# Patient Record
Sex: Male | Born: 1970 | Race: White | Hispanic: No | Marital: Single | Smoking: Former smoker
Health system: Southern US, Community
[De-identification: ages and names within clinical notes are randomized; demographics above are authoritative.]

## PROBLEM LIST (undated history)

## (undated) DIAGNOSIS — I10 Essential (primary) hypertension: Secondary | ICD-10-CM

## (undated) DIAGNOSIS — J449 Chronic obstructive pulmonary disease, unspecified: Secondary | ICD-10-CM

## (undated) DIAGNOSIS — I029 Rheumatic chorea without heart involvement: Secondary | ICD-10-CM

## (undated) DIAGNOSIS — I739 Peripheral vascular disease, unspecified: Secondary | ICD-10-CM

## (undated) DIAGNOSIS — J45909 Unspecified asthma, uncomplicated: Secondary | ICD-10-CM

## (undated) HISTORY — DX: Rheumatic chorea without heart involvement: I02.9

## (undated) HISTORY — DX: Essential (primary) hypertension: I10

## (undated) HISTORY — DX: Unspecified asthma, uncomplicated: J45.909

## (undated) HISTORY — DX: Peripheral vascular disease, unspecified: I73.9

## (undated) HISTORY — DX: Chronic obstructive pulmonary disease, unspecified: J44.9

## (undated) HISTORY — PX: FRACTURE SURGERY: SHX138

## (undated) HISTORY — PX: APPENDECTOMY: SHX54

---

## 2009-03-24 ENCOUNTER — Emergency Department (HOSPITAL_COMMUNITY): Admission: EM | Admit: 2009-03-24 | Discharge: 2009-03-24 | Payer: Self-pay | Admitting: Emergency Medicine

## 2012-11-27 ENCOUNTER — Ambulatory Visit: Payer: Self-pay | Admitting: Family Medicine

## 2013-08-19 ENCOUNTER — Ambulatory Visit: Payer: Self-pay | Admitting: Family Medicine

## 2013-08-20 ENCOUNTER — Ambulatory Visit (INDEPENDENT_AMBULATORY_CARE_PROVIDER_SITE_OTHER): Payer: 59 | Admitting: Family Medicine

## 2013-08-20 ENCOUNTER — Encounter: Payer: Self-pay | Admitting: Family Medicine

## 2013-08-20 VITALS — BP 115/62 | HR 96 | Temp 98.2°F | Ht 77.0 in | Wt 206.2 lb

## 2013-08-20 DIAGNOSIS — I10 Essential (primary) hypertension: Secondary | ICD-10-CM

## 2013-08-20 DIAGNOSIS — J45909 Unspecified asthma, uncomplicated: Secondary | ICD-10-CM

## 2013-08-20 DIAGNOSIS — J069 Acute upper respiratory infection, unspecified: Secondary | ICD-10-CM

## 2013-08-20 LAB — POCT URINALYSIS DIPSTICK
Bilirubin, UA: NEGATIVE
Blood, UA: NEGATIVE
Glucose, UA: NEGATIVE
Ketones, UA: NEGATIVE
Leukocytes, UA: NEGATIVE
Nitrite, UA: NEGATIVE
Protein, UA: NEGATIVE
Spec Grav, UA: 1.01
Urobilinogen, UA: NEGATIVE
pH, UA: 5

## 2013-08-20 LAB — POCT UA - MICROSCOPIC ONLY
Bacteria, U Microscopic: NEGATIVE
Casts, Ur, LPF, POC: NEGATIVE
Crystals, Ur, HPF, POC: NEGATIVE
Mucus, UA: NEGATIVE
RBC, urine, microscopic: NEGATIVE
WBC, Ur, HPF, POC: NEGATIVE
Yeast, UA: NEGATIVE

## 2013-08-20 MED ORDER — LISINOPRIL-HYDROCHLOROTHIAZIDE 20-12.5 MG PO TABS: 1.0000 | ORAL_TABLET | Freq: Every day | ORAL | Status: DC

## 2013-08-20 MED ORDER — AZITHROMYCIN 250 MG PO TABS: ORAL_TABLET | ORAL | Status: DC

## 2013-08-20 MED ORDER — FLUTICASONE-SALMETEROL 250-50 MCG/DOSE IN AEPB: 1.0000 | INHALATION_SPRAY | Freq: Two times a day (BID) | RESPIRATORY_TRACT | Status: DC

## 2013-08-20 NOTE — Progress Notes (Signed)
   Subjective:    Patient ID: Timothy Nielsen, male    DOB: September 19, 1970, 43 y.o.   MRN: [id number]  HPI This 43 y.o. male presents for evaluation of establishment visit.  He has hx of hypertension. He has hx of asthma.  He has been having cough and uri sx's.  He is a smoker. He states He was told by his prior PCP that he had some glucose in his urine but normal serum glucose Levels.   Review of Systems No chest pain, SOB, HA, dizziness, vision change, N/V, diarrhea, constipation, dysuria, urinary urgency or frequency, myalgias, arthralgias or rash.     Objective:   Physical Exam  Vital signs noted  Well developed well nourished male.  HEENT - Head atraumatic Normocephalic                Eyes - PERRLA, Conjuctiva - clear Sclera- Clear EOMI                Ears - EAC's Wnl TM's Wnl Gross Hearing WNL                Nose - Nares patent                 Throat - oropharanx wnl Respiratory - Lungs CTA bilateral Cardiac - RRR S1 and S2 without murmur GI - Abdomen soft Nontender and bowel sounds active x 4 Extremities - No edema. Neuro - Grossly intact.      Assessment & Plan:  Essential hypertension, benign - Plan: lisinopril-hydrochlorothiazide (PRINZIDE,ZESTORETIC) 20-12.5 MG per tablet, POCT UA - Microscopic Only, POCT urinalysis dipstick, BMP8+EGFR  Extrinsic asthma, unspecified - Plan: Fluticasone-Salmeterol (ADVAIR) 250-50 MCG/DOSE AEPB  URI (upper respiratory infection) - Plan: azithromycin (ZITHROMAX) 250 MG tablet  Tobacco abuse - Advised patient to stop smoking.  Hx of Glucosuria - Check UA  Lysbeth Penner FNP

## 2013-08-21 LAB — BMP8+EGFR
BUN/Creatinine Ratio: 14 (ref 9–20)
BUN: 12 mg/dL (ref 6–24)
CO2: 25 mmol/L (ref 18–29)
Calcium: 9.8 mg/dL (ref 8.7–10.2)
Chloride: 100 mmol/L (ref 97–108)
Creatinine, Ser: 0.88 mg/dL (ref 0.76–1.27)
GFR calc Af Amer: 121 mL/min/{1.73_m2} (ref >59)
GFR calc non Af Amer: 104 mL/min/{1.73_m2} (ref >59)
Glucose: 83 mg/dL (ref 65–99)
Potassium: 4.5 mmol/L (ref 3.5–5.2)
Sodium: 139 mmol/L (ref 134–144)

## 2013-08-23 ENCOUNTER — Other Ambulatory Visit: Payer: Self-pay | Admitting: Family Medicine

## 2013-08-23 ENCOUNTER — Telehealth: Payer: Self-pay | Admitting: Family Medicine

## 2013-08-23 MED ORDER — BECLOMETHASONE DIPROPIONATE 80 MCG/ACT IN AERS: 1.0000 | INHALATION_SPRAY | Freq: Once | RESPIRATORY_TRACT | Status: DC

## 2013-08-23 NOTE — Telephone Encounter (Signed)
Qvar sent to pharmacy and try this instead of the advair

## 2014-01-31 ENCOUNTER — Ambulatory Visit (INDEPENDENT_AMBULATORY_CARE_PROVIDER_SITE_OTHER): Payer: 59 | Admitting: Family Medicine

## 2014-01-31 ENCOUNTER — Encounter (INDEPENDENT_AMBULATORY_CARE_PROVIDER_SITE_OTHER): Payer: Self-pay

## 2014-01-31 ENCOUNTER — Ambulatory Visit (INDEPENDENT_AMBULATORY_CARE_PROVIDER_SITE_OTHER): Payer: 59

## 2014-01-31 ENCOUNTER — Encounter: Payer: Self-pay | Admitting: Family Medicine

## 2014-01-31 VITALS — BP 121/77 | HR 59 | Temp 96.0°F | Ht 77.0 in | Wt 205.0 lb

## 2014-01-31 DIAGNOSIS — J069 Acute upper respiratory infection, unspecified: Secondary | ICD-10-CM

## 2014-01-31 DIAGNOSIS — R05 Cough: Secondary | ICD-10-CM

## 2014-01-31 DIAGNOSIS — J45909 Unspecified asthma, uncomplicated: Secondary | ICD-10-CM

## 2014-01-31 DIAGNOSIS — J4530 Mild persistent asthma, uncomplicated: Secondary | ICD-10-CM

## 2014-01-31 DIAGNOSIS — R059 Cough, unspecified: Secondary | ICD-10-CM

## 2014-01-31 IMAGING — CR DG CHEST 2V
3 series · 3 of 3 positions shown · non-contrast
Comparison: None.

CLINICAL DATA: Cough, shortness of breath, history of asthma

EXAM:
CHEST  2 VIEW

[view not recorded (1 of 3)]
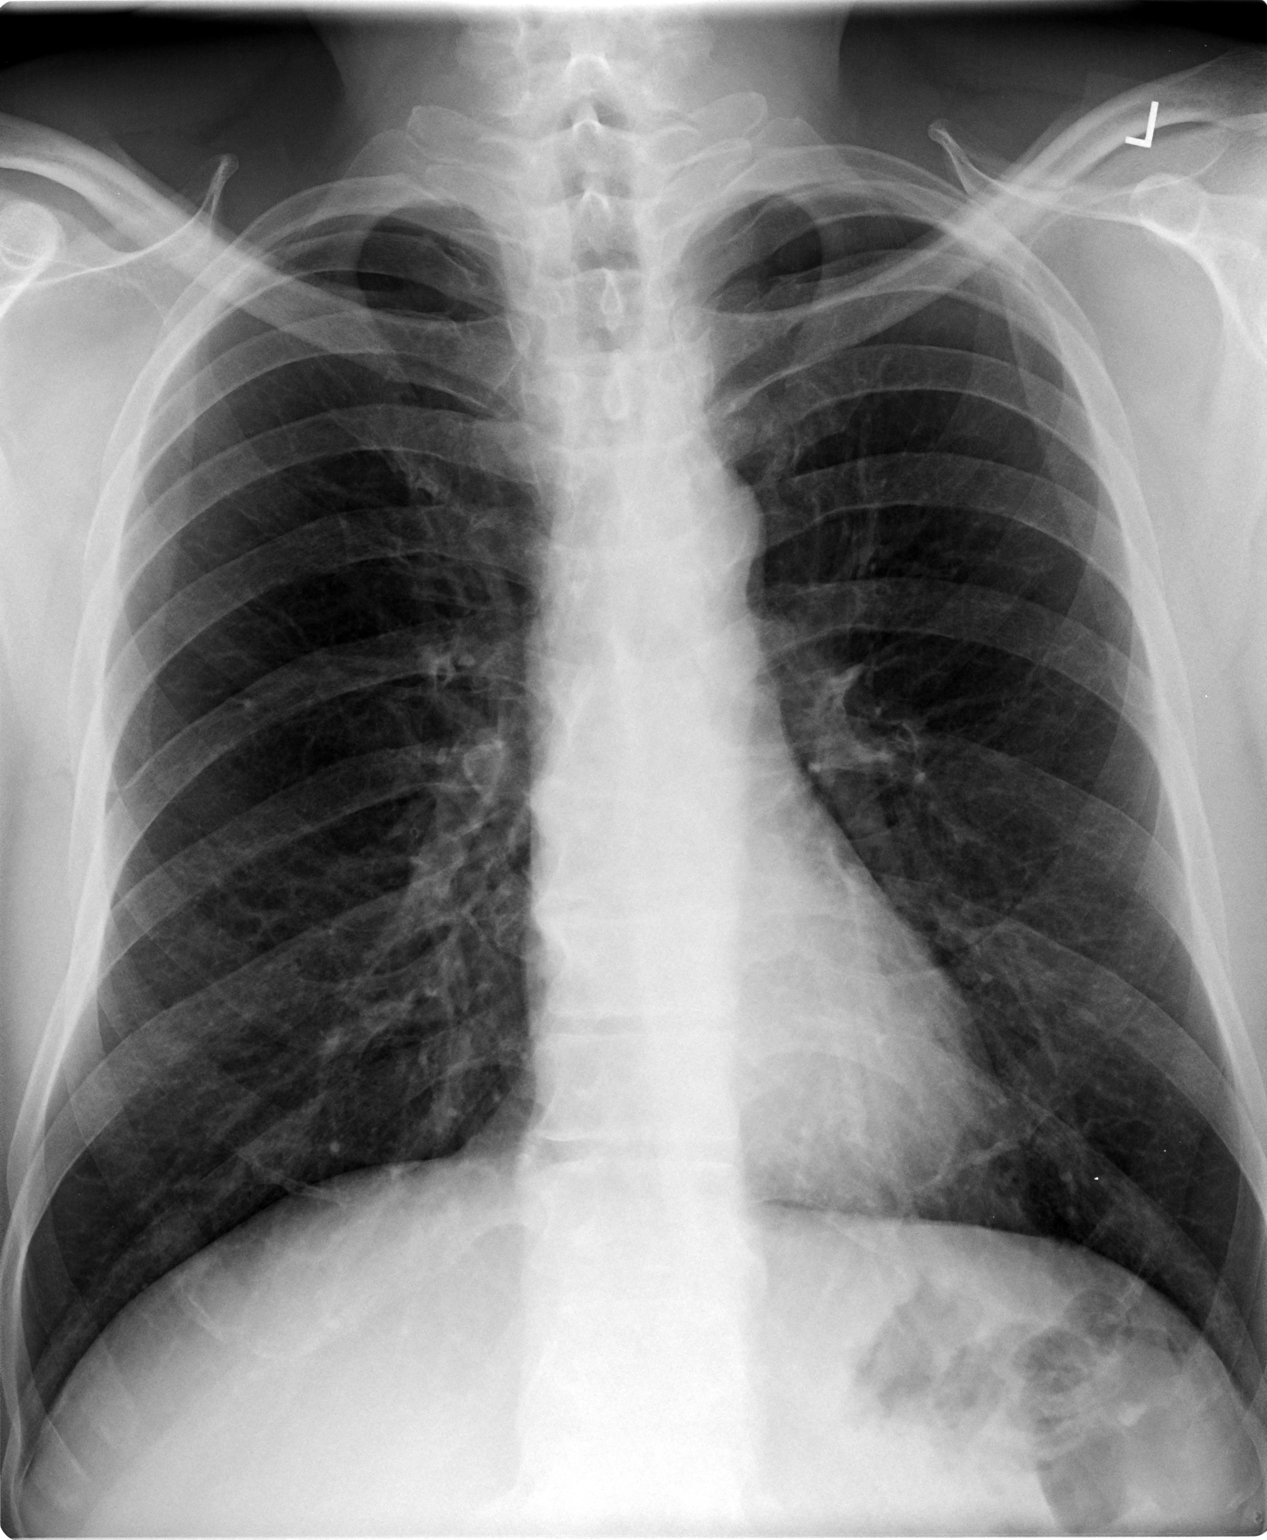

[view not recorded (2 of 3)]
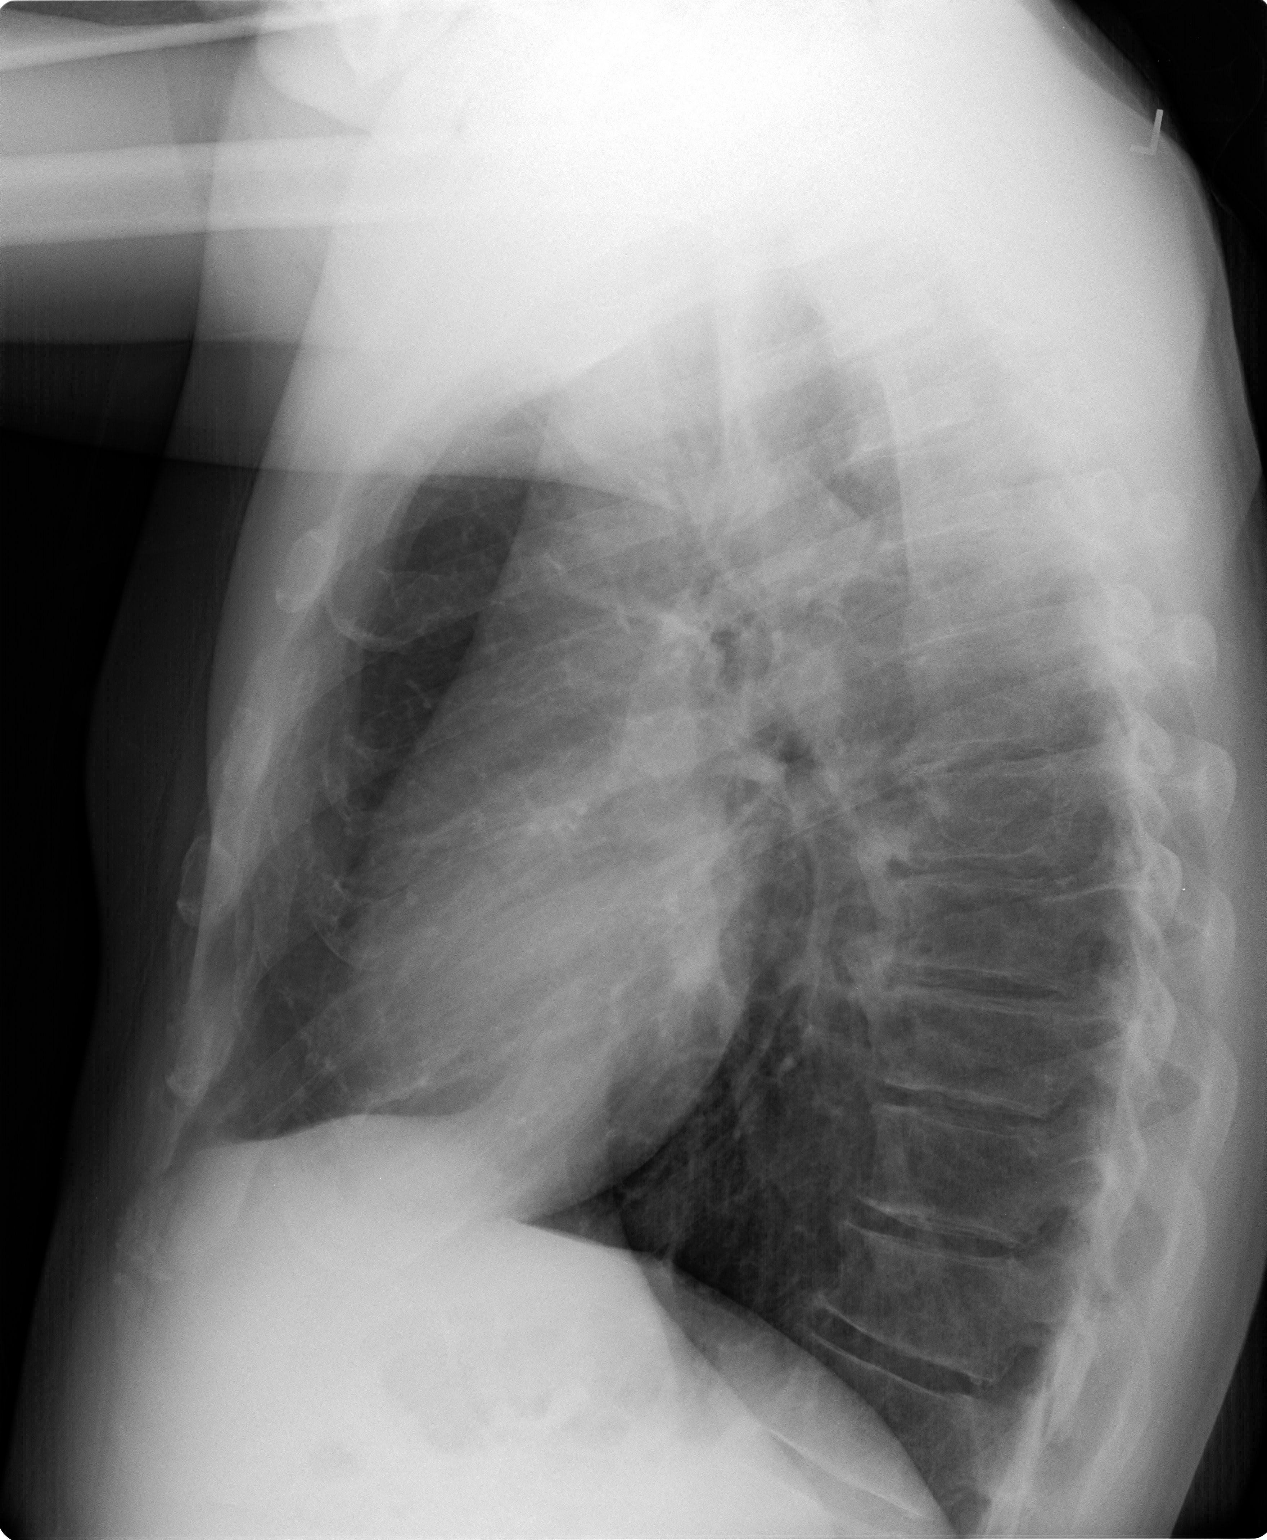

[view not recorded (3 of 3)]
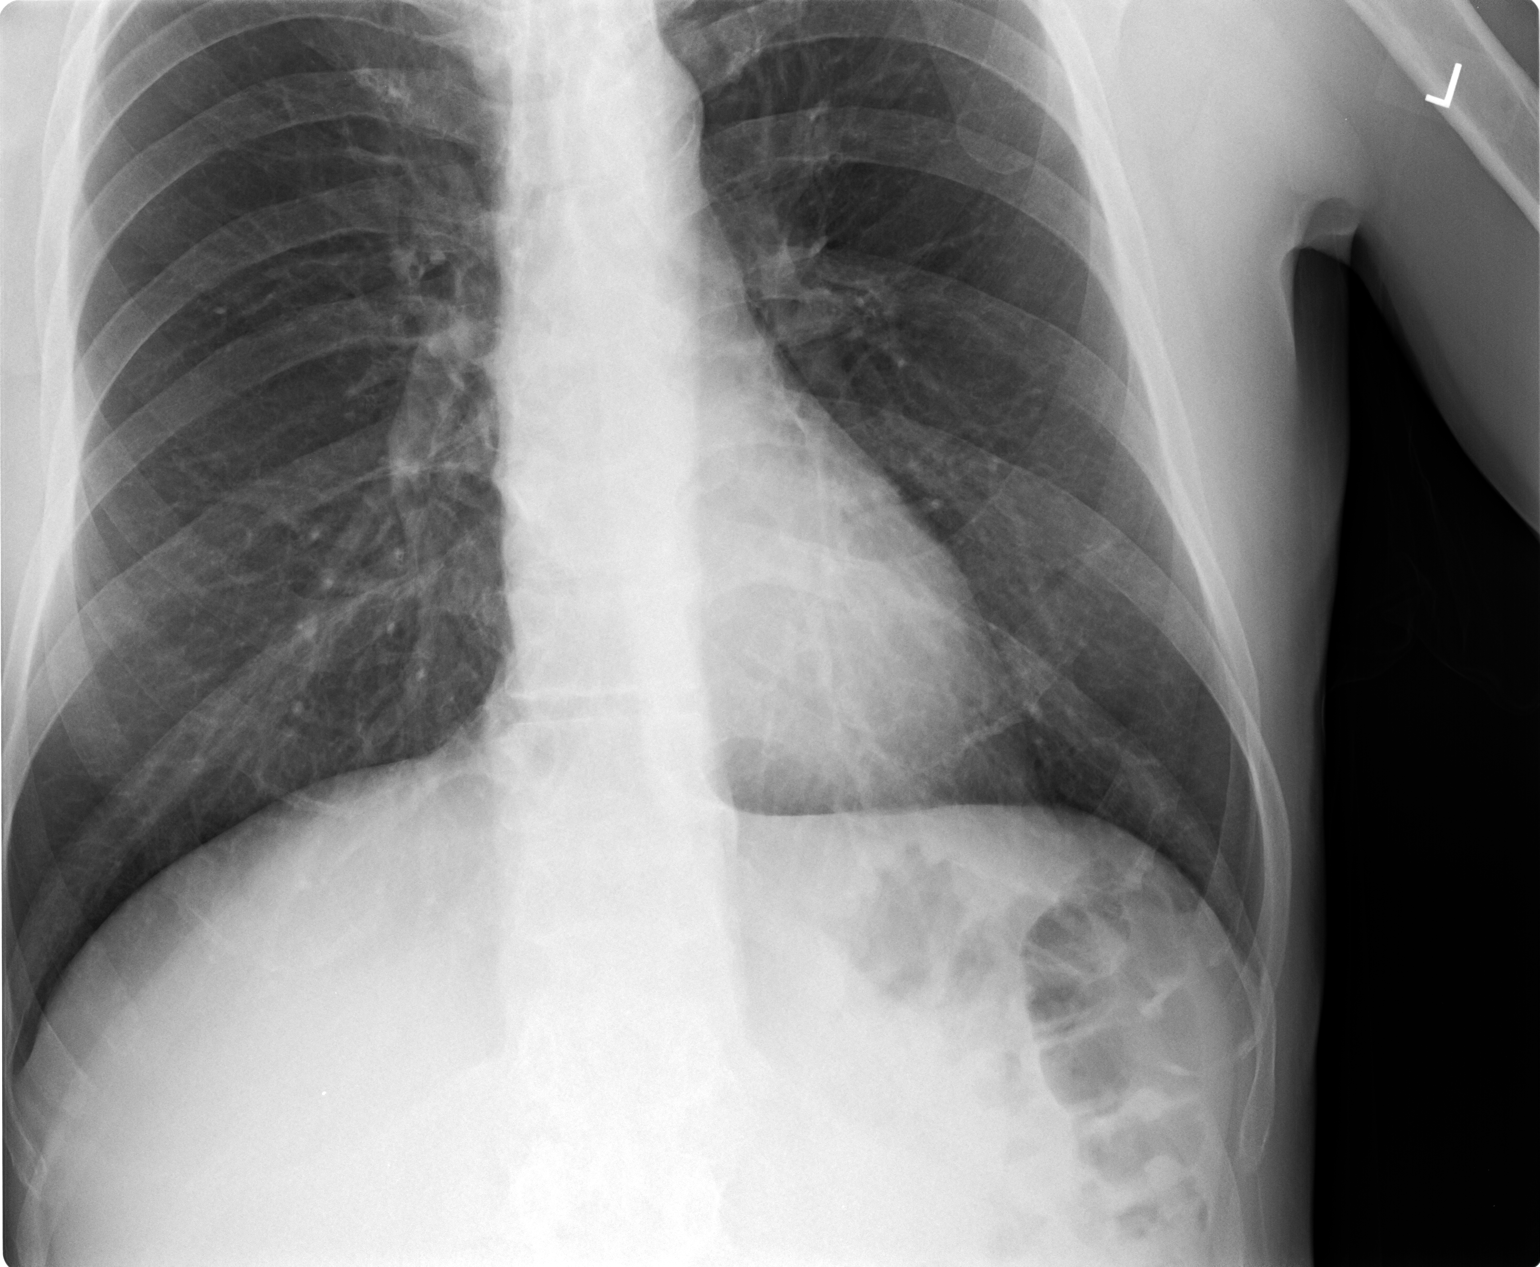

[3 of 3 positions shown; findings below may reference images not displayed]

FINDINGS: No active infiltrate or effusion is seen. Minimal peribronchial
thickening is noted. Mediastinal and hilar contours are
unremarkable. The heart is within normal limits in size. No bony
abnormality is seen.
IMPRESSION: No active infiltrate or effusion.  Mild peribronchial thickening.

## 2014-01-31 MED ORDER — MONTELUKAST SODIUM 10 MG PO TABS: 10.0000 mg | ORAL_TABLET | Freq: Every day | ORAL | Status: DC

## 2014-01-31 MED ORDER — AZITHROMYCIN 250 MG PO TABS: ORAL_TABLET | ORAL | Status: DC

## 2014-01-31 MED ORDER — METHYLPREDNISOLONE (PAK) 4 MG PO TABS: ORAL_TABLET | ORAL | Status: DC

## 2014-01-31 MED ORDER — BENZONATATE 100 MG PO CAPS: 100.0000 mg | ORAL_CAPSULE | Freq: Three times a day (TID) | ORAL | Status: DC | PRN

## 2014-01-31 MED ORDER — ALBUTEROL SULFATE HFA 108 (90 BASE) MCG/ACT IN AERS: 2.0000 | INHALATION_SPRAY | RESPIRATORY_TRACT | Status: DC | PRN

## 2014-01-31 NOTE — Progress Notes (Signed)
   Subjective:    Patient ID: Timothy Nielsen, male    DOB: 1971/06/13, 43 y.o.   MRN: [id number]  HPI  This 43 y.o. male presents for evaluation of shortness of breath.  He has been coughing and he has been wheezing and having a lot of night time coughing and wheezing.  Review of Systems C/o sob and cough   No chest pain, HA, dizziness, vision change, N/V, diarrhea, constipation, dysuria, urinary urgency or frequency, myalgias, arthralgias or rash.  Objective:   Physical Exam  Vital signs noted  Well developed well nourished male.  HEENT - Head atraumatic Normocephalic                Eyes - PERRLA, Conjuctiva - clear Sclera- Clear EOMI                Ears - EAC's Wnl TM's Wnl Gross Hearing WNL                Nose - Nares patent                 Throat - oropharanx wnl Respiratory - Lungs CTA bilateral Cardiac - RRR S1 and S2 without murmur GI - Abdomen soft Nontender and bowel sounds active x 4 Extremities - No edema. Neuro - Grossly intact.      Assessment & Plan:  Cough - Plan: DG Chest 2 View, montelukast (SINGULAIR) 10 MG tablet, azithromycin (ZITHROMAX) 250 MG tablet, methylPREDNIsolone (MEDROL DOSPACK) 4 MG tablet, albuterol (PROVENTIL HFA;VENTOLIN HFA) 108 (90 BASE) MCG/ACT inhaler  URI (upper respiratory infection) - Plan: montelukast (SINGULAIR) 10 MG tablet, azithromycin (ZITHROMAX) 250 MG tablet, methylPREDNIsolone (MEDROL DOSPACK) 4 MG tablet, albuterol (PROVENTIL HFA;VENTOLIN HFA) 108 (90 BASE) MCG/ACT inhaler, benzonatate (TESSALON PERLES) 100 MG capsule  Asthma, mild persistent, uncomplicated - Plan: montelukast (SINGULAIR) 10 MG tablet, azithromycin (ZITHROMAX) 250 MG tablet, methylPREDNIsolone (MEDROL DOSPACK) 4 MG tablet, albuterol (PROVENTIL HFA;VENTOLIN HFA) 108 (90 BASE) MCG/ACT inhaler  Push po fluids, rest, tylenol and motrin otc prn as directed for fever, arthralgias, and myalgias.  Follow up prn if sx's continue or persist.  Deatra CanterWilliam J Oxford FNP

## 2014-09-09 ENCOUNTER — Telehealth: Payer: Self-pay | Admitting: Family

## 2014-09-09 DIAGNOSIS — I10 Essential (primary) hypertension: Secondary | ICD-10-CM

## 2014-09-09 MED ORDER — LISINOPRIL-HYDROCHLOROTHIAZIDE 20-12.5 MG PO TABS: 1.0000 | ORAL_TABLET | Freq: Every day | ORAL | Status: DC

## 2014-09-09 NOTE — Telephone Encounter (Signed)
done

## 2014-09-29 ENCOUNTER — Ambulatory Visit (INDEPENDENT_AMBULATORY_CARE_PROVIDER_SITE_OTHER): Payer: Self-pay | Admitting: Family

## 2014-09-29 ENCOUNTER — Encounter: Payer: Self-pay | Admitting: Family

## 2014-09-29 VITALS — BP 127/84 | HR 87 | Temp 97.0°F | Ht 77.0 in | Wt 216.4 lb

## 2014-09-29 DIAGNOSIS — I10 Essential (primary) hypertension: Secondary | ICD-10-CM

## 2014-09-29 DIAGNOSIS — M5412 Radiculopathy, cervical region: Secondary | ICD-10-CM

## 2014-09-29 MED ORDER — KETOROLAC TROMETHAMINE 60 MG/2ML IM SOLN
60.0000 mg | Freq: Once | INTRAMUSCULAR | Status: AC
  Administered 2014-09-29: 60 mg via INTRAMUSCULAR

## 2014-09-29 MED ORDER — LISINOPRIL-HYDROCHLOROTHIAZIDE 20-12.5 MG PO TABS: 1.0000 | ORAL_TABLET | Freq: Every day | ORAL | Status: DC

## 2014-09-29 MED ORDER — METHYLPREDNISOLONE ACETATE 80 MG/ML IJ SUSP
80.0000 mg | Freq: Once | INTRAMUSCULAR | Status: AC
  Administered 2014-09-29: 80 mg via INTRAMUSCULAR

## 2014-09-29 NOTE — Progress Notes (Signed)
Subjective:    Patient ID: Timothy Nielsen, male    DOB: Apr 27, 1971, 44 y.o.   MRN: [id number]  Hypertension This is a chronic problem. The current episode started more than 1 year ago. The problem has been resolved since onset. The problem is controlled. Associated symptoms include neck pain. Pertinent negatives include no anxiety, headaches, palpitations, peripheral edema, PND or shortness of breath. Risk factors for coronary artery disease include male gender, obesity and smoking/tobacco exposure. Past treatments include ACE inhibitors and diuretics. The current treatment provides moderate improvement. There is no history of kidney disease, CAD/MI, CVA, heart failure or a thyroid problem. There is no history of sleep apnea.  Asthma There is no cough, hemoptysis, shortness of breath or wheezing. This is a chronic problem. The current episode started more than 1 year ago. The problem occurs rarely. The problem has been resolved. Pertinent negatives include no dyspnea on exertion, ear pain, headaches, myalgias, PND, sore throat or trouble swallowing. His symptoms are alleviated by rest. He reports significant improvement on treatment. His symptoms are not alleviated by rest. His past medical history is significant for asthma.  Neck Pain  This is a new problem. The current episode started 1 to 4 weeks ago. The problem occurs constantly. The problem has been unchanged. The pain is present in the left side. The quality of the pain is described as aching. The pain is at a severity of 5/10. The pain is moderate. The symptoms are aggravated by twisting. Pertinent negatives include no headaches or trouble swallowing. He has tried chiropractic manipulation for the symptoms. The treatment provided moderate relief.      Review of Systems  Constitutional: Negative.   HENT: Negative for ear pain, sore throat and trouble swallowing.   Respiratory: Negative.  Negative for cough, hemoptysis, shortness of breath and  wheezing.   Cardiovascular: Negative.  Negative for dyspnea on exertion, palpitations and PND.  Gastrointestinal: Negative.   Endocrine: Negative.   Genitourinary: Negative.   Musculoskeletal: Positive for neck pain. Negative for myalgias.  Neurological: Negative.  Negative for headaches.  Hematological: Negative.   Psychiatric/Behavioral: Negative.   All other systems reviewed and are negative.      Objective:   Physical Exam  Constitutional: He is oriented to person, place, and time. He appears well-developed and well-nourished. No distress.  HENT:  Head: Normocephalic.  Right Ear: External ear normal.  Left Ear: External ear normal.  Nose: Nose normal.  Mouth/Throat: Oropharynx is clear and moist.  Eyes: Pupils are equal, round, and reactive to light. Right eye exhibits no discharge. Left eye exhibits no discharge.  Neck: Normal range of motion. Neck supple. No thyromegaly present.  Cardiovascular: Normal rate, regular rhythm, normal heart sounds and intact distal pulses.   No murmur heard. Pulmonary/Chest: Effort normal and breath sounds normal. No respiratory distress. He has no wheezes.  Abdominal: Soft. Bowel sounds are normal. He exhibits no distension. There is no tenderness.  Musculoskeletal: Normal range of motion. He exhibits no edema or tenderness.  Neurological: He is alert and oriented to person, place, and time. He has normal reflexes. No cranial nerve deficit.  Skin: Skin is warm and dry. No rash noted. No erythema.  Psychiatric: He has a normal mood and affect. His behavior is normal. Judgment and thought content normal.  Vitals reviewed.     BP 127/84 mmHg  Pulse 87  Temp(Src) 97 F (36.1 C) (Oral)  Ht '6\' 5"'  (1.956 m)  Wt 216 lb 6.4 oz (98.158  kg)  BMI 25.66 kg/m2     Assessment & Plan:  1. Essential hypertension, benign -Dash diet information given -Exercise encouraged - Stress Management  -Continue current meds -RTO in 1 year -  lisinopril-hydrochlorothiazide (PRINZIDE,ZESTORETIC) 20-12.5 MG per tablet; Take 1 tablet by mouth daily.  Dispense: 90 tablet; Refill: 3 - BMP8+EGFR   3. Cervical radiculopathy -Rest -Continue with chiropractor  - BMP8+EGFR - ketorolac (TORADOL) injection 60 mg; Inject 2 mLs (60 mg total) into the muscle once. - methylPREDNISolone acetate (DEPO-MEDROL) injection 80 mg; Inject 1 mL (80 mg total) into the muscle once.   Continue all meds Labs pending Health Maintenance reviewed Diet and exercise encouraged RTO 1 year  Evelina Dun, FNP

## 2014-09-29 NOTE — Patient Instructions (Signed)

## 2015-11-14 ENCOUNTER — Telehealth: Payer: Self-pay | Admitting: Family

## 2015-11-14 DIAGNOSIS — I10 Essential (primary) hypertension: Secondary | ICD-10-CM

## 2015-11-14 MED ORDER — LISINOPRIL-HYDROCHLOROTHIAZIDE 20-12.5 MG PO TABS: 1.0000 | ORAL_TABLET | Freq: Every day | ORAL | Status: DC

## 2015-11-14 NOTE — Telephone Encounter (Signed)
Detailed message left for patient that rx as been filled and he needs to be seen.

## 2015-11-14 NOTE — Telephone Encounter (Signed)
Will refill for one month. PT needs appt!

## 2015-11-14 NOTE — Telephone Encounter (Signed)
Last seen 10/12/14

## 2016-06-16 ENCOUNTER — Encounter (HOSPITAL_COMMUNITY): Payer: Self-pay | Admitting: Emergency Medicine

## 2016-06-16 ENCOUNTER — Emergency Department (HOSPITAL_COMMUNITY)
Admission: EM | Admit: 2016-06-16 | Discharge: 2016-06-16 | Disposition: A | Discharge status: Home / Self Care | Payer: Self-pay | Attending: Emergency Medicine | Admitting: Emergency Medicine

## 2016-06-16 DIAGNOSIS — Z79899 Other long term (current) drug therapy: Secondary | ICD-10-CM | POA: Insufficient documentation

## 2016-06-16 DIAGNOSIS — H6500 Acute serous otitis media, unspecified ear: Secondary | ICD-10-CM

## 2016-06-16 DIAGNOSIS — H6592 Unspecified nonsuppurative otitis media, left ear: Secondary | ICD-10-CM | POA: Insufficient documentation

## 2016-06-16 DIAGNOSIS — I10 Essential (primary) hypertension: Secondary | ICD-10-CM | POA: Insufficient documentation

## 2016-06-16 DIAGNOSIS — F17210 Nicotine dependence, cigarettes, uncomplicated: Secondary | ICD-10-CM | POA: Insufficient documentation

## 2016-06-16 DIAGNOSIS — J45909 Unspecified asthma, uncomplicated: Secondary | ICD-10-CM | POA: Insufficient documentation

## 2016-06-16 MED ORDER — AMOXICILLIN 500 MG PO CAPS: 500.0000 mg | ORAL_CAPSULE | Freq: Three times a day (TID) | ORAL | 0 refills | Status: DC

## 2016-06-16 MED ORDER — HYDROCODONE-ACETAMINOPHEN 5-325 MG PO TABS: 1.0000 | ORAL_TABLET | Freq: Four times a day (QID) | ORAL | 0 refills | Status: DC | PRN

## 2016-06-16 MED ORDER — HYDROCODONE-ACETAMINOPHEN 5-325 MG PO TABS
1.0000 | ORAL_TABLET | Freq: Once | ORAL | Status: AC
  Administered 2016-06-16: 1 via ORAL
  Filled 2016-06-16: qty 1

## 2016-06-16 NOTE — ED Triage Notes (Signed)
Started with cold symptoms this pass Wed.  Tried OTC meds with not relief.  Here today c/o left ear pain, rating pain 10/10.  Denies any fever or chills.

## 2016-06-16 NOTE — Discharge Instructions (Signed)
Follow up with your md next week. °

## 2016-06-16 NOTE — ED Provider Notes (Signed)
[facility]Y [facility]PT Provider Note   CSN: [id number] Arrival date & time: 06/16/16  1444     History   Chief Complaint Chief Complaint  Patient presents with  . Otalgia    HPI Timothy Nielsen is a 45 y.o. male.   Pt complains of an earache in left ear for 2 days.  Also cold symptoms   The history is provided by the patient. No language interpreter was used.  Otalgia  This is a new problem. The current episode started yesterday. There is pain in the left ear. The problem occurs constantly. The problem has not changed since onset.There has been no fever. The pain is at a severity of 7/10. The pain is moderate. Pertinent negatives include no ear discharge, no headaches, no abdominal pain, no diarrhea, no cough and no rash.    Past Medical History:  Diagnosis Date  . Asthma   . Hypertension     Patient Active Problem List   Diagnosis Date Noted  . Essential hypertension 09/29/2014    Past Surgical History:  Procedure Laterality Date  . APPENDECTOMY    . FRACTURE SURGERY         Home Medications    Prior to Admission medications   Medication Sig Start Date End Date Taking? Authorizing Provider  albuterol (PROVENTIL HFA;VENTOLIN HFA) 108 (90 BASE) MCG/ACT inhaler Inhale 2 puffs into the lungs every 4 (four) hours as needed for wheezing or shortness of breath. Patient not taking: Reported on 09/29/2014 01/31/14   Deatra CanterWilliam J Oxford, FNP  amoxicillin (AMOXIL) 500 MG capsule Take 1 capsule (500 mg total) by mouth 3 (three) times daily. 06/16/16   Bethann BerkshireJoseph Annaleah Arata, MD  beclomethasone (QVAR) 80 MCG/ACT inhaler Inhale 1 puff into the lungs once. Patient not taking: Reported on 09/29/2014 08/23/13   Deatra CanterWilliam J Oxford, FNP  HYDROcodone-acetaminophen (NORCO/VICODIN) 5-325 MG tablet Take 1 tablet by mouth every 6 (six) hours as needed for moderate pain. 06/16/16   Bethann BerkshireJoseph Chevis Weisensel, MD  lisinopril-hydrochlorothiazide (PRINZIDE,ZESTORETIC) 20-12.5 MG tablet Take 1 tablet by mouth daily. 11/14/15    Junie Spencerhristy A Hawks, FNP  montelukast (SINGULAIR) 10 MG tablet Take 1 tablet (10 mg total) by mouth at bedtime. Patient not taking: Reported on 09/29/2014 01/31/14   Deatra CanterWilliam J Oxford, FNP    Family History Family History  Problem Relation Age of Onset  . Diabetes Mother   . Hypertension Mother   . Diabetes Father     Social History Social History  Substance Use Topics  . Smoking status: Current Every Day Smoker    Packs/day: 1.00    Types: Cigarettes    Start date: 11/14/1986  . Smokeless tobacco: Not on file  . Alcohol use 9.0 oz/week    18 Standard drinks or equivalent per week     Allergies   Patient has no known allergies.   Review of Systems Review of Systems  Constitutional: Negative for appetite change and fatigue.  HENT: Positive for ear pain. Negative for congestion, ear discharge and sinus pressure.        Runny nose  Eyes: Negative for discharge.  Respiratory: Negative for cough.   Cardiovascular: Negative for chest pain.  Gastrointestinal: Negative for abdominal pain and diarrhea.  Genitourinary: Negative for frequency and hematuria.  Musculoskeletal: Negative for back pain.  Skin: Negative for rash.  Neurological: Negative for seizures and headaches.  Psychiatric/Behavioral: Negative for hallucinations.     Physical Exam Updated Vital Signs BP 153/77 (BP Location: Left Arm)   Pulse 86  Temp 98 F (36.7 C) (Oral)   Resp 19   Ht 6\' 6"  (1.981 m)   Wt 250 lb (113.4 kg)   SpO2 99%   BMI 28.89 kg/m   Physical Exam  Constitutional: He is oriented to person, place, and time. He appears well-developed.  HENT:  Head: Normocephalic.  Left TM inflamed  Eyes: Conjunctivae are normal.  Neck: No tracheal deviation present.  Cardiovascular:  No murmur heard. Musculoskeletal: Normal range of motion.  Neurological: He is oriented to person, place, and time.  Skin: Skin is warm.  Psychiatric: He has a normal mood and affect.     ED Treatments / Results    Labs (all labs ordered are listed, but only abnormal results are displayed) Labs Reviewed - No data to display  EKG  EKG Interpretation None       Radiology No results found.  Procedures Procedures (including critical care time)  Medications Ordered in ED Medications  HYDROcodone-acetaminophen (NORCO/VICODIN) 5-325 MG per tablet 1 tablet (not administered)     Initial Impression / Assessment and Plan / ED Course  I have reviewed the triage vital signs and the nursing notes.  Pertinent labs & imaging results that were available during my care of the patient were reviewed by me and considered in my medical decision making (see chart for details).  Clinical Course     Left otitis media. Patient will be treated with amoxicillin and Vicodin  Final Clinical Impressions(s) / ED Diagnoses   Final diagnoses:  Acute serous otitis media, recurrence not specified, unspecified laterality    New Prescriptions New Prescriptions   AMOXICILLIN (AMOXIL) 500 MG CAPSULE    Take 1 capsule (500 mg total) by mouth 3 (three) times daily.   HYDROCODONE-ACETAMINOPHEN (NORCO/VICODIN) 5-325 MG TABLET    Take 1 tablet by mouth every 6 (six) hours as needed for moderate pain.     Bethann BerkshireJoseph Christien Berthelot, MD 06/16/16 [phone]

## 2016-11-21 ENCOUNTER — Ambulatory Visit (INDEPENDENT_AMBULATORY_CARE_PROVIDER_SITE_OTHER): Payer: Self-pay | Admitting: Family

## 2016-11-21 ENCOUNTER — Other Ambulatory Visit: Payer: Self-pay | Admitting: Family

## 2016-11-21 ENCOUNTER — Ambulatory Visit (INDEPENDENT_AMBULATORY_CARE_PROVIDER_SITE_OTHER): Payer: Self-pay

## 2016-11-21 ENCOUNTER — Encounter: Payer: Self-pay | Admitting: Family

## 2016-11-21 VITALS — BP 109/65 | HR 77 | Temp 97.7°F | Ht 78.0 in | Wt 214.8 lb

## 2016-11-21 DIAGNOSIS — J45909 Unspecified asthma, uncomplicated: Secondary | ICD-10-CM | POA: Insufficient documentation

## 2016-11-21 DIAGNOSIS — F17200 Nicotine dependence, unspecified, uncomplicated: Secondary | ICD-10-CM | POA: Insufficient documentation

## 2016-11-21 DIAGNOSIS — K219 Gastro-esophageal reflux disease without esophagitis: Secondary | ICD-10-CM

## 2016-11-21 DIAGNOSIS — I10 Essential (primary) hypertension: Secondary | ICD-10-CM

## 2016-11-21 DIAGNOSIS — R062 Wheezing: Secondary | ICD-10-CM

## 2016-11-21 DIAGNOSIS — J449 Chronic obstructive pulmonary disease, unspecified: Secondary | ICD-10-CM

## 2016-11-21 DIAGNOSIS — I700 Atherosclerosis of aorta: Secondary | ICD-10-CM

## 2016-11-21 DIAGNOSIS — R079 Chest pain, unspecified: Secondary | ICD-10-CM

## 2016-11-21 IMAGING — DX DG CHEST 2V
3 series · 3 of 3 positions shown · non-contrast
Comparison: [DATE]

CLINICAL DATA: Chest pain

EXAM:
CHEST  2 VIEW

[chest pa (1 of 2)]
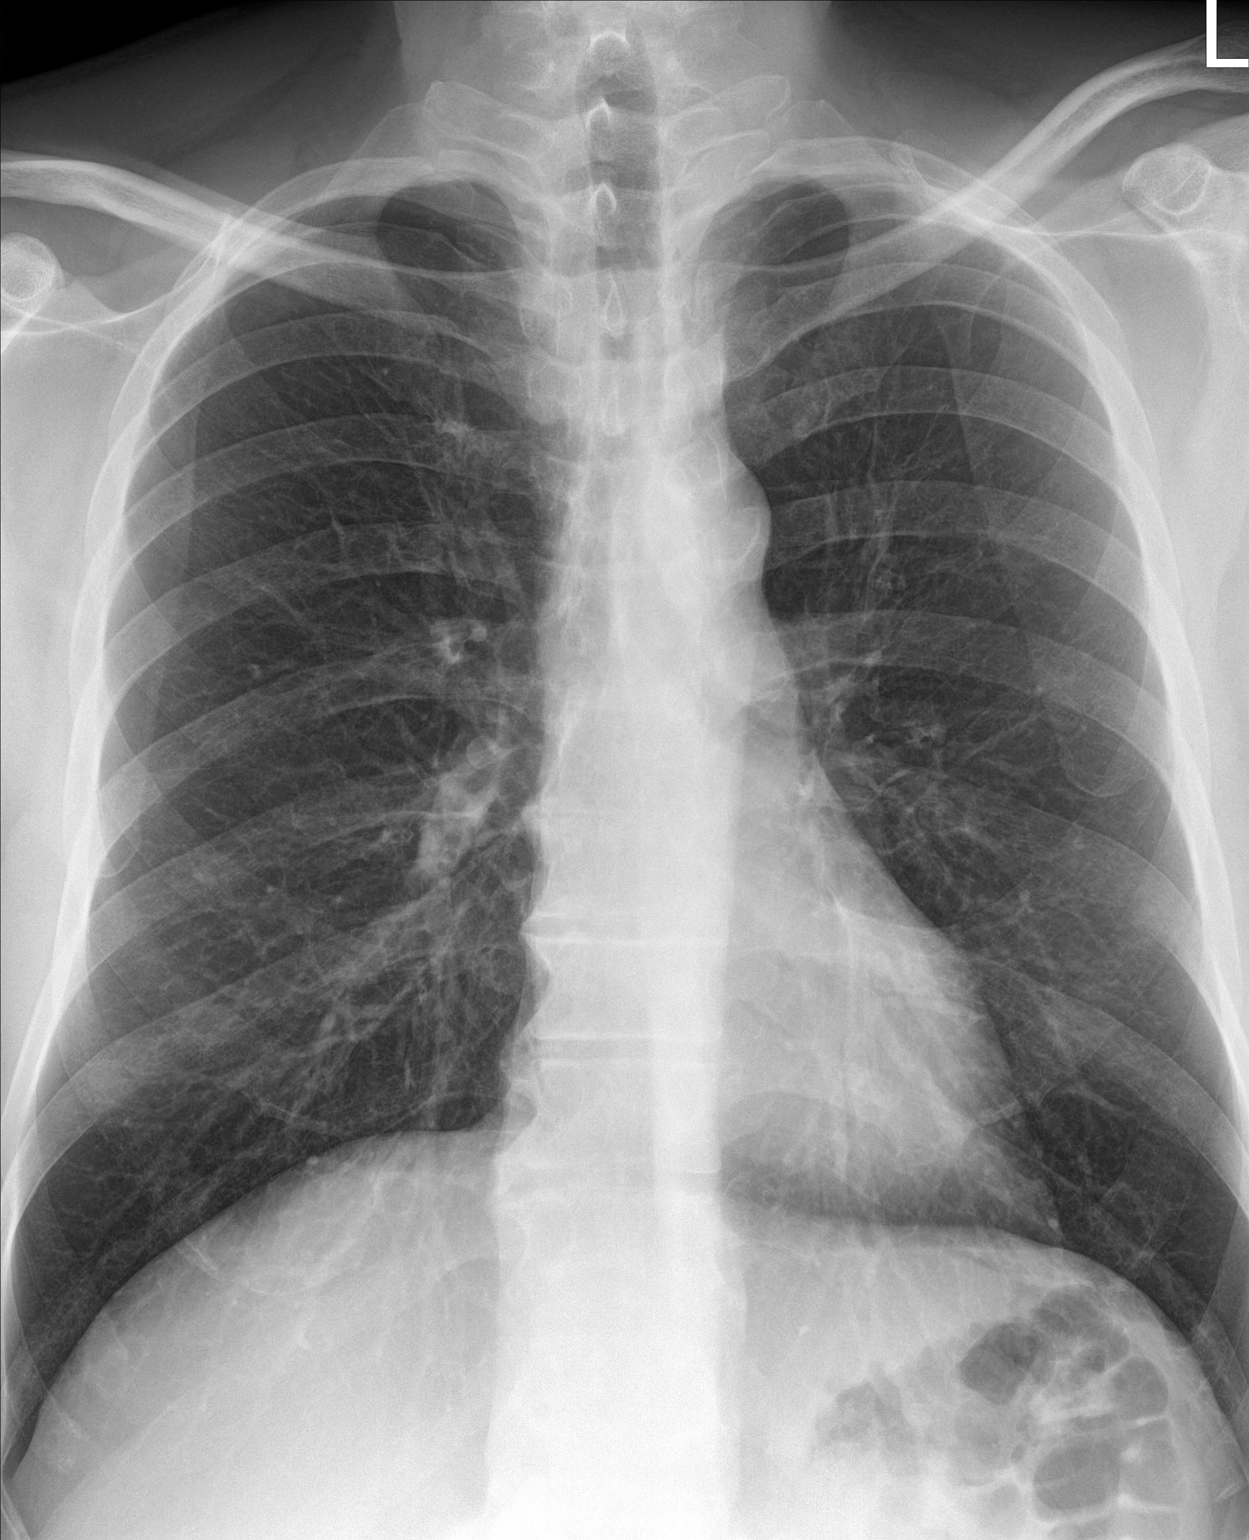

[chest lat]
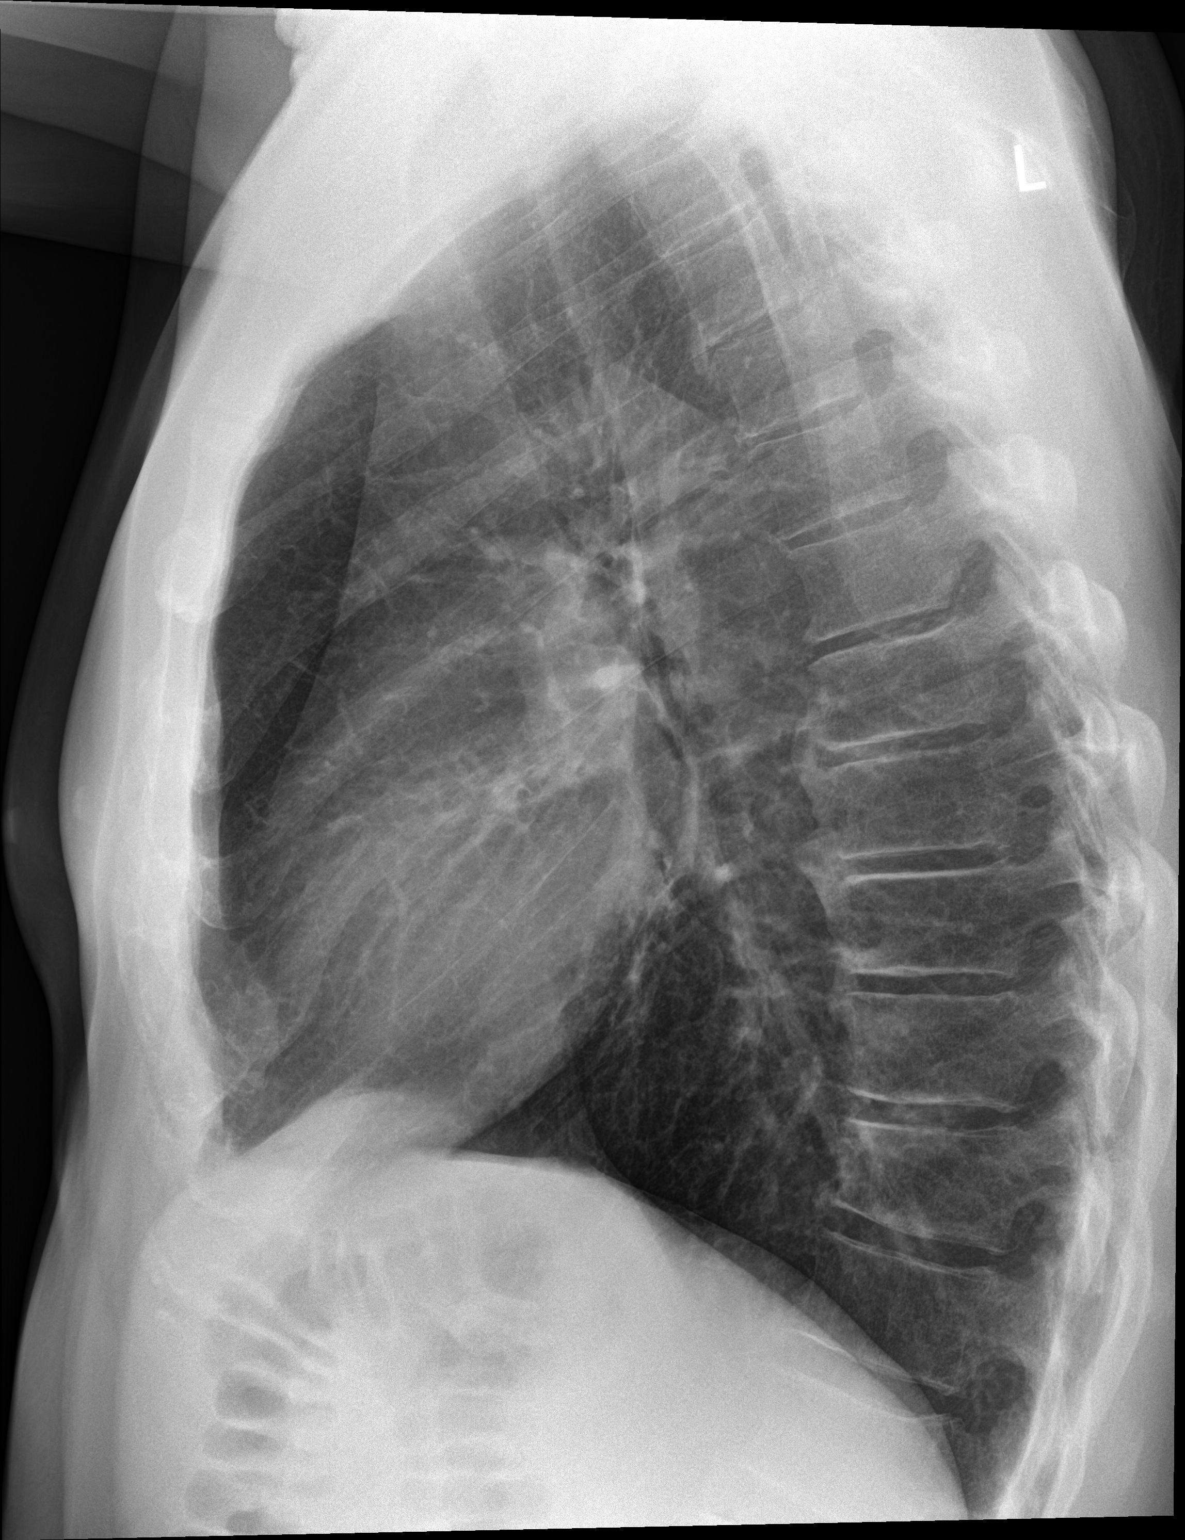

[chest pa (2 of 2)]
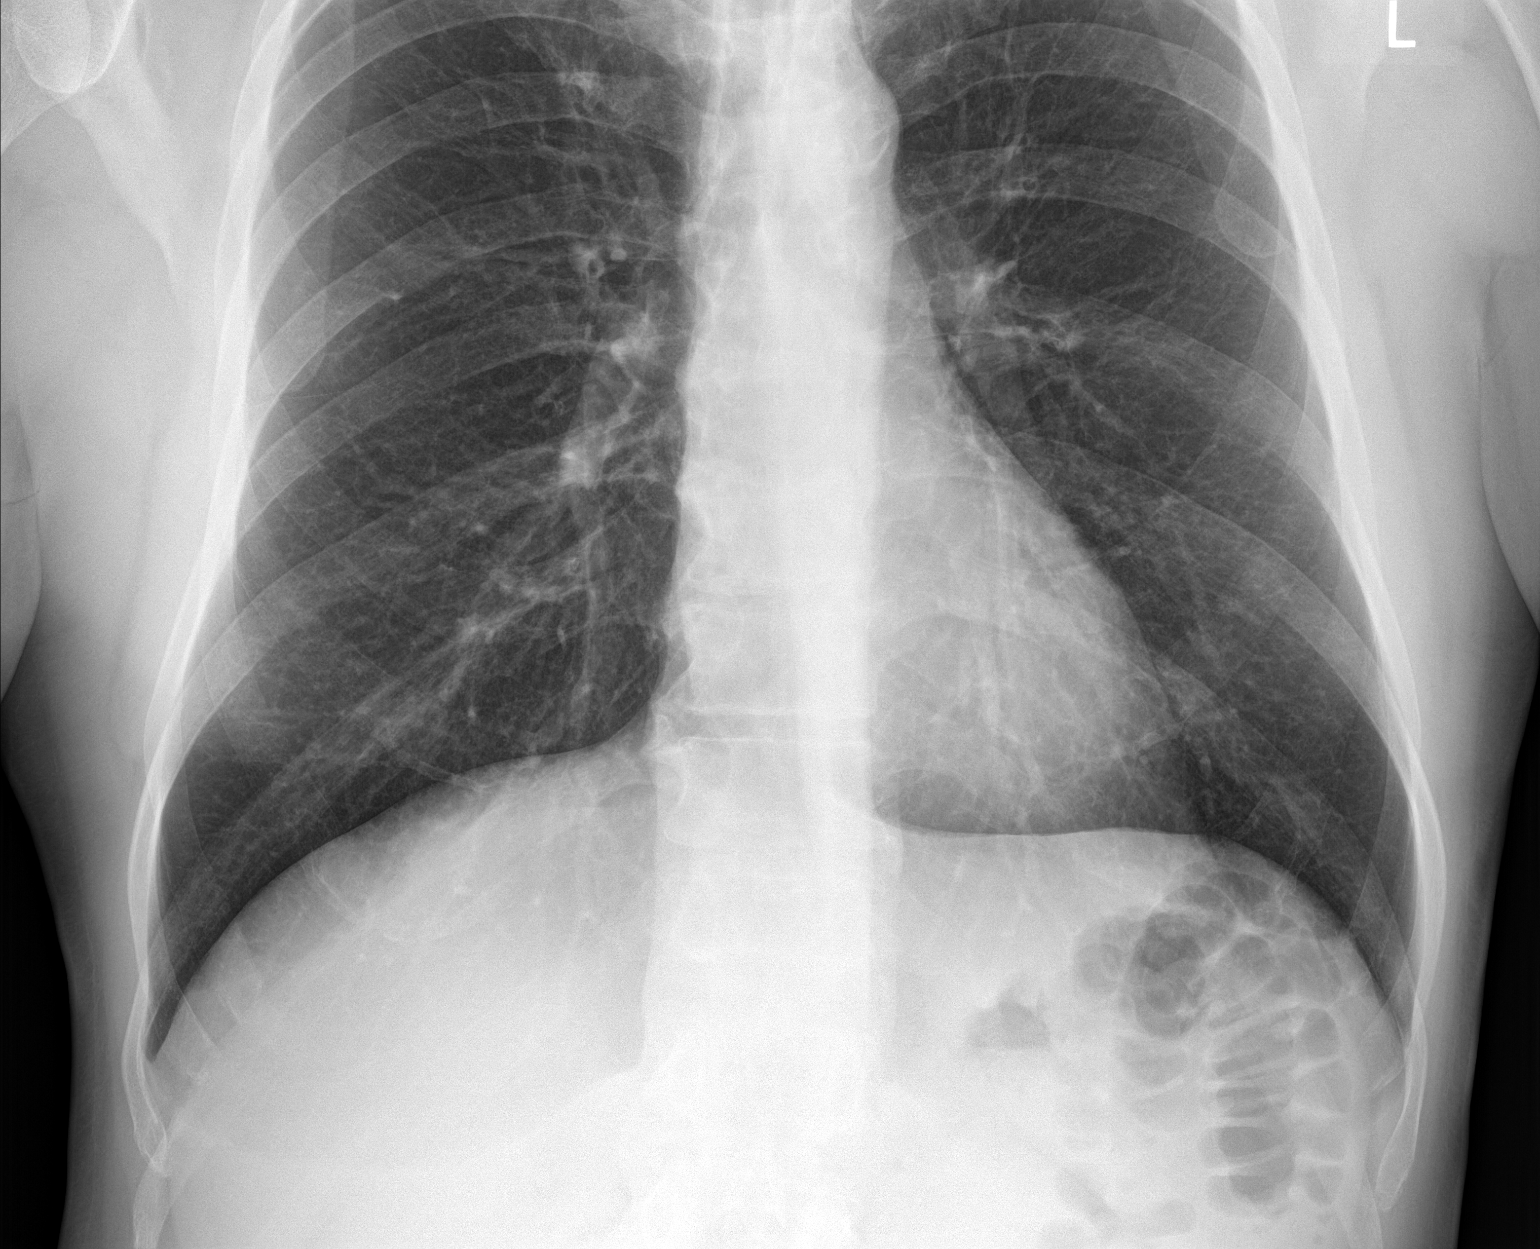

[3 of 3 positions shown; findings below may reference images not displayed]

FINDINGS: There is no edema or consolidation. The heart size and pulmonary
vascularity are normal. No adenopathy. There is aortic
atherosclerosis. There is degenerative change in the thoracic
region. No pneumothorax.
IMPRESSION: No edema or consolidation.  Aortic atherosclerosis.

## 2016-11-21 MED ORDER — LISINOPRIL-HYDROCHLOROTHIAZIDE 20-12.5 MG PO TABS: 1.0000 | ORAL_TABLET | Freq: Every day | ORAL | 3 refills | Status: DC

## 2016-11-21 MED ORDER — OMEPRAZOLE 20 MG PO CPDR: 20.0000 mg | DELAYED_RELEASE_CAPSULE | Freq: Every day | ORAL | 3 refills | Status: DC

## 2016-11-21 MED ORDER — BUDESONIDE-FORMOTEROL FUMARATE 80-4.5 MCG/ACT IN AERO: 2.0000 | INHALATION_SPRAY | Freq: Two times a day (BID) | RESPIRATORY_TRACT | 3 refills | Status: DC

## 2016-11-21 MED ORDER — ALBUTEROL SULFATE HFA 108 (90 BASE) MCG/ACT IN AERS: 2.0000 | INHALATION_SPRAY | RESPIRATORY_TRACT | 11 refills | Status: DC | PRN

## 2016-11-21 MED ORDER — FLUTICASONE FUROATE-VILANTEROL 100-25 MCG/INH IN AEPB: 1.0000 | INHALATION_SPRAY | Freq: Every day | RESPIRATORY_TRACT | 11 refills | Status: DC

## 2016-11-21 NOTE — Progress Notes (Signed)
Subjective:    Patient ID: Timothy Nielsen, male    DOB: 07-Apr-1971, 46 y.o.   MRN: [id number]  Pt presents to the office today for chronic follow up.  Shortness of Breath  This is a new problem. The current episode started 1 to 4 weeks ago. The problem occurs intermittently. Associated symptoms include chest pain. There is no history of a heart failure.  Gastroesophageal Reflux  He complains of chest pain, heartburn and a hoarse voice. This is a new problem. The current episode started more than 1 month ago. The problem occurs constantly. The heartburn is of severe intensity. The heartburn wakes him from sleep. The heartburn limits his activity. The symptoms are aggravated by certain foods. Risk factors include smoking/tobacco exposure. He has tried an antacid for the symptoms. The treatment provided mild relief.  Hypertension  This is a chronic problem. The current episode started more than 1 year ago. The problem has been resolved since onset. The problem is controlled. Associated symptoms include chest pain, malaise/fatigue and shortness of breath. Pertinent negatives include no peripheral edema. Past treatments include ACE inhibitors and diuretics. The current treatment provides moderate improvement. There is no history of kidney disease, CAD/MI, CVA or heart failure.      Review of Systems  Constitutional: Positive for malaise/fatigue.  HENT: Positive for hoarse voice.   Respiratory: Positive for shortness of breath.   Cardiovascular: Positive for chest pain.  Gastrointestinal: Positive for heartburn.  All other systems reviewed and are negative.      Objective:   Physical Exam  Constitutional: He is oriented to person, place, and time. He appears well-developed and well-nourished. No distress.  HENT:  Head: Normocephalic.  Right Ear: External ear normal.  Left Ear: External ear normal.  Mouth/Throat: Oropharynx is clear and moist.  Eyes: Pupils are equal, round, and reactive to  light. Right eye exhibits no discharge. Left eye exhibits no discharge.  Neck: Normal range of motion. Neck supple. No thyromegaly present.  Cardiovascular: Normal rate, regular rhythm, normal heart sounds and intact distal pulses.   No murmur heard. Pulmonary/Chest: Effort normal. No respiratory distress. He has wheezes.  Abdominal: Soft. Bowel sounds are normal. He exhibits no distension. There is no tenderness.  Musculoskeletal: Normal range of motion. He exhibits no edema or tenderness.  Neurological: He is alert and oriented to person, place, and time.  Skin: Skin is warm and dry. No rash noted. No erythema.  Psychiatric: He has a normal mood and affect. His behavior is normal. Judgment and thought content normal.  Vitals reviewed.    EKG- Sinus  Rhythm   BP 109/65   Pulse 77   Temp 97.7 F (36.5 C) (Oral)   Ht '6\' 6"'  (1.981 m)   Wt 214 lb 12.8 oz (97.4 kg)   BMI 24.82 kg/m      Assessment & Plan:  1. Current smoker - CMP14+EGFR - Lipid panel  2. Gastroesophageal reflux disease, esophagitis presence not specified --Diet discussed- Avoid fried, spicy, citrus foods, caffeine and alcohol -Do not eat 2-3 hours before bedtime -Encouraged small frequent meals -Avoid NSAID's - CMP14+EGFR - omeprazole (PRILOSEC) 20 MG capsule; Take 1 capsule (20 mg total) by mouth daily.  Dispense: 30 capsule; Refill: 3  3. Chronic obstructive pulmonary disease, unspecified COPD type (HCC) - CMP14+EGFR - albuterol (PROVENTIL HFA;VENTOLIN HFA) 108 (90 Base) MCG/ACT inhaler; Inhale 2 puffs into the lungs every 4 (four) hours as needed for wheezing or shortness of breath.  Dispense: 1 Inhaler;  Refill: 11 - budesonide-formoterol (SYMBICORT) 80-4.5 MCG/ACT inhaler; Inhale 2 puffs into the lungs 2 (two) times daily.  Dispense: 1 Inhaler; Refill: 3  4. Wheezing - CMP14+EGFR - albuterol (PROVENTIL HFA;VENTOLIN HFA) 108 (90 Base) MCG/ACT inhaler; Inhale 2 puffs into the lungs every 4 (four) hours as  needed for wheezing or shortness of breath.  Dispense: 1 Inhaler; Refill: 11 - budesonide-formoterol (SYMBICORT) 80-4.5 MCG/ACT inhaler; Inhale 2 puffs into the lungs 2 (two) times daily.  Dispense: 1 Inhaler; Refill: 3  5. Chest pain, unspecified type - CMP14+EGFR - Lipid panel - DG Chest 2 View; Future - EKG 12-Lead  6. Essential hypertension, benign - lisinopril-hydrochlorothiazide (PRINZIDE,ZESTORETIC) 20-12.5 MG tablet; Take 1 tablet by mouth daily.  Dispense: 90 tablet; Refill: 3   Continue all meds Labs pending Health Maintenance reviewed Diet and exercise encouraged RTO 6 months   Evelina Dun, FNP

## 2016-11-21 NOTE — Patient Instructions (Signed)
Heartburn Heartburn is a type of pain or discomfort that can happen in the throat or chest. It is often described as a burning pain. It may also cause a bad taste in the mouth. Heartburn may feel worse when you lie down or bend over, and it is often worse at night. Heartburn may be caused by stomach contents that move back up into the esophagus (reflux). Follow these instructions at home: Take these actions to decrease your discomfort and to help avoid complications. Diet  Follow a diet as recommended by your health care provider. This may involve avoiding foods and drinks such as: ? Coffee and tea (with or without caffeine). ? Drinks that contain alcohol. ? Energy drinks and sports drinks. ? Carbonated drinks or sodas. ? Chocolate and cocoa. ? Peppermint and mint flavorings. ? Garlic and onions. ? Horseradish. ? Spicy and acidic foods, including peppers, chili powder, curry powder, vinegar, hot sauces, and barbecue sauce. ? Citrus fruit juices and citrus fruits, such as oranges, lemons, and limes. ? Tomato-based foods, such as red sauce, chili, salsa, and pizza with red sauce. ? Fried and fatty foods, such as donuts, french fries, potato chips, and high-fat dressings. ? High-fat meats, such as hot dogs and fatty cuts of red and white meats, such as rib eye steak, sausage, ham, and bacon. ? High-fat dairy items, such as whole milk, butter, and cream cheese.  Eat small, frequent meals instead of large meals.  Avoid drinking large amounts of liquid with your meals.  Avoid eating meals during the 2-3 hours before bedtime.  Avoid lying down right after you eat.  Do not exercise right after you eat. General instructions  Pay attention to any changes in your symptoms.  Take over-the-counter and prescription medicines only as told by your health care provider. Do not take aspirin, ibuprofen, or other NSAIDs unless your health care provider told you to do so.  Do not use any tobacco  products, including cigarettes, chewing tobacco, and e-cigarettes. If you need help quitting, ask your health care provider.  Wear loose-fitting clothing. Do not wear anything tight around your waist that causes pressure on your abdomen.  Raise (elevate) the head of your bed about 6 inches (15 cm).  Try to reduce your stress, such as with yoga or meditation. If you need help reducing stress, ask your health care provider.  If you are overweight, reduce your weight to an amount that is healthy for you. Ask your health care provider for guidance about a safe weight loss goal.  Keep all follow-up visits as told by your health care provider. This is important. Contact a health care provider if:  You have new symptoms.  You have unexplained weight loss.  You have difficulty swallowing, or it hurts to swallow.  You have wheezing or a persistent cough.  Your symptoms do not improve with treatment.  You have frequent heartburn for more than two weeks. Get help right away if:  You have pain in your arms, neck, jaw, teeth, or back.  You feel sweaty, dizzy, or light-headed.  You have chest pain or shortness of breath.  You vomit and your vomit looks like blood or coffee grounds.  Your stool is bloody or black. This information is not intended to replace advice given to you by your health care provider. Make sure you discuss any questions you have with your health care provider. Document Released: 10/27/2008 Document Revised: 11/16/2015 Document Reviewed: 10/05/2014 [employer] Interactive Patient Education  2017 [employer]  

## 2016-11-22 LAB — CMP14+EGFR
ALK PHOS: 85 IU/L (ref 39–117)
ALT: 31 IU/L (ref 0–44)
AST: 28 IU/L (ref 0–40)
Albumin/Globulin Ratio: 2.1 (ref 1.2–2.2)
Albumin: 4.6 g/dL (ref 3.5–5.5)
BUN/Creatinine Ratio: 11 (ref 9–20)
BUN: 11 mg/dL (ref 6–24)
Bilirubin Total: 0.5 mg/dL (ref 0.0–1.2)
CALCIUM: 9.6 mg/dL (ref 8.7–10.2)
CO2: 24 mmol/L (ref 18–29)
CREATININE: 0.96 mg/dL (ref 0.76–1.27)
Chloride: 98 mmol/L (ref 96–106)
GFR calc Af Amer: 108 mL/min/{1.73_m2} (ref >59)
GFR, EST NON AFRICAN AMERICAN: 93 mL/min/{1.73_m2} (ref >59)
GLUCOSE: 82 mg/dL (ref 65–99)
Globulin, Total: 2.2 g/dL (ref 1.5–4.5)
POTASSIUM: 4.6 mmol/L (ref 3.5–5.2)
Sodium: 136 mmol/L (ref 134–144)
Total Protein: 6.8 g/dL (ref 6.0–8.5)

## 2016-11-22 LAB — LIPID PANEL
CHOL/HDL RATIO: 3.2 ratio (ref 0.0–5.0)
CHOLESTEROL TOTAL: 159 mg/dL (ref 100–199)
HDL: 49 mg/dL (ref >39)
LDL CALC: 96 mg/dL (ref 0–99)
TRIGLYCERIDES: 70 mg/dL (ref 0–149)
VLDL Cholesterol Cal: 14 mg/dL (ref 5–40)

## 2017-02-17 ENCOUNTER — Ambulatory Visit: Payer: Self-pay | Admitting: Pediatrics

## 2017-05-30 ENCOUNTER — Ambulatory Visit (INDEPENDENT_AMBULATORY_CARE_PROVIDER_SITE_OTHER): Payer: Self-pay | Admitting: Family

## 2017-05-30 ENCOUNTER — Encounter: Payer: Self-pay | Admitting: Family

## 2017-05-30 VITALS — BP 127/75 | HR 73 | Temp 96.9°F | Ht 78.0 in | Wt 219.6 lb

## 2017-05-30 DIAGNOSIS — J069 Acute upper respiratory infection, unspecified: Secondary | ICD-10-CM

## 2017-05-30 DIAGNOSIS — F17200 Nicotine dependence, unspecified, uncomplicated: Secondary | ICD-10-CM

## 2017-05-30 DIAGNOSIS — J449 Chronic obstructive pulmonary disease, unspecified: Secondary | ICD-10-CM

## 2017-05-30 MED ORDER — AZITHROMYCIN 250 MG PO TABS: ORAL_TABLET | ORAL | 0 refills | Status: DC

## 2017-05-30 MED ORDER — FLUTICASONE PROPIONATE 50 MCG/ACT NA SUSP: 2.0000 | Freq: Every day | NASAL | 6 refills | Status: DC

## 2017-05-30 NOTE — Progress Notes (Signed)
Subjective:    Patient ID: Timothy LauberJames Nielsen, male    DOB: 06-05-1971, [age] y.o.   MRN: [id number]  Sore Throat   This is a new problem. The current episode started in the past 7 days. The problem has been rapidly worsening. There has been no fever. The pain is at a severity of 8/10. The pain is moderate. Associated symptoms include congestion, coughing, a hoarse voice, shortness of breath (when i start coughing), swollen glands and trouble swallowing. Pertinent negatives include no ear pain or headaches. He has tried nothing for the symptoms. The treatment provided no relief.      Review of Systems  HENT: Positive for congestion, hoarse voice and trouble swallowing. Negative for ear pain.   Respiratory: Positive for cough and shortness of breath (when i start coughing).   Neurological: Negative for headaches.  All other systems reviewed and are negative.      Objective:   Physical Exam  Constitutional: He is oriented to person, place, and time. He appears well-developed and well-nourished. No distress.  HENT:  Head: Normocephalic.  Right Ear: External ear normal. A middle ear effusion is present.  Left Ear: External ear normal. A middle ear effusion is present.  Nose: Mucosal edema and rhinorrhea present.  Mouth/Throat: Posterior oropharyngeal erythema present.  Eyes: Pupils are equal, round, and reactive to light. Right eye exhibits no discharge. Left eye exhibits no discharge.  Neck: Normal range of motion. Neck supple. No thyromegaly present.  Cardiovascular: Normal rate, regular rhythm, normal heart sounds and intact distal pulses.  No murmur heard. Pulmonary/Chest: Effort normal and breath sounds normal. No respiratory distress. He has no wheezes.  Abdominal: Soft. Bowel sounds are normal. He exhibits no distension. There is no tenderness.  Musculoskeletal: Normal range of motion. He exhibits no edema or tenderness.  Neurological: He is alert and oriented to person, place, and time.   Skin: Skin is warm and dry. No rash noted. No erythema.  Psychiatric: He has a normal mood and affect. His behavior is normal. Judgment and thought content normal.  Vitals reviewed.     BP 127/75   Pulse 73   Temp (!) 96.9 F (36.1 C) (Oral)   Ht 6\' 6"  (1.981 m)   Wt 219 lb 9.6 oz (99.6 kg)   SpO2 97%   BMI 25.38 kg/m      Assessment & Plan:  1. Upper respiratory tract infection, unspecified type - Take meds as prescribed - Use a cool mist humidifier  -Use saline nose sprays frequently -Saline irrigations of the nose can be very helpful if done frequently. -Force fluids -For any cough or congestion  Use plain Mucinex- regular strength or max strength is fine -For fever or aces or pains- take tylenol or ibuprofen appropriate for age and weight. -Throat lozenges if help -New toothbrush in 3 days - fluticasone (FLONASE) 50 MCG/ACT nasal spray; Place 2 sprays into both nostrils daily.  Dispense: 16 g; Refill: 6  2. Current smoker Smoking cessation discussed - fluticasone (FLONASE) 50 MCG/ACT nasal spray; Place 2 sprays into both nostrils daily.  Dispense: 16 g; Refill: 6  3. Chronic obstructive pulmonary disease, unspecified COPD type (HCC) - fluticasone (FLONASE) 50 MCG/ACT nasal spray; Place 2 sprays into both nostrils daily.  Dispense: 16 g; Refill: 6  Pt told to call if symptoms worsen, pt is worried about the weather and states if it snows he will not be able to get out for several days. I told him I would  send in rx of a Zpak, but not to take unless symptoms worsen or do not improve  Jannifer Rodneyhristy Jaymian Bogart, FNP

## 2017-05-30 NOTE — Patient Instructions (Signed)
Upper Respiratory Infection, Adult Most upper respiratory infections (URIs) are caused by a virus. A URI affects the nose, throat, and upper air passages. The most common type of URI is often called "the common cold." Follow these instructions at home:  Take medicines only as told by your doctor.  Gargle warm saltwater or take cough drops to comfort your throat as told by your doctor.  Use a warm mist humidifier or inhale steam from a shower to increase air moisture. This may make it easier to breathe.  Drink enough fluid to keep your pee (urine) clear or pale yellow.  Eat soups and other clear broths.  Have a healthy diet.  Rest as needed.  Go back to work when your fever is gone or your doctor says it is okay. ? You may need to stay home longer to avoid giving your URI to others. ? You can also wear a face mask and wash your hands often to prevent spread of the virus.  Use your inhaler more if you have asthma.  Do not use any tobacco products, including cigarettes, chewing tobacco, or electronic cigarettes. If you need help quitting, ask your doctor. Contact a doctor if:  You are getting worse, not better.  Your symptoms are not helped by medicine.  You have chills.  You are getting more short of breath.  You have brown or red mucus.  You have yellow or brown discharge from your nose.  You have pain in your face, especially when you bend forward.  You have a fever.  You have puffy (swollen) neck glands.  You have pain while swallowing.  You have white areas in the back of your throat. Get help right away if:  You have very bad or constant: ? Headache. ? Ear pain. ? Pain in your forehead, behind your eyes, and over your cheekbones (sinus pain). ? Chest pain.  You have long-lasting (chronic) lung disease and any of the following: ? Wheezing. ? Long-lasting cough. ? Coughing up blood. ? A change in your usual mucus.  You have a stiff neck.  You have  changes in your: ? Vision. ? Hearing. ? Thinking. ? Mood. This information is not intended to replace advice given to you by your health care provider. Make sure you discuss any questions you have with your health care provider. Document Released: 11/27/2007 Document Revised: 02/11/2016 Document Reviewed: 09/15/2013 Elsevier Interactive Patient Education  2018 [employer] Inc[employer]  

## 2017-09-05 ENCOUNTER — Ambulatory Visit (INDEPENDENT_AMBULATORY_CARE_PROVIDER_SITE_OTHER): Payer: Self-pay | Admitting: Family Medicine

## 2017-09-05 ENCOUNTER — Encounter: Payer: Self-pay | Admitting: Family Medicine

## 2017-09-05 VITALS — BP 136/81 | HR 82 | Temp 97.8°F | Ht 78.0 in | Wt 220.0 lb

## 2017-09-05 DIAGNOSIS — J441 Chronic obstructive pulmonary disease with (acute) exacerbation: Secondary | ICD-10-CM

## 2017-09-05 MED ORDER — PREDNISONE 20 MG PO TABS
ORAL_TABLET | ORAL | 0 refills | Status: DC
Start: 2017-09-05 — End: 2018-03-09

## 2017-09-05 MED ORDER — AZITHROMYCIN 250 MG PO TABS: ORAL_TABLET | ORAL | 0 refills | Status: DC

## 2017-09-05 NOTE — Progress Notes (Signed)
BP 136/81   Pulse 82   Temp 97.8 F (36.6 C) (Oral)   Ht 6\' 6"  (1.981 m)   Wt 220 lb (99.8 kg)   BMI 25.42 kg/m    Subjective:    Patient ID: Timothy Nielsen, male    DOB: 1971-06-16, 47 y.o.   MRN: [id number]  HPI: Timothy Nielsen is a 47 y.o. male presenting on 09/05/2017 for Sinusitis (congestion and drainage, symptoms began 2 days ago; has taken Tylenol Cold & Flu and Alka-Seltzer Cold) and Cough   HPI Cough and congestion and sinus pressure Patient comes in complaining of cough and congestion and sinus pressure going on for the past 2-3 days.  He says is been taken Tylenol cold and flu and Alka-Seltzer cold and cough and he feels like they are not really helping.  He feels like is starting to get down in his chest and he has not had any wheezing But he feels like he is heading that direction.  He denies any fevers or chills or shortness of breath or wheezing.  His cough has been nonproductive mostly.  He has a lot of sinus pressure and postnasal drainage associated with this.  Relevant past medical, surgical, family and social history reviewed and updated as indicated. Interim medical history since our last visit reviewed. Allergies and medications reviewed and updated.  Review of Systems  Constitutional: Negative for chills and fever.  HENT: Positive for congestion, postnasal drip, rhinorrhea, sinus pressure, sneezing and sore throat. Negative for ear discharge, ear pain and voice change.   Eyes: Negative for pain, discharge, redness and visual disturbance.  Respiratory: Positive for cough. Negative for shortness of breath and wheezing.   Cardiovascular: Negative for chest pain and leg swelling.  Musculoskeletal: Negative for gait problem.  Skin: Negative for rash.  All other systems reviewed and are negative.   Per HPI unless specifically indicated above   Allergies as of 09/05/2017   No Known Allergies     Medication List        Accurate as of 09/05/17  9:29 AM. Always use  your most recent med list.          azithromycin 250 MG tablet Commonly known as:  ZITHROMAX Take 2 the first day and then one each day after.   fluticasone 50 MCG/ACT nasal spray Commonly known as:  FLONASE Place 2 sprays into both nostrils daily.   lisinopril-hydrochlorothiazide 20-12.5 MG tablet Commonly known as:  PRINZIDE,ZESTORETIC Take 1 tablet by mouth daily.   omeprazole 20 MG capsule Commonly known as:  PRILOSEC Take 1 capsule (20 mg total) by mouth daily.   predniSONE 20 MG tablet Commonly known as:  DELTASONE 2 po at same time daily for 5 days          Objective:    BP 136/81   Pulse 82   Temp 97.8 F (36.6 C) (Oral)   Ht 6\' 6"  (1.981 m)   Wt 220 lb (99.8 kg)   BMI 25.42 kg/m   Wt Readings from Last 3 Encounters:  09/05/17 220 lb (99.8 kg)  05/30/17 219 lb 9.6 oz (99.6 kg)  11/21/16 214 lb 12.8 oz (97.4 kg)    Physical Exam  Constitutional: He is oriented to person, place, and time. He appears well-developed and well-nourished. No distress.  HENT:  Right Ear: Tympanic membrane, external ear and ear canal normal.  Left Ear: Tympanic membrane, external ear and ear canal normal.  Nose: Mucosal edema and rhinorrhea present. No sinus  tenderness. No epistaxis. Right sinus exhibits maxillary sinus tenderness. Right sinus exhibits no frontal sinus tenderness. Left sinus exhibits maxillary sinus tenderness. Left sinus exhibits no frontal sinus tenderness.  Mouth/Throat: Uvula is midline and mucous membranes are normal. Posterior oropharyngeal edema and posterior oropharyngeal erythema present. No oropharyngeal exudate or tonsillar abscesses.  Eyes: Conjunctivae and EOM are normal. Pupils are equal, round, and reactive to light. No scleral icterus.  Neck: Neck supple. No thyromegaly present.  Cardiovascular: Normal rate, regular rhythm, normal heart sounds and intact distal pulses.  No murmur heard. Pulmonary/Chest: Effort normal. No respiratory distress. He  has wheezes (Mild end expiratory). He has no rales.  Musculoskeletal: Normal range of motion. He exhibits no edema.  Lymphadenopathy:    He has no cervical adenopathy.  Neurological: He is alert and oriented to person, place, and time. Coordination normal.  Skin: Skin is warm and dry. No rash noted. He is not diaphoretic.  Psychiatric: He has a normal mood and affect. His behavior is normal.  Nursing note and vitals reviewed.      Assessment & Plan:   Problem List Items Addressed This Visit    None    Visit Diagnoses    COPD exacerbation (HCC)    -  Primary   Relevant Medications   azithromycin (ZITHROMAX) 250 MG tablet   predniSONE (DELTASONE) 20 MG tablet       Follow up plan: Return if symptoms worsen or fail to improve.  Counseling provided for all of the vaccine components No orders of the defined types were placed in this encounter.   Arville CareJoshua Harden Bramer, MD [facility] 09/05/2017, 9:29 AM

## 2017-09-09 ENCOUNTER — Other Ambulatory Visit: Payer: Self-pay | Admitting: Family

## 2017-09-09 NOTE — Telephone Encounter (Signed)
Seen 09-05-17 for COPD. Please advise on refill.

## 2017-09-11 ENCOUNTER — Telehealth: Payer: Self-pay | Admitting: Family

## 2017-09-11 MED ORDER — ALBUTEROL SULFATE HFA 108 (90 BASE) MCG/ACT IN AERS: 2.0000 | INHALATION_SPRAY | Freq: Four times a day (QID) | RESPIRATORY_TRACT | 0 refills | Status: DC | PRN

## 2017-09-11 NOTE — Telephone Encounter (Signed)
I sent albuterol for the patient now.

## 2017-09-11 NOTE — Telephone Encounter (Signed)
I do not see Wellbutrin on med list or Med Hx. Who prescribed this for patient? I don't mind restarting this, but he will need follow up appt for labs in the near future.

## 2017-09-11 NOTE — Telephone Encounter (Signed)
Pt aware.

## 2017-09-17 NOTE — Telephone Encounter (Signed)
Attempts have been made to contact patient and no call back- this encounter will be closed.

## 2017-11-27 ENCOUNTER — Other Ambulatory Visit: Payer: Self-pay | Admitting: Family

## 2017-11-27 DIAGNOSIS — I10 Essential (primary) hypertension: Secondary | ICD-10-CM

## 2018-03-09 ENCOUNTER — Ambulatory Visit (INDEPENDENT_AMBULATORY_CARE_PROVIDER_SITE_OTHER): Payer: Self-pay | Admitting: Family

## 2018-03-09 ENCOUNTER — Encounter: Payer: Self-pay | Admitting: Family

## 2018-03-09 VITALS — BP 160/95 | HR 69 | Temp 97.4°F | Ht 78.0 in | Wt 224.0 lb

## 2018-03-09 DIAGNOSIS — K219 Gastro-esophageal reflux disease without esophagitis: Secondary | ICD-10-CM

## 2018-03-09 DIAGNOSIS — J449 Chronic obstructive pulmonary disease, unspecified: Secondary | ICD-10-CM

## 2018-03-09 DIAGNOSIS — I10 Essential (primary) hypertension: Secondary | ICD-10-CM

## 2018-03-09 DIAGNOSIS — F17200 Nicotine dependence, unspecified, uncomplicated: Secondary | ICD-10-CM

## 2018-03-09 DIAGNOSIS — I700 Atherosclerosis of aorta: Secondary | ICD-10-CM

## 2018-03-09 LAB — CMP14+EGFR
ALBUMIN: 4.6 g/dL (ref 3.5–5.5)
ALK PHOS: 97 IU/L (ref 39–117)
ALT: 27 IU/L (ref 0–44)
AST: 22 IU/L (ref 0–40)
Albumin/Globulin Ratio: 2.4 — ABNORMAL HIGH (ref 1.2–2.2)
BUN / CREAT RATIO: 9 (ref 9–20)
BUN: 9 mg/dL (ref 6–24)
Bilirubin Total: 0.2 mg/dL (ref 0.0–1.2)
CO2: 22 mmol/L (ref 20–29)
Calcium: 9.3 mg/dL (ref 8.7–10.2)
Chloride: 107 mmol/L — ABNORMAL HIGH (ref 96–106)
Creatinine, Ser: 0.99 mg/dL (ref 0.76–1.27)
GFR calc Af Amer: 103 mL/min/{1.73_m2} (ref >59)
GFR, EST NON AFRICAN AMERICAN: 89 mL/min/{1.73_m2} (ref >59)
GLOBULIN, TOTAL: 1.9 g/dL (ref 1.5–4.5)
GLUCOSE: 95 mg/dL (ref 65–99)
Potassium: 4.4 mmol/L (ref 3.5–5.2)
SODIUM: 142 mmol/L (ref 134–144)
Total Protein: 6.5 g/dL (ref 6.0–8.5)

## 2018-03-09 MED ORDER — LISINOPRIL-HYDROCHLOROTHIAZIDE 20-12.5 MG PO TABS: 1.0000 | ORAL_TABLET | Freq: Every day | ORAL | 3 refills | Status: DC

## 2018-03-09 MED ORDER — OMEPRAZOLE 20 MG PO CPDR: 20.0000 mg | DELAYED_RELEASE_CAPSULE | Freq: Every day | ORAL | 3 refills | Status: DC

## 2018-03-09 MED ORDER — SIMVASTATIN 20 MG PO TABS: 20.0000 mg | ORAL_TABLET | Freq: Every day | ORAL | 3 refills | Status: DC

## 2018-03-09 NOTE — Patient Instructions (Signed)

## 2018-03-09 NOTE — Progress Notes (Signed)
Subjective:    Patient ID: Timothy Nielsen, male    DOB: 08/03/1970, 47 y.o.   MRN: [id number]  Chief Complaint  Patient presents with  . Medical Management of Chronic Issues    medication refill   PT presents to the office today for chronic follow up. Pt has not been seen in over a year and is self pay. He states he has ran out of his medications for the last three weeks.  Gastroesophageal Reflux  He complains of heartburn. He reports no belching or no coughing. This is a chronic problem. The current episode started more than 1 year ago. The problem occurs occasionally. The problem has been waxing and waning. The heartburn is of moderate intensity. Risk factors include smoking/tobacco exposure. He has tried a PPI for the symptoms. The treatment provided moderate relief.  Hypertension  This is a chronic problem. The current episode started more than 1 year ago. The problem has been waxing and waning since onset. The problem is uncontrolled. Associated symptoms include shortness of breath. Pertinent negatives include no malaise/fatigue or peripheral edema. Risk factors for coronary artery disease include smoking/tobacco exposure. The current treatment provides moderate improvement. There is no history of CAD/MI, CVA or heart failure.  COPD Pt currently still smoking a pack a day. Not using any inhalers at this time. States his breathing is ok, but worse when it "gets really hot, I sometimes wheezing".    Review of Systems  Constitutional: Negative for malaise/fatigue.  Respiratory: Positive for shortness of breath. Negative for cough.   Gastrointestinal: Positive for heartburn.  All other systems reviewed and are negative.      Objective:   Physical Exam  Constitutional: He is oriented to person, place, and time. He appears well-developed and well-nourished. No distress.  HENT:  Head: Normocephalic.  Right Ear: External ear normal.  Left Ear: External ear normal.  Mouth/Throat:  Oropharynx is clear and moist.  Eyes: Pupils are equal, round, and reactive to light. Right eye exhibits no discharge. Left eye exhibits no discharge.  Neck: Normal range of motion. Neck supple. No thyromegaly present.  Cardiovascular: Normal rate, regular rhythm, normal heart sounds and intact distal pulses.  No murmur heard. Pulmonary/Chest: Effort normal. No respiratory distress. He has decreased breath sounds. He has no wheezes.  Abdominal: Soft. Bowel sounds are normal. He exhibits no distension. There is no tenderness.  Musculoskeletal: Normal range of motion. He exhibits no edema or tenderness.  Neurological: He is alert and oriented to person, place, and time. He has normal reflexes. No cranial nerve deficit.  Skin: Skin is warm and dry. No rash noted. No erythema.  Psychiatric: He has a normal mood and affect. His behavior is normal. Judgment and thought content normal.  Vitals reviewed.     BP (!) 160/95   Pulse 69   Temp (!) 97.4 F (36.3 C) (Oral)   Ht '6\' 6"'  (1.981 m)   Wt 224 lb (101.6 kg)   BMI 25.89 kg/m      Assessment & Plan:  Timothy Nielsen comes in today with chief complaint of Medical Management of Chronic Issues (medication refill)   Diagnosis and orders addressed:  1. Essential hypertension, benign - lisinopril-hydrochlorothiazide (PRINZIDE,ZESTORETIC) 20-12.5 MG tablet; Take 1 tablet by mouth daily.  Dispense: 90 tablet; Refill: 3 - CMP14+EGFR  2. Gastroesophageal reflux disease, esophagitis presence not specified - omeprazole (PRILOSEC) 20 MG capsule; Take 1 capsule (20 mg total) by mouth daily.  Dispense: 90 capsule; Refill: 3 -  CMP14+EGFR  3. Current smoker Smoking cessation  - simvastatin (ZOCOR) 20 MG tablet; Take 1 tablet (20 mg total) by mouth at bedtime.  Dispense: 90 tablet; Refill: 3 - CMP14+EGFR  4. Essential hypertension - CMP14+EGFR  5. Chronic obstructive pulmonary disease, unspecified COPD type ([place]) - CMP14+EGFR  6. Aortic  atherosclerosis (HCC) Zocor started today Low fat diet and exercise - simvastatin (ZOCOR) 20 MG tablet; Take 1 tablet (20 mg total) by mouth at bedtime.  Dispense: 90 tablet; Refill: 3 - CMP14+EGFR   Labs pending Health Maintenance reviewed Diet and exercise encouraged  Follow up plan: 2 weeks with Nurse to recheck B/P, 6 months for follow up  Evelina Dun, FNP

## 2018-11-17 ENCOUNTER — Encounter (INDEPENDENT_AMBULATORY_CARE_PROVIDER_SITE_OTHER): Payer: Self-pay

## 2018-12-22 ENCOUNTER — Other Ambulatory Visit: Payer: Self-pay

## 2018-12-23 ENCOUNTER — Ambulatory Visit (INDEPENDENT_AMBULATORY_CARE_PROVIDER_SITE_OTHER): Payer: Self-pay | Admitting: Family Medicine

## 2018-12-23 ENCOUNTER — Encounter: Payer: Self-pay | Admitting: Family Medicine

## 2018-12-23 VITALS — BP 142/86 | HR 77 | Temp 97.2°F | Ht 78.0 in | Wt 209.0 lb

## 2018-12-23 DIAGNOSIS — M25561 Pain in right knee: Secondary | ICD-10-CM

## 2018-12-23 MED ORDER — PREDNISONE 10 MG PO TABS: ORAL_TABLET | ORAL | 0 refills | Status: DC

## 2018-12-23 NOTE — Progress Notes (Signed)
Chief Complaint  Patient presents with  . Knee Pain    right    HPI  Patient presents today for 2 weeks of increasing pain in the right knee.  This is interfering with his ability to walk and climb.  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  He describes the pain as a sharp sensation with a grinding.  It is improved with resting.  He can rest for an extended period of time and then get up and walk around some before it recurs.  Pain can be easily 10/10 when he walks for an extended period of time.  It is 5/10 after walking just a few moments.  It can drop to 0 out of 10 when resting.  PMH: Smoking status noted ROS: Per HPI  Objective: BP (!) 142/86   Pulse 77   Temp (!) 97.2 F (36.2 C) (Oral)   Ht 6\' 6"  (1.981 m)   Wt 209 lb (94.8 kg)   BMI 24.15 kg/m  Gen: NAD, alert, cooperative with exam HEENT: NCAT, EOMI, PERRL CV: RRR, good S1/S2, no murmur Resp: CTABL, no wheezes, non-labored Ext: Theright knee has full range of motion there is discomfort for movement passively. Gait demonstrates a limp. The joint lines are moderately tender. The patella is palpable and tender.  There is no edema..  Lachman / anterior drawer signs are negative for signs of instability and pain free. McMurray testing reveals no pop or excessive discomfort. Varus and valgus stree maneuvers do not cause ligamentous stretch or instability. Neuro: Alert and oriented, No gross deficits  Assessment and plan:  No diagnosis found.  Meds ordered this encounter  Medications  . predniSONE (DELTASONE) 10 MG tablet    Sig: Take 5 daily for 3 days followed by 4,3,2 and 1 for 3 days each.    Dispense:  45 tablet    Refill:  0      Follow up as needed.  Claretta Fraise, MD

## 2019-03-24 ENCOUNTER — Telehealth: Payer: Self-pay | Admitting: Family

## 2019-03-24 DIAGNOSIS — I10 Essential (primary) hypertension: Secondary | ICD-10-CM

## 2019-03-24 MED ORDER — LISINOPRIL-HYDROCHLOROTHIAZIDE 20-12.5 MG PO TABS: 1.0000 | ORAL_TABLET | Freq: Every day | ORAL | 0 refills | Status: DC

## 2019-03-24 NOTE — Telephone Encounter (Signed)
Refill sent to pharmacy.  Needs to be seen for any further refills.  

## 2019-03-24 NOTE — Telephone Encounter (Signed)
What is the name of the medication? lisinopril-hydrochlorothiazide (PRINZIDE,ZESTORETIC) 20-12.5 MG tablet  Have you contacted your pharmacy to request a refill? no  Which pharmacy would you like this sent to? [facility]. Pt took his last pill today scheduled apt for 03/29/2019   Patient notified that their request is being sent to the clinical staff for review and that they should receive a call once it is complete. If they do not receive a call within 24 hours they can check with their pharmacy or our office.

## 2019-03-29 ENCOUNTER — Encounter: Payer: Self-pay | Admitting: Family

## 2019-03-29 ENCOUNTER — Ambulatory Visit (INDEPENDENT_AMBULATORY_CARE_PROVIDER_SITE_OTHER): Payer: Self-pay | Admitting: Family

## 2019-03-29 DIAGNOSIS — I10 Essential (primary) hypertension: Secondary | ICD-10-CM

## 2019-03-29 DIAGNOSIS — J449 Chronic obstructive pulmonary disease, unspecified: Secondary | ICD-10-CM

## 2019-03-29 DIAGNOSIS — E785 Hyperlipidemia, unspecified: Secondary | ICD-10-CM

## 2019-03-29 DIAGNOSIS — I700 Atherosclerosis of aorta: Secondary | ICD-10-CM

## 2019-03-29 DIAGNOSIS — F17200 Nicotine dependence, unspecified, uncomplicated: Secondary | ICD-10-CM

## 2019-03-29 MED ORDER — SIMVASTATIN 20 MG PO TABS: 20.0000 mg | ORAL_TABLET | Freq: Every day | ORAL | 3 refills | Status: DC

## 2019-03-29 MED ORDER — LISINOPRIL-HYDROCHLOROTHIAZIDE 20-12.5 MG PO TABS: 1.0000 | ORAL_TABLET | Freq: Every day | ORAL | 0 refills | Status: DC

## 2019-03-29 NOTE — Progress Notes (Signed)
Virtual Visit via telephone Note Due to COVID-19 pandemic this visit was conducted virtually. This visit type was conducted due to national recommendations for restrictions regarding the COVID-19 Pandemic (e.g. social distancing, sheltering in place) in an effort to limit this patient's exposure and mitigate transmission in our community. All issues noted in this document were discussed and addressed.  A physical exam was not performed with this format.  I connected with Timothy Nielsen on 03/29/19 at 9:10 AM by telephone and verified that I am speaking with the correct person using two identifiers. Timothy Nielsen is currently located at work and no one is currently with him during visit. The provider, Evelina Dun, FNP is located in their office at time of visit.  I discussed the limitations, risks, security and privacy concerns of performing an evaluation and management service by telephone and the availability of in person appointments. I also discussed with the patient that there may be a patient responsible charge related to this service. The patient expressed understanding and agreed to proceed.   History and Present Illness:  Hypertension This is a chronic problem. The current episode started more than 1 year ago. The problem has been resolved since onset. The problem is controlled. Pertinent negatives include no headaches, malaise/fatigue, peripheral edema or shortness of breath. Risk factors for coronary artery disease include dyslipidemia, male gender and sedentary lifestyle. The current treatment provides moderate improvement. There is no history of kidney disease, CAD/MI or heart failure.  Hyperlipidemia This is a chronic problem. The current episode started more than 1 year ago. The problem is controlled. Recent lipid tests were reviewed and are normal. Pertinent negatives include no shortness of breath. Current antihyperlipidemic treatment includes statins. The current treatment provides  moderate improvement of lipids. Risk factors for coronary artery disease include dyslipidemia, hypertension and a sedentary lifestyle.  Nicotine Dependence Presents for follow-up visit. His urge triggers include company of smokers. The symptoms have been stable. He smokes 1 pack of cigarettes per day.  COPD PT continues to smoke a pack a day. Reports his breathing is stable.     Review of Systems  Constitutional: Negative for malaise/fatigue.  Respiratory: Negative for shortness of breath.   Neurological: Negative for headaches.  All other systems reviewed and are negative.    Observations/Objective: No SOB or distress noted   Assessment and Plan: 1. Current smoker - simvastatin (ZOCOR) 20 MG tablet; Take 1 tablet (20 mg total) by mouth at bedtime.  Dispense: 90 tablet; Refill: 3 - CMP14+EGFR; Future - CBC with Differential/Platelet; Future  2. Aortic atherosclerosis (HCC) - simvastatin (ZOCOR) 20 MG tablet; Take 1 tablet (20 mg total) by mouth at bedtime.  Dispense: 90 tablet; Refill: 3 - CMP14+EGFR; Future - CBC with Differential/Platelet; Future - Lipid panel; Future  3. Essential hypertension, benign - lisinopril-hydrochlorothiazide (ZESTORETIC) 20-12.5 MG tablet; Take 1 tablet by mouth daily. NEEDS TO BE SEEN FOR FURTHER REFILLS  Dispense: 30 tablet; Refill: 0 - CMP14+EGFR; Future - CBC with Differential/Platelet; Future  4. Essential hypertension - CMP14+EGFR; Future - CBC with Differential/Platelet; Future  5. Chronic obstructive pulmonary disease, unspecified COPD type ([place]) - CMP14+EGFR; Future - CBC with Differential/Platelet; Future  6. Hyperlipidemia, unspecified hyperlipidemia type - CMP14+EGFR; Future - CBC with Differential/Platelet; Future - Lipid panel; Future  Pt will come in the next few days and have lab work completed Encouraged healthy diet and exercise    I discussed the assessment and treatment plan with the patient. The patient was provided  an  opportunity to ask questions and all were answered. The patient agreed with the plan and demonstrated an understanding of the instructions.   The patient was advised to call back or seek an in-person evaluation if the symptoms worsen or if the condition fails to improve as anticipated.  The above assessment and management plan was discussed with the patient. The patient verbalized understanding of and has agreed to the management plan. Patient is aware to call the clinic if symptoms persist or worsen. Patient is aware when to return to the clinic for a follow-up visit. Patient educated on when it is appropriate to go to the emergency department.   Time call ended: 9:25 AM   I provided 15 minutes of non-face-to-face time during this encounter.    Evelina Dun, FNP

## 2019-03-30 ENCOUNTER — Other Ambulatory Visit: Payer: Self-pay

## 2019-03-30 DIAGNOSIS — I10 Essential (primary) hypertension: Secondary | ICD-10-CM

## 2019-03-30 DIAGNOSIS — J449 Chronic obstructive pulmonary disease, unspecified: Secondary | ICD-10-CM

## 2019-03-30 DIAGNOSIS — F17200 Nicotine dependence, unspecified, uncomplicated: Secondary | ICD-10-CM

## 2019-03-30 DIAGNOSIS — E785 Hyperlipidemia, unspecified: Secondary | ICD-10-CM

## 2019-03-30 DIAGNOSIS — I700 Atherosclerosis of aorta: Secondary | ICD-10-CM

## 2019-03-31 LAB — CMP14+EGFR
ALT: 28 IU/L (ref 0–44)
AST: 19 IU/L (ref 0–40)
Albumin/Globulin Ratio: 2 (ref 1.2–2.2)
Albumin: 4.6 g/dL (ref 4.0–5.0)
Alkaline Phosphatase: 94 IU/L (ref 39–117)
BUN/Creatinine Ratio: 10 (ref 9–20)
BUN: 10 mg/dL (ref 6–24)
Bilirubin Total: 0.4 mg/dL (ref 0.0–1.2)
CO2: 22 mmol/L (ref 20–29)
Calcium: 10 mg/dL (ref 8.7–10.2)
Chloride: 101 mmol/L (ref 96–106)
Creatinine, Ser: 1.05 mg/dL (ref 0.76–1.27)
GFR calc Af Amer: 95 mL/min/{1.73_m2} (ref >59)
GFR calc non Af Amer: 82 mL/min/{1.73_m2} (ref >59)
Globulin, Total: 2.3 g/dL (ref 1.5–4.5)
Glucose: 199 mg/dL — ABNORMAL HIGH (ref 65–99)
Potassium: 4.2 mmol/L (ref 3.5–5.2)
Sodium: 136 mmol/L (ref 134–144)
Total Protein: 6.9 g/dL (ref 6.0–8.5)

## 2019-03-31 LAB — CBC WITH DIFFERENTIAL/PLATELET
Basophils Absolute: 0.1 10*3/uL (ref 0.0–0.2)
Basos: 1 %
EOS (ABSOLUTE): 0.2 10*3/uL (ref 0.0–0.4)
Eos: 3 %
Hematocrit: 43.3 % (ref 37.5–51.0)
Hemoglobin: 15.3 g/dL (ref 13.0–17.7)
Immature Grans (Abs): 0 10*3/uL (ref 0.0–0.1)
Immature Granulocytes: 0 %
Lymphocytes Absolute: 2.1 10*3/uL (ref 0.7–3.1)
Lymphs: 29 %
MCH: 32.6 pg (ref 26.6–33.0)
MCHC: 35.3 g/dL (ref 31.5–35.7)
MCV: 92 fL (ref 79–97)
Monocytes Absolute: 0.8 10*3/uL (ref 0.1–0.9)
Monocytes: 11 %
Neutrophils Absolute: 4.1 10*3/uL (ref 1.4–7.0)
Neutrophils: 56 %
Platelets: 229 10*3/uL (ref 150–450)
RBC: 4.7 x10E6/uL (ref 4.14–5.80)
RDW: 12.2 % (ref 11.6–15.4)
WBC: 7.2 10*3/uL (ref 3.4–10.8)

## 2019-03-31 LAB — LIPID PANEL
Chol/HDL Ratio: 2.6 ratio (ref 0.0–5.0)
Cholesterol, Total: 142 mg/dL (ref 100–199)
HDL: 55 mg/dL (ref >39)
LDL Chol Calc (NIH): 72 mg/dL (ref 0–99)
Triglycerides: 77 mg/dL (ref 0–149)
VLDL Cholesterol Cal: 15 mg/dL (ref 5–40)

## 2019-04-03 LAB — SPECIMEN STATUS REPORT

## 2019-04-03 LAB — HGB A1C W/O EAG: Hgb A1c MFr Bld: 5.4 % (ref 4.8–5.6)

## 2019-05-24 ENCOUNTER — Telehealth: Payer: Self-pay | Admitting: Family

## 2019-05-24 DIAGNOSIS — I10 Essential (primary) hypertension: Secondary | ICD-10-CM

## 2019-05-24 MED ORDER — LISINOPRIL-HYDROCHLOROTHIAZIDE 20-12.5 MG PO TABS: 1.0000 | ORAL_TABLET | Freq: Every day | ORAL | 0 refills | Status: DC

## 2019-05-24 MED ORDER — LISINOPRIL-HYDROCHLOROTHIAZIDE 20-12.5 MG PO TABS: 1.0000 | ORAL_TABLET | Freq: Every day | ORAL | 2 refills | Status: DC

## 2019-05-24 NOTE — Telephone Encounter (Signed)
Prescription sent to pharmacy.

## 2019-05-24 NOTE — Telephone Encounter (Signed)
What is the name of the medication? lisinopril-hydrochlorothiazide (ZESTORETIC) 20-12.5 MG tablet  Have you contacted your pharmacy to request a refill? no  Which pharmacy would you like this sent to? [facility]. Pt ntbs but states he cannot go without his bp med. Please call back if its ok to be without till apt   Patient notified that their request is being sent to the clinical staff for review and that they should receive a call once it is complete. If they do not receive a call within 24 hours they can check with their pharmacy or our office.

## 2019-05-24 NOTE — Telephone Encounter (Signed)
Aware. 

## 2019-06-02 ENCOUNTER — Encounter: Payer: Self-pay | Admitting: Family Medicine

## 2019-06-02 ENCOUNTER — Other Ambulatory Visit: Payer: Self-pay

## 2019-06-02 ENCOUNTER — Ambulatory Visit (INDEPENDENT_AMBULATORY_CARE_PROVIDER_SITE_OTHER): Payer: Self-pay | Admitting: Family Medicine

## 2019-06-02 VITALS — BP 119/76 | HR 98 | Temp 99.3°F | Ht 78.0 in | Wt 221.0 lb

## 2019-06-02 DIAGNOSIS — M545 Low back pain, unspecified: Secondary | ICD-10-CM

## 2019-06-02 MED ORDER — TIZANIDINE HCL 4 MG PO TABS: 2.0000 mg | ORAL_TABLET | Freq: Three times a day (TID) | ORAL | 0 refills | Status: DC | PRN

## 2019-06-02 MED ORDER — PREDNISONE 10 MG PO TABS: ORAL_TABLET | ORAL | 0 refills | Status: DC

## 2019-06-02 NOTE — Progress Notes (Signed)
Subjective: CC: back pain PCP: Sharion Balloon, FNP HPI: Patient is a 48 y.o. male presenting to clinic today for back pain. Concerns today include:  1. Back Pain Patient reports that pain began 1 week ago.  Reports intermittent h/o back pain.  Pain is a moderate/severe at times.  It does not radiate.  Certain positions and lifting worsens pain.  Biofreeze has slightly improved pain.  Patient saw his chiropractor last Thursday for several sessions but thus far this has not helped.  Patient reports that pain started after he lifted a large tree trunk at work.  He kind has just been dealing with the pain.  Denies dysuria, hematuria, fevers, chills, nausea, vomiting, abdominal pain, renal stones.   Denies saddle anesthesia, urinary retention/incontinence, bowel incontinence, weakness, falls, sensation changes or pain anywhere else.  No h/o back surgeries.   Current Outpatient Medications:  .  lisinopril-hydrochlorothiazide (ZESTORETIC) 20-12.5 MG tablet, Take 1 tablet by mouth daily., Disp: 90 tablet, Rfl: 2 .  simvastatin (ZOCOR) 20 MG tablet, Take 1 tablet (20 mg total) by mouth at bedtime., Disp: 90 tablet, Rfl: 3 No Known Allergies  Past Medical History:  Diagnosis Date  . Asthma   . Hypertension    Social History   Socioeconomic History  . Marital status: Single    Spouse name: Not on file  . Number of children: Not on file  . Years of education: Not on file  . Highest education level: Not on file  Occupational History  . Not on file  Social Needs  . Financial resource strain: Not on file  . Food insecurity    Worry: Not on file    Inability: Not on file  . Transportation needs    Medical: Not on file    Non-medical: Not on file  Tobacco Use  . Smoking status: Current Every Day Smoker    Packs/day: 1.00    Years: 15.00    Pack years: 15.00    Types: Cigarettes    Start date: 11/14/1986  . Smokeless tobacco: Never Used  Substance and Sexual Activity  . Alcohol  use: Yes    Alcohol/week: 18.0 standard drinks    Types: 18 Cans of beer per week  . Drug use: No  . Sexual activity: Not on file  Lifestyle  . Physical activity    Days per week: Not on file    Minutes per session: Not on file  . Stress: Not on file  Relationships  . Social Herbalist on phone: Not on file    Gets together: Not on file    Attends religious service: Not on file    Active member of club or organization: Not on file    Attends meetings of clubs or organizations: Not on file    Relationship status: Not on file  . Intimate partner violence    Fear of current or ex partner: Not on file    Emotionally abused: Not on file    Physically abused: Not on file    Forced sexual activity: Not on file  Other Topics Concern  . Not on file  Social History Narrative  . Not on file   Past Surgical History:  Procedure Laterality Date  . APPENDECTOMY    . FRACTURE SURGERY      ROS: per HPI  Objective: Office vital signs reviewed. BP 119/76   Pulse 98   Temp 99.3 F (37.4 C) (Temporal)   Ht 6\' 6"  (  1.981 m)   Wt 221 lb (100.2 kg)   SpO2 97%   BMI 25.54 kg/m   Physical Examination:  General: Awake, alert, well nourished, NAD Extremities: Warm, well-perfused. No edema, cyanosis or clubbing; +2 pulses bilaterally MSK: antalgic gait and hunched station  lumbar Spine: limited AROM in extension due to pain, nomidline tenderness to palpation, no paraspinal tenderness to palpation.  No palpable bony deformities,  Negative straight leg test Neuro: 5/5 lower extremity strength; lower extremity light touch sensation grossly intact  Assessment/ Plan: Senai Kingsley is a 48 y.o. male here with  1. Acute bilateral low back pain without sciatica Likely deep paraspinal muscle spasm based on today's exam.  I placed him on prednisone taper and Zanaflex up to 3 times daily as needed.  We discussed use of heat and home physical therapy which was provided today.  Will defer  x-rays for now as I do not have a high suspicion for bony abnormality or disc pathology at this point.  Okay to continue Biofreeze.  I have given him a work note excusing him from the remainder of the week.  We discussed red flag signs and symptoms warranting further evaluation.  He voiced good understanding of follow-up as needed - predniSONE (DELTASONE) 10 MG tablet; Take 60mg  by mouth day 1, 50mg  day 2, 40mg  day 3, 30mg  day 4, 20mg  day 5, 10mg  day 6.  Then stop.  Dispense: 21 tablet; Refill: 0 - tiZANidine (ZANAFLEX) 4 MG tablet; Take 0.5-1 tablets (2-4 mg total) by mouth every 8 (eight) hours as needed for muscle spasms.  Dispense: 30 tablet; Refill: 0    Ashly , DO [facility]

## 2019-11-09 ENCOUNTER — Ambulatory Visit (INDEPENDENT_AMBULATORY_CARE_PROVIDER_SITE_OTHER): Payer: Self-pay | Admitting: Nurse Practitioner

## 2019-11-09 ENCOUNTER — Other Ambulatory Visit: Payer: Self-pay

## 2019-11-09 ENCOUNTER — Encounter: Payer: Self-pay | Admitting: Nurse Practitioner

## 2019-11-09 VITALS — BP 122/74 | HR 81 | Temp 97.3°F | Resp 20 | Ht 78.0 in | Wt 234.0 lb

## 2019-11-09 DIAGNOSIS — J449 Chronic obstructive pulmonary disease, unspecified: Secondary | ICD-10-CM

## 2019-11-09 DIAGNOSIS — I10 Essential (primary) hypertension: Secondary | ICD-10-CM

## 2019-11-09 MED ORDER — DULERA 100-5 MCG/ACT IN AERO: 2.0000 | INHALATION_SPRAY | Freq: Two times a day (BID) | RESPIRATORY_TRACT | 0 refills | Status: DC

## 2019-11-09 MED ORDER — ACETAMINOPHEN 500 MG PO TABS: 500.0000 mg | ORAL_TABLET | Freq: Four times a day (QID) | ORAL | 0 refills | Status: DC | PRN

## 2019-11-09 NOTE — Progress Notes (Signed)
Established Patient Office Visit  Subjective:  Patient ID: Timothy Nielsen, male    DOB: [birth year]  Age: 49 y.o. MRN: [id number]  CC:  Chief Complaint  Patient presents with  . Hypertension    HPI Timothy Nielsen presents for follow up of hypertension. Patient was diagnosed in 2020. Currently medication is Zestoretic 20-12.5 mg daily. The patient is tolerating the medication well without side effects. Compliance with treatment has been good; including taking medication as directed , maintains a healthy diet and regular exercise regimen , and following up as directed.  Patient is reporting headaches that started about a week ago, this is new for patient as patient is experiencing increase stress taking care of his mother. Patient reports intermittent tension like headaches that comes and goes but occurs about once in the last two weeks, patient is not having headaches today.  Concerning patients COPD, he reports worsening breathing, SOB and has not been able to use medication as prescribed, he has no insurance and can not afford breathing treatment. In the past samples of breo have been provided and patient reports having therapeutic effects from medication.  Past Medical History:  Diagnosis Date  . Asthma   . Hypertension     Past Surgical History:  Procedure Laterality Date  . APPENDECTOMY    . FRACTURE SURGERY      Family History  Problem Relation Age of Onset  . Diabetes Mother   . Hypertension Mother   . Diabetes Father     Social History   Socioeconomic History  . Marital status: Single    Spouse name: Not on file  . Number of children: Not on file  . Years of education: Not on file  . Highest education level: Not on file  Occupational History  . Not on file  Tobacco Use  . Smoking status: Current Every Day Smoker    Packs/day: 1.00    Years: 15.00    Pack years: 15.00    Types: Cigarettes    Start date: 11/14/1986  . Smokeless tobacco: Never Used  Substance and  Sexual Activity  . Alcohol use: Yes    Alcohol/week: 18.0 standard drinks    Types: 18 Cans of beer per week  . Drug use: No  . Sexual activity: Not on file  Other Topics Concern  . Not on file  Social History Narrative  . Not on file   Social Determinants of Health   Financial Resource Strain:   . Difficulty of Paying Living Expenses:   Food Insecurity:   . Worried About Charity fundraiser in the Last Year:   . Arboriculturist in the Last Year:   Transportation Needs:   . Film/video editor (Medical):   Marland Kitchen Lack of Transportation (Non-Medical):   Physical Activity:   . Days of Exercise per Week:   . Minutes of Exercise per Session:   Stress:   . Feeling of Stress :   Social Connections:   . Frequency of Communication with Friends and Family:   . Frequency of Social Gatherings with Friends and Family:   . Attends Religious Services:   . Active Member of Clubs or Organizations:   . Attends Archivist Meetings:   Marland Kitchen Marital Status:   Intimate Partner Violence:   . Fear of Current or Ex-Partner:   . Emotionally Abused:   Marland Kitchen Physically Abused:   . Sexually Abused:     Outpatient Medications Prior to Visit  Medication Sig  Dispense Refill  . lisinopril-hydrochlorothiazide (ZESTORETIC) 20-12.5 MG tablet Take 1 tablet by mouth daily. 90 tablet 2  . omeprazole (PRILOSEC) 20 MG capsule Take 20 mg by mouth daily.    . simvastatin (ZOCOR) 20 MG tablet Take 1 tablet (20 mg total) by mouth at bedtime. 90 tablet 3  . tiZANidine (ZANAFLEX) 4 MG tablet Take 0.5-1 tablets (2-4 mg total) by mouth every 8 (eight) hours as needed for muscle spasms. (Patient not taking: Reported on 11/09/2019) 30 tablet 0  . predniSONE (DELTASONE) 10 MG tablet Take 60mg  by mouth day 1, 50mg  day 2, 40mg  day 3, 30mg  day 4, 20mg  day 5, 10mg  day 6.  Then stop. 21 tablet 0   No facility-administered medications prior to visit.     ROS Review of Systems  Constitutional: Negative for activity  change, appetite change and fatigue.  Eyes: Positive for redness.  Respiratory: Positive for chest tightness, shortness of breath and wheezing.   Cardiovascular: Negative for chest pain and palpitations.  Gastrointestinal: Negative.   Endocrine: Negative.   Genitourinary: Negative.   Musculoskeletal: Negative.   Skin: Negative for color change and rash.  Neurological: Positive for headaches.  Psychiatric/Behavioral: The patient is not nervous/anxious.       Objective:    Physical Exam  Constitutional: He is oriented to person, place, and time. He appears well-developed and well-nourished.  HENT:  Head: Normocephalic.  Mouth/Throat: Oropharynx is clear and moist.  Eyes: Conjunctivae are normal.  Cardiovascular: Normal rate and regular rhythm.  Pulmonary/Chest: He has wheezes.  Abdominal: Bowel sounds are normal.  Musculoskeletal:        General: No tenderness.     Cervical back: Neck supple.  Neurological: He is alert and oriented to person, place, and time.  Skin: Skin is warm. No rash noted. No erythema.  Psychiatric: He has a normal mood and affect. His behavior is normal.    BP 122/74   Pulse 81   Temp (!) 97.3 F (36.3 C)   Resp 20   Ht 6\' 6"  (1.981 m)   Wt 234 lb (106.1 kg)   SpO2 100%   BMI 27.04 kg/m  Wt Readings from Last 3 Encounters:  11/09/19 234 lb (106.1 kg)  06/02/19 221 lb (100.2 kg)  12/23/18 209 lb (94.8 kg)     Health Maintenance Due  Topic Date Due  . HIV Screening  Never done  . COVID-19 Vaccine (1) Never done  . COLONOSCOPY  Never done    There are no preventive care reminders to display for this patient.  No results found for: TSH Lab Results  Component Value Date   WBC 7.2 03/30/2019   HGB 15.3 03/30/2019   HCT 43.3 03/30/2019   MCV 92 03/30/2019   PLT 229 03/30/2019   Lab Results  Component Value Date   NA 136 03/30/2019   K 4.2 03/30/2019   CO2 22 03/30/2019   GLUCOSE 199 (H) 03/30/2019   BUN 10 03/30/2019    CREATININE 1.05 03/30/2019   BILITOT 0.4 03/30/2019   ALKPHOS 94 03/30/2019   AST 19 03/30/2019   ALT 28 03/30/2019   PROT 6.9 03/30/2019   ALBUMIN 4.6 03/30/2019   CALCIUM 10.0 03/30/2019   Lab Results  Component Value Date   CHOL 142 03/30/2019   Lab Results  Component Value Date   HDL 55 03/30/2019   Lab Results  Component Value Date   LDLCALC 72 03/30/2019   Lab Results  Component Value Date  TRIG 77 03/30/2019   Lab Results  Component Value Date   CHOLHDL 2.6 03/30/2019   Lab Results  Component Value Date   HGBA1C 5.4 03/30/2019      Assessment & Plan:   Problem List Items Addressed This Visit      Cardiovascular and Mediastinum   Essential hypertension - Primary    Blood pressure is well controlled no changes to current medication. Continue eating a healthy low sodium diet and exercise .        Respiratory   COPD (chronic obstructive pulmonary disease) (HCC)    Not well controlled, discussed smoking cessation with patient, provided education. Sample dulera (breathing treatment) provided to patient. Follow up as needed with worsening symptoms        Relevant Medications   mometasone-formoterol (DULERA) 100-5 MCG/ACT AERO      Meds ordered this encounter  Medications  . acetaminophen (TYLENOL) 500 MG tablet    Sig: Take 1 tablet (500 mg total) by mouth every 6 (six) hours as needed.    Dispense:  30 tablet    Refill:  0    Order Specific Question:   Supervising Provider    Answer:   Arville Care A [id number]  . mometasone-formoterol (DULERA) 100-5 MCG/ACT AERO    Sig: Inhale 2 puffs into the lungs 2 (two) times daily at 10 AM and 5 PM.    Dispense:  8.8 g    Refill:  0    Lot# [id number] Exp - 01/21/2020    Follow-up: Return if symptoms worsen or fail to improve.    Daryll Drown, NP

## 2019-11-09 NOTE — Patient Instructions (Addendum)
Not well controlled, discussed smoking cessation with patient, provided education. Sample Dulera (breathing treatment) provided to patient. Follow up as needed with worsening symptoms    Blood pressure is well controlled no changes to current medication. Continue eating a healthy low sodium diet and exercise . Stress, Adult Stress is a normal reaction to life events. Stress is what you feel when life demands more than you are used to, or more than you think you can handle. Some stress can be useful, such as studying for a test or meeting a deadline at work. Stress that occurs too often or for too long can cause problems. It can affect your emotional health and interfere with relationships and normal daily activities. Too much stress can weaken your body's defense system (immune system) and increase your risk for physical illness. If you already have a medical problem, stress can make it worse. What are the causes? All sorts of life events can cause stress. An event that causes stress for one person may not be stressful for another person. Major life events, whether positive or negative, commonly cause stress. Examples include:  Losing a job or starting a new job.  Losing a loved one.  Moving to a new town or home.  Getting married or divorced.  Having a baby.  Getting injured or sick. Less obvious life events can also cause stress, especially if they occur day after day or in combination with each other. Examples include:  Working long hours.  Driving in traffic.  Caring for children.  Being in debt.  Being in a difficult relationship. What are the signs or symptoms? Stress can cause emotional symptoms, including:  Anxiety. This is feeling worried, afraid, on edge, overwhelmed, or out of control.  Anger, including irritation or impatience.  Depression. This is feeling sad, down, helpless, or guilty.  Trouble focusing, remembering, or making decisions. Stress can cause  physical symptoms, including:  Aches and pains. These may affect your head, neck, back, stomach, or other areas of your body.  Tight muscles or a clenched jaw.  Low energy.  Trouble sleeping. Stress can cause unhealthy behaviors, including:  Eating to feel better (overeating) or skipping meals.  Working too much or putting off tasks.  Smoking, drinking alcohol, or using drugs to feel better. How is this diagnosed? Stress is diagnosed through an assessment by your health care provider. He or she may diagnose this condition based on:  Your symptoms and any stressful life events.  Your medical history.  Tests to rule out other causes of your symptoms. Depending on your condition, your health care provider may refer you to a specialist for further evaluation. How is this treated?  Stress management techniques are the recommended treatment for stress. Medicine is not typically recommended for the treatment of stress. Techniques to reduce your reaction to stressful life events include:  Stress identification. Monitor yourself for symptoms of stress and identify what causes stress for you. These skills may help you to avoid or prepare for stressful events.  Time management. Set your priorities, keep a calendar of events, and learn to say no. Taking these actions can help you avoid making too many commitments. Techniques for coping with stress include:  Rethinking the problem. Try to think realistically about stressful events rather than ignoring them or overreacting. Try to find the positives in a stressful situation rather than focusing on the negatives.  Exercise. Physical exercise can release both physical and emotional tension. The key is to find a form  of exercise that you enjoy and do it regularly.  Relaxation techniques. These relax the body and mind. The key is to find one or more that you enjoy and use the techniques regularly. Examples include: ? Meditation, deep breathing,  or progressive relaxation techniques. ? Yoga or tai chi. ? Biofeedback, mindfulness techniques, or journaling. ? Listening to music, being out in nature, or participating in other hobbies.  Practicing a healthy lifestyle. Eat a balanced diet, drink plenty of water, limit or avoid caffeine, and get plenty of sleep.  Having a strong support network. Spend time with family, friends, or other people you enjoy being around. Express your feelings and talk things over with someone you trust. Counseling or talk therapy with a mental health professional may be helpful if you are having trouble managing stress on your own. Follow these instructions at home: Lifestyle   Avoid drugs.  Do not use any products that contain nicotine or tobacco, such as cigarettes, e-cigarettes, and chewing tobacco. If you need help quitting, ask your health care provider.  Limit alcohol intake to no more than 1 drink a day for nonpregnant women and 2 drinks a day for men. One drink equals 12 oz of beer, 5 oz of wine, or 1 oz of hard liquor  Do not use alcohol or drugs to relax.  Eat a balanced diet that includes fresh fruits and vegetables, whole grains, lean meats, fish, eggs, and beans, and low-fat dairy. Avoid processed foods and foods high in added fat, sugar, and salt.  Exercise at least 30 minutes on 5 or more days each week.  Get 7-8 hours of sleep each night. General instructions   Practice stress management techniques as discussed with your health care provider.  Drink enough fluid to keep your urine clear or pale yellow.  Take over-the-counter and prescription medicines only as told by your health care provider.  Keep all follow-up visits as told by your health care provider. This is important. Contact a health care provider if:  Your symptoms get worse.  You have new symptoms.  You feel overwhelmed by your problems and can no longer manage them on your own. Get help right away if:  You have  thoughts of hurting yourself or others. If you ever feel like you may hurt yourself or others, or have thoughts about taking your own life, get help right away. You can go to your nearest emergency department or call:  Your local emergency services (911 in the [place]  A suicide crisis helpline, such as the [facility] at 918-014-0045. This is open 24 hours a day. Summary  Stress is a normal reaction to life events. It can cause problems if it happens too often or for too long.  Practicing stress management techniques is the best way to treat stress.  Counseling or talk therapy with a mental health professional may be helpful if you are having trouble managing stress on your own. This information is not intended to replace advice given to you by your health care provider. Make sure you discuss any questions you have with your health care provider. Document Revised: 01/08/2019 Document Reviewed: 07/31/2016 Elsevier Patient Education  [place] Headache Without Cause A headache is pain or discomfort that is felt around the head or neck area. There are many causes and types of headaches. In some cases, the cause may not be found. Follow these instructions at home: Watch your condition for any changes. Let your doctor  know about them. Take these steps to help with your condition: Managing pain      Take over-the-counter and prescription medicines only as told by your doctor.  Lie down in a dark, quiet room when you have a headache.  If told, put ice on your head and neck area: ? Put ice in a plastic bag. ? Place a towel between your skin and the bag. ? Leave the ice on for 20 minutes, 2-3 times per day.  If told, put heat on the affected area. Use the heat source that your doctor recommends, such as a moist heat pack or a heating pad. ? Place a towel between your skin and the heat source. ? Leave the heat on for 20-30 minutes. ? Remove  the heat if your skin turns bright red. This is very important if you are unable to feel pain, heat, or cold. You may have a greater risk of getting burned.  Keep lights dim if bright lights bother you or make your headaches worse. Eating and drinking  Eat meals on a regular schedule.  If you drink alcohol: ? Limit how much you use to:  0-1 drink a day for women.  0-2 drinks a day for men. ? Be aware of how much alcohol is in your drink. In the [place] one drink equals one 12 oz bottle of beer (355 mL), one 5 oz glass of wine (148 mL), or one 1 oz glass of hard liquor (44 mL).  Stop drinking caffeine, or reduce how much caffeine you drink. General instructions   Keep a journal to find out if certain things bring on headaches. For example, write down: ? What you eat and drink. ? How much sleep you get. ? Any change to your diet or medicines.  Get a massage or try other ways to relax.  Limit stress.  Sit up straight. Do not tighten (tense) your muscles.  Do not use any products that contain nicotine or tobacco. This includes cigarettes, e-cigarettes, and chewing tobacco. If you need help quitting, ask your doctor.  Exercise regularly as told by your doctor.  Get enough sleep. This often means 7-9 hours of sleep each night.  Keep all follow-up visits as told by your doctor. This is important. Contact a doctor if:  Your symptoms are not helped by medicine.  You have a headache that feels different than the other headaches.  You feel sick to your stomach (nauseous) or you throw up (vomit).  You have a fever. Get help right away if:  Your headache gets very bad quickly.  Your headache gets worse after a lot of physical activity.  You keep throwing up.  You have a stiff neck.  You have trouble seeing.  You have trouble speaking.  You have pain in the eye or ear.  Your muscles are weak or you lose muscle control.  You lose your balance or have trouble  walking.  You feel like you will pass out (faint) or you pass out.  You are mixed up (confused).  You have a seizure. Summary  A headache is pain or discomfort that is felt around the head or neck area.  There are many causes and types of headaches. In some cases, the cause may not be found.  Keep a journal to help find out what causes your headaches. Watch your condition for any changes. Let your doctor know about them.  Contact a doctor if you have a headache that is different from  usual, or if your headache is not helped by medicine.  Get help right away if your headache gets very bad, you throw up, you have trouble seeing, you lose your balance, or you have a seizure. This information is not intended to replace advice given to you by your health care provider. Make sure you discuss any questions you have with your health care provider. Document Revised: 12/29/2017 Document Reviewed: 12/29/2017 Elsevier Patient Education  [place].  Hypertension, Adult Hypertension is another name for high blood pressure. High blood pressure forces your heart to work harder to pump blood. This can cause problems over time. There are two numbers in a blood pressure reading. There is a top number (systolic) over a bottom number (diastolic). It is best to have a blood pressure that is below 120/80. Healthy choices can help lower your blood pressure, or you may need medicine to help lower it. What are the causes? The cause of this condition is not known. Some conditions may be related to high blood pressure. What increases the risk?  Smoking.  Having type 2 diabetes mellitus, high cholesterol, or both.  Not getting enough exercise or physical activity.  Being overweight.  Having too much fat, sugar, calories, or salt (sodium) in your diet.  Drinking too much alcohol.  Having long-term (chronic) kidney disease.  Having a family history of high blood pressure.  Age. Risk increases  with age.  Race. You may be at higher risk if you are African American.  Gender. Men are at higher risk than women before age 63. After age 96, women are at higher risk than men.  Having obstructive sleep apnea.  Stress. What are the signs or symptoms?  High blood pressure may not cause symptoms. Very high blood pressure (hypertensive crisis) may cause: ? Headache. ? Feelings of worry or nervousness (anxiety). ? Shortness of breath. ? Nosebleed. ? A feeling of being sick to your stomach (nausea). ? Throwing up (vomiting). ? Changes in how you see. ? Very bad chest pain. ? Seizures. How is this treated?  This condition is treated by making healthy lifestyle changes, such as: ? Eating healthy foods. ? Exercising more. ? Drinking less alcohol.  Your health care provider may prescribe medicine if lifestyle changes are not enough to get your blood pressure under control, and if: ? Your top number is above 130. ? Your bottom number is above 80.  Your personal target blood pressure may vary. Follow these instructions at home: Eating and drinking   If told, follow the DASH eating plan. To follow this plan: ? Fill one half of your plate at each meal with fruits and vegetables. ? Fill one fourth of your plate at each meal with whole grains. Whole grains include whole-wheat pasta, brown rice, and whole-grain bread. ? Eat or drink low-fat dairy products, such as skim milk or low-fat yogurt. ? Fill one fourth of your plate at each meal with low-fat (lean) proteins. Low-fat proteins include fish, chicken without skin, eggs, beans, and tofu. ? Avoid fatty meat, cured and processed meat, or chicken with skin. ? Avoid pre-made or processed food.  Eat less than 1,500 mg of salt each day.  Do not drink alcohol if: ? Your doctor tells you not to drink. ? You are pregnant, may be pregnant, or are planning to become pregnant.  If you drink alcohol: ? Limit how much you use to:  0-1  drink a day for women.  0-2 drinks a day  for men. ? Be aware of how much alcohol is in your drink. In the [place] one drink equals one 12 oz bottle of beer (355 mL), one 5 oz glass of wine (148 mL), or one 1 oz glass of hard liquor (44 mL). Lifestyle   Work with your doctor to stay at a healthy weight or to lose weight. Ask your doctor what the best weight is for you.  Get at least 30 minutes of exercise most days of the week. This may include walking, swimming, or biking.  Get at least 30 minutes of exercise that strengthens your muscles (resistance exercise) at least 3 days a week. This may include lifting weights or doing Pilates.  Do not use any products that contain nicotine or tobacco, such as cigarettes, e-cigarettes, and chewing tobacco. If you need help quitting, ask your doctor.  Check your blood pressure at home as told by your doctor.  Keep all follow-up visits as told by your doctor. This is important. Medicines  Take over-the-counter and prescription medicines only as told by your doctor. Follow directions carefully.  Do not skip doses of blood pressure medicine. The medicine does not work as well if you skip doses. Skipping doses also puts you at risk for problems.  Ask your doctor about side effects or reactions to medicines that you should watch for. Contact a doctor if you:  Think you are having a reaction to the medicine you are taking.  Have headaches that keep coming back (recurring).  Feel dizzy.  Have swelling in your ankles.  Have trouble with your vision. Get help right away if you:  Get a very bad headache.  Start to feel mixed up (confused).  Feel weak or numb.  Feel faint.  Have very bad pain in your: ? Chest. ? Belly (abdomen).  Throw up more than once.  Have trouble breathing. Summary  Hypertension is another name for high blood pressure.  High blood pressure forces your heart to work harder to pump blood.  For most people, a  normal blood pressure is less than 120/80.  Making healthy choices can help lower blood pressure. If your blood pressure does not get lower with healthy choices, you may need to take medicine. This information is not intended to replace advice given to you by your health care provider. Make sure you discuss any questions you have with your health care provider. Document Revised: 02/18/2018 Document Reviewed: 02/18/2018 Elsevier Patient Education  [place].  Smoking Tobacco Information, Adult Smoking tobacco can be harmful to your health. Tobacco contains a poisonous (toxic), colorless chemical called nicotine. Nicotine is addictive. It changes the brain and can make it hard to stop smoking. Tobacco also has other toxic chemicals that can hurt your body and raise your risk of many cancers. How can smoking tobacco affect me? Smoking tobacco puts you at risk for:  Cancer. Smoking is most commonly associated with lung cancer, but can also lead to cancer in other parts of the body.  Chronic obstructive pulmonary disease (COPD). This is a long-term lung condition that makes it hard to breathe. It also gets worse over time.  High blood pressure (hypertension), heart disease, stroke, or heart attack.  Lung infections, such as pneumonia.  Cataracts. This is when the lenses in the eyes become clouded.  Digestive problems. This may include peptic ulcers, heartburn, and gastroesophageal reflux disease (GERD).  Oral health problems, such as gum disease and tooth loss.  Loss of taste and smell.  Smoking can affect your appearance by causing:  Wrinkles.  Yellow or stained teeth, fingers, and fingernails. Smoking tobacco can also affect your social life, because:  It may be challenging to find places to smoke when away from home. Many workplaces, [employer], hotels, and public places are tobacco-free.  Smoking is expensive. This is due to the cost of tobacco and the long-term costs of  treating health problems from smoking.  Secondhand smoke may affect those around you. Secondhand smoke can cause lung cancer, breathing problems, and heart disease. Children of smokers have a higher risk for: ? Sudden infant death syndrome (SIDS). ? Ear infections. ? Lung infections. If you currently smoke tobacco, quitting now can help you:  Lead a longer and healthier life.  Look, smell, breathe, and feel better over time.  Save money.  Protect others from the harms of secondhand smoke. What actions can I take to prevent health problems? Quit smoking   Do not start smoking. Quit if you already do.  Make a plan to quit smoking and commit to it. Look for programs to help you and ask your health care provider for recommendations and ideas.  Set a date and write down all the reasons you want to quit.  Let your friends and family know you are quitting so they can help and support you. Consider finding friends who also want to quit. It can be easier to quit with someone else, so that you can support each other.  Talk with your health care provider about using nicotine replacement medicines to help you quit, such as gum, lozenges, patches, sprays, or pills.  Do not replace cigarette smoking with electronic cigarettes, which are commonly called e-cigarettes. The safety of e-cigarettes is not known, and some may contain harmful chemicals.  If you try to quit but return to smoking, stay positive. It is common to slip up when you first quit, so take it one day at a time.  Be prepared for cravings. When you feel the urge to smoke, chew gum or suck on hard candy. Lifestyle  Stay busy and take care of your body.  Drink enough fluid to keep your urine pale yellow.  Get plenty of exercise and eat a healthy diet. This can help prevent weight gain after quitting.  Monitor your eating habits. Quitting smoking can cause you to have a larger appetite than when you smoke.  Find ways to relax.  Go out with friends or family to a movie or a restaurant where people do not smoke.  Ask your health care provider about having regular tests (screenings) to check for cancer. This may include blood tests, imaging tests, and other tests.  Find ways to manage your stress, such as meditation, yoga, or exercise. Where to find support To get support to quit smoking, consider:  Asking your health care provider for more information and resources.  Taking classes to learn more about quitting smoking.  Looking for local organizations that offer resources about quitting smoking.  Joining a support group for people who want to quit smoking in your local community.  Calling the smokefree.gov counselor helpline: 1-800-Quit-Now 408-252-5072) Where to find more information You may find more information about quitting smoking from:  HelpGuide.org: www.helpguide.org  https://hall.com/: smokefree.gov  American Lung Association: www.lung.org Contact a health care provider if you:  Have problems breathing.  Notice that your lips, nose, or fingers turn blue.  Have chest pain.  Are coughing up blood.  Feel faint or you pass out.  Have other health changes that cause you to worry. Summary  Smoking tobacco can negatively affect your health, the health of those around you, your finances, and your social life.  Do not start smoking. Quit if you already do. If you need help quitting, ask your health care provider.  Think about joining a support group for people who want to quit smoking in your local community. There are many effective programs that will help you to quit this behavior. This information is not intended to replace advice given to you by your health care provider. Make sure you discuss any questions you have with your health care provider. Document Revised: 03/05/2019 Document Reviewed: 06/25/2016 Elsevier Patient Education  2020 [employer].

## 2019-11-09 NOTE — Assessment & Plan Note (Signed)
Blood pressure is well controlled no changes to current medication. Continue eating a healthy low sodium diet and exercise .

## 2019-11-09 NOTE — Assessment & Plan Note (Addendum)
Not well controlled, discussed smoking cessation with patient, provided education. Sample dulera (breathing treatment) provided to patient. Follow up as needed with worsening symptoms

## 2019-11-19 ENCOUNTER — Telehealth: Payer: Self-pay | Admitting: Family

## 2019-11-19 NOTE — Telephone Encounter (Signed)
  Prescription Request  11/19/2019  What is the name of the medication or equipment? dulera inhaler sample  Have you contacted your pharmacy to request a refill? (if applicable) no pt says that [facility] to him to call back in a month to see if there would be samples available. Pt will be out tomorrow morning. Pt really needs the sample because rx is $400.  Which pharmacy would you like this sent to? NONE  Please call back today.  Patient notified that their request is being sent to the clinical staff for review and that they should receive a response within 2 business days.

## 2019-11-19 NOTE — Telephone Encounter (Signed)
Left message to please come by office for dulera sample.

## 2019-12-21 ENCOUNTER — Telehealth: Payer: Self-pay | Admitting: Family

## 2019-12-21 NOTE — Telephone Encounter (Signed)
If patient is uninsured, he can make appt with me (it's usually around $30 cash to see me) and I can try to get him free inhalers.  He needs to bring his proof income if he works (pay stubs, etc) in order to qualify.  We do not have albuterol samples (or his combination inhaler).

## 2019-12-21 NOTE — Telephone Encounter (Signed)
Spoke with patient and appointment scheduled with Raynelle Fanning on 12/23/2019 at 1:30 pm.  Patient will bring last four paystubs.

## 2019-12-21 NOTE — Telephone Encounter (Signed)
His aware we do not have Dulera sample.  He would like to have help with his medications because he does not have insurance.  Any suggestions for patient?

## 2019-12-21 NOTE — Telephone Encounter (Signed)
Pt checking to see if the office has samples of the albuterol inhaler. Also is wanting to know how many puffs he should be taking at one time. Please advise.

## 2019-12-23 ENCOUNTER — Ambulatory Visit: Payer: Self-pay | Admitting: Pharmacist

## 2019-12-24 ENCOUNTER — Other Ambulatory Visit: Payer: Self-pay

## 2019-12-24 ENCOUNTER — Ambulatory Visit: Payer: Self-pay | Admitting: Pharmacist

## 2019-12-24 DIAGNOSIS — J449 Chronic obstructive pulmonary disease, unspecified: Secondary | ICD-10-CM

## 2019-12-24 MED ORDER — BREO ELLIPTA 100-25 MCG/INH IN AEPB: 1.0000 | INHALATION_SPRAY | Freq: Every day | RESPIRATORY_TRACT | 4 refills | Status: DC

## 2019-12-24 MED ORDER — ALBUTEROL SULFATE HFA 108 (90 BASE) MCG/ACT IN AERS: 2.0000 | INHALATION_SPRAY | Freq: Four times a day (QID) | RESPIRATORY_TRACT | 12 refills | Status: DC | PRN

## 2019-12-24 NOTE — Progress Notes (Signed)
HPI 73 yoM presents today  to see pharmacy team for COPD.  Past medical history includes HTN, GERD.  He is out of his inhalers and having difficulty breathing throughout the day.  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  His symptoms of SOB/wheezing are exacerbated by the heat.  Number of hospitalizations in past year: 0 Number of COPD exacerbations in past year: 0  Respiratory Medications Current: dulera (out due to cost) Tried in past: breo, symbicort Patient reports adherence challenges  OBJECTIVE No Known Allergies  Outpatient Encounter Medications as of 12/24/2019  Medication Sig  . acetaminophen (TYLENOL) 500 MG tablet Take 1 tablet (500 mg total) by mouth every 6 (six) hours as needed.  Marland Kitchen albuterol (VENTOLIN HFA) 108 (90 Base) MCG/ACT inhaler Inhale 2 puffs into the lungs every 6 (six) hours as needed for wheezing or shortness of breath.  . fluticasone furoate-vilanterol (BREO ELLIPTA) 100-25 MCG/INH AEPB Inhale 1 puff into the lungs daily.  Marland Kitchen lisinopril-hydrochlorothiazide (ZESTORETIC) 20-12.5 MG tablet Take 1 tablet by mouth daily.  . mometasone-formoterol (DULERA) 100-5 MCG/ACT AERO Inhale 2 puffs into the lungs 2 (two) times daily at 10 AM and 5 PM.  . omeprazole (PRILOSEC) 20 MG capsule Take 20 mg by mouth daily.  . simvastatin (ZOCOR) 20 MG tablet Take 1 tablet (20 mg total) by mouth at bedtime.  Marland Kitchen tiZANidine (ZANAFLEX) 4 MG tablet Take 0.5-1 tablets (2-4 mg total) by mouth every 8 (eight) hours as needed for muscle spasms. (Patient not taking: Reported on 11/09/2019)   No facility-administered encounter medications on file as of 12/24/2019.     PFTs No flowsheet data found.   Eosinophils Most recent blood eosinophil count was 3 cells/microL taken on 03/30/19.   IgE   Assessment   1. Inhaler Optimization  Optimal inhaler for patient would be Breo Ellipta considering compliance/patient assistance program.  Patient was counseled on the purpose, proper use, and adverse  effects of Breo ellipta inhaler.  Instructed patient to rinse mouth with water after using in order to prevent fungal infection.  Patient verbalized understanding.  Reviewed appropriate use of maintenance vs rescue inhalers.  Stressed importance of using maintenance inhaler daily and rescue inhaler only as needed.  Patient verbalized understanding.  Demonstrated proper inhaler technique using Breo Ellipta demo inhaler.  Patient able to demonstrate proper inhaler technique using teach back method. Patient was given sample in office today.  His inhaler was primed and able to administer first dose in office without issue.  2. Medication Reconciliation  A drug regimen assessment was performed, including review of allergies, interactions, disease-state management, dosing and immunization history. Medications were reviewed with the patient, including name, instructions, indication, goals of therapy, potential side effects, importance of adherence, and safe use.  Drug interaction(s): n/a  3. Immunizations  Patient is indicated for the influenzae, pneumonia, and shingles vaccinations.  PLAN -Discontinue Dulera -Start Breo Ellipta -Start Ventolin (albuterol) rescue inhaler -Apply for [employer] patient assistance, patient uninsured -Patient will have Breztri samples until [employer] PAP medications arrive . TIW#[id number], EXP 9/22.  Instructed to take 2 puffs twice daily.  All questions encouraged and answered.  Instructed patient to reach out with any further questions or concerns.  Thank you for allowing pharmacy to participate in this patient's care.  This appointment required 30 minutes of patient care (this includes precharting, chart review, review of results, face-to-face care, etc.). Marland Kitchen  Kieth Brightly, PharmD, BCPS Clinical Pharmacist, [facility]  II Phone [phone]

## 2019-12-29 ENCOUNTER — Telehealth: Payer: Self-pay | Admitting: Pharmacist

## 2019-12-29 NOTE — Telephone Encounter (Signed)
Paperwork faxed for [employer] PAP for Breo Ellipta and Ventolin HFA inhalers.  Patient is uninsured  Will f/u to determine if patient is approved for PAP

## 2020-01-12 ENCOUNTER — Telehealth: Payer: Self-pay | Admitting: Pharmacist

## 2020-01-12 NOTE — Telephone Encounter (Signed)
Patient approved for Breo Ellipta and Ventolin inhalers free through [employer] until 06/23/20 Left VM with patient explaining this information Encouraged patient to call back if questions arise

## 2020-04-05 ENCOUNTER — Other Ambulatory Visit: Payer: Self-pay

## 2020-04-05 DIAGNOSIS — I10 Essential (primary) hypertension: Secondary | ICD-10-CM

## 2020-04-05 MED ORDER — LISINOPRIL-HYDROCHLOROTHIAZIDE 20-12.5 MG PO TABS: 1.0000 | ORAL_TABLET | Freq: Every day | ORAL | 0 refills | Status: DC

## 2020-04-05 NOTE — Telephone Encounter (Signed)
  Prescription Request  04/05/2020  What is the name of the medication or equipment? Blood pressure meds  Have you contacted your pharmacy to request a refill? (if applicable) no  Which pharmacy would you like this sent to? [facility]   Patient notified that their request is being sent to the clinical staff for review and that they should receive a response within 2 business days.

## 2020-04-05 NOTE — Telephone Encounter (Signed)
Pt aware refill sent to pharmacy & 6 mos appt made for 05/01/20

## 2020-05-01 ENCOUNTER — Encounter: Payer: Self-pay | Admitting: Family

## 2020-05-01 ENCOUNTER — Ambulatory Visit (INDEPENDENT_AMBULATORY_CARE_PROVIDER_SITE_OTHER): Payer: Self-pay | Admitting: Family

## 2020-05-01 ENCOUNTER — Other Ambulatory Visit: Payer: Self-pay

## 2020-05-01 VITALS — BP 135/79 | HR 81 | Temp 97.8°F | Ht 78.0 in | Wt 246.8 lb

## 2020-05-01 DIAGNOSIS — J449 Chronic obstructive pulmonary disease, unspecified: Secondary | ICD-10-CM

## 2020-05-01 DIAGNOSIS — M722 Plantar fascial fibromatosis: Secondary | ICD-10-CM

## 2020-05-01 DIAGNOSIS — I10 Essential (primary) hypertension: Secondary | ICD-10-CM

## 2020-05-01 DIAGNOSIS — K219 Gastro-esophageal reflux disease without esophagitis: Secondary | ICD-10-CM

## 2020-05-01 DIAGNOSIS — H65191 Other acute nonsuppurative otitis media, right ear: Secondary | ICD-10-CM

## 2020-05-01 DIAGNOSIS — I700 Atherosclerosis of aorta: Secondary | ICD-10-CM

## 2020-05-01 DIAGNOSIS — F17200 Nicotine dependence, unspecified, uncomplicated: Secondary | ICD-10-CM

## 2020-05-01 MED ORDER — DICLOFENAC SODIUM 75 MG PO TBEC: 75.0000 mg | DELAYED_RELEASE_TABLET | Freq: Two times a day (BID) | ORAL | 0 refills | Status: DC

## 2020-05-01 MED ORDER — PREDNISONE 10 MG (21) PO TBPK: ORAL_TABLET | ORAL | 0 refills | Status: DC

## 2020-05-01 MED ORDER — SIMVASTATIN 20 MG PO TABS: 20.0000 mg | ORAL_TABLET | Freq: Every day | ORAL | 3 refills | Status: DC

## 2020-05-01 MED ORDER — LISINOPRIL-HYDROCHLOROTHIAZIDE 20-12.5 MG PO TABS: 1.0000 | ORAL_TABLET | Freq: Every day | ORAL | 3 refills | Status: DC

## 2020-05-01 MED ORDER — ASPIRIN EC 81 MG PO TBEC: 81.0000 mg | DELAYED_RELEASE_TABLET | Freq: Every day | ORAL | 11 refills | Status: DC

## 2020-05-01 MED ORDER — AMOXICILLIN 875 MG PO TABS: 875.0000 mg | ORAL_TABLET | Freq: Two times a day (BID) | ORAL | 0 refills | Status: DC

## 2020-05-01 NOTE — Addendum Note (Signed)
Addended by: Jannifer Rodney A on: 05/01/2020 02:48 PM   Modules accepted: Orders

## 2020-05-01 NOTE — Progress Notes (Addendum)
Subjective:    Patient ID: Timothy Nielsen, male    DOB: [birth year], 49 y.o.   MRN: [id number]  Chief Complaint  Patient presents with  . Hypertension  . Foot Pain    right foot for a month   . Otalgia    left ear when cold air hits started last week    PT presents to the office today for chronic follow up.  Hypertension This is a chronic problem. The current episode started more than 1 year ago. The problem has been resolved since onset. The problem is controlled. Pertinent negatives include no malaise/fatigue, peripheral edema or shortness of breath. Risk factors for coronary artery disease include dyslipidemia, obesity and smoking/tobacco exposure. The current treatment provides moderate improvement. There is no history of kidney disease, CAD/MI, CVA or heart failure.  Foot Pain This is a new problem. The current episode started more than 1 month ago. The problem occurs intermittently. The problem has been gradually worsening. Pertinent negatives include no sore throat. Exacerbated by: standing  He has tried rest and acetaminophen (got new shoes) for the symptoms. The treatment provided no relief.  Otalgia  There is pain in the left ear. This is a new problem. The current episode started in the past 7 days. The problem occurs constantly. The problem has been gradually worsening. There has been no fever. The pain is at a severity of 5/10. The pain is moderate. Pertinent negatives include no ear discharge, hearing loss, rhinorrhea or sore throat. He has tried acetaminophen for the symptoms. The treatment provided no relief.  Gastroesophageal Reflux He complains of belching and heartburn. He reports no sore throat. This is a chronic problem. The current episode started more than 1 year ago. The problem occurs occasionally. The problem has been waxing and waning. He has tried a PPI for the symptoms. The treatment provided moderate relief.  Hyperlipidemia This is a chronic problem. The current  episode started more than 1 year ago. Recent lipid tests were reviewed and are normal. Exacerbating diseases include obesity. Pertinent negatives include no shortness of breath. Current antihyperlipidemic treatment includes statins. The current treatment provides moderate improvement of lipids. Risk factors for coronary artery disease include dyslipidemia, male sex, hypertension and a sedentary lifestyle.  Nicotine Dependence Presents for follow-up visit. Symptoms are negative for sore throat. His urge triggers include company of smokers. The symptoms have been stable. He smokes 1 pack of cigarettes per day. Compliance with prior treatments has been good.  COPD Pt continues to smoke 1 pack a day. Using [place] daily. States his breathing is stable.     Review of Systems  Constitutional: Negative for malaise/fatigue.  HENT: Positive for ear pain. Negative for ear discharge, hearing loss, rhinorrhea and sore throat.   Respiratory: Negative for shortness of breath.   Gastrointestinal: Positive for heartburn.  All other systems reviewed and are negative.      Objective:   Physical Exam Vitals reviewed.  Constitutional:      General: He is not in acute distress.    Appearance: He is well-developed.  HENT:     Head: Normocephalic.     Right Ear: Tympanic membrane normal.     Left Ear: Tympanic membrane normal.  Eyes:     General:        Right eye: No discharge.        Left eye: No discharge.     Pupils: Pupils are equal, round, and reactive to light.  Neck:  Thyroid: No thyromegaly.  Cardiovascular:     Rate and Rhythm: Normal rate and regular rhythm.     Heart sounds: Normal heart sounds. No murmur heard.   Pulmonary:     Effort: Pulmonary effort is normal. No respiratory distress.     Breath sounds: Decreased breath sounds and wheezing present.  Abdominal:     General: Bowel sounds are normal. There is no distension.     Palpations: Abdomen is soft.     Tenderness: There is no  abdominal tenderness.  Musculoskeletal:        General: No tenderness. Normal range of motion.     Cervical back: Normal range of motion and neck supple.       Feet:  Skin:    General: Skin is warm and dry.     Findings: No erythema or rash.  Neurological:     Mental Status: He is alert and oriented to person, place, and time.     Cranial Nerves: No cranial nerve deficit.     Deep Tendon Reflexes: Reflexes are normal and symmetric.  Psychiatric:        Behavior: Behavior normal.        Thought Content: Thought content normal.        Judgment: Judgment normal.       BP 135/79   Pulse 81   Temp 97.8 F (36.6 C) (Temporal)   Ht _0  (1.981 m)   Wt 246 lb 12.8 oz (111.9 kg)   SpO2 97%   BMI 28.52 kg/m      Assessment & Plan:  Timothy Nielsen comes in today with chief complaint of Hypertension, Foot Pain (right foot for a month ), and Otalgia (left ear when cold air hits started last week )   Diagnosis and orders addressed:  1. Current smoker - simvastatin (ZOCOR) 20 MG tablet; Take 1 tablet (20 mg total) by mouth at bedtime.  Dispense: 90 tablet; Refill: 3 - aspirin EC 81 MG tablet; Take 1 tablet (81 mg total) by mouth daily. Swallow whole.  Dispense: 30 tablet; Refill: 11 - CMP14+EGFR  2. Aortic atherosclerosis (HCC) - simvastatin (ZOCOR) 20 MG tablet; Take 1 tablet (20 mg total) by mouth at bedtime.  Dispense: 90 tablet; Refill: 3 - aspirin EC 81 MG tablet; Take 1 tablet (81 mg total) by mouth daily. Swallow whole.  Dispense: 30 tablet; Refill: 11 - CMP14+EGFR  3. Essential hypertension, benign - lisinopril-hydrochlorothiazide (ZESTORETIC) 20-12.5 MG tablet; Take 1 tablet by mouth daily.  Dispense: 90 tablet; Refill: 3 - CMP14+EGFR  4. Essential hypertension - CMP14+EGFR  5. Chronic obstructive pulmonary disease, unspecified COPD type ([place]) - CMP14+EGFR  6. Gastroesophageal reflux disease, unspecified whether esophagitis present - CMP14+EGFR  7. Plantar  fasciitis Rest ROM exercises discussed- Handout given No other NSAID's diclofenac  - predniSONE (STERAPRED UNI-PAK 21 TAB) 10 MG (21) TBPK tablet; Use as directed  Dispense: 21 tablet; Refill: 0 - diclofenac (VOLTAREN) 75 MG EC tablet; Take 1 tablet (75 mg total) by mouth 2 (two) times daily.  Dispense: 30 tablet; Refill: 0  8. Other non-recurrent acute nonsuppurative otitis media of right ear - Take meds as prescribed - Use a cool mist humidifier  -Use saline nose sprays frequently -Force fluids -For any cough or congestion  Use plain Mucinex- regular strength or max strength is fine -For fever or aces or pains- take tylenol or ibuprofen. -Throat lozenges if help - amoxicillin (AMOXIL) 875 MG tablet; Take 1 tablet (875 mg total)  by mouth 2 (two) times daily.  Dispense: 14 tablet; Refill: 0   Labs pending Health Maintenance reviewed Diet and exercise encouraged  Follow up plan: 6 months    Evelina Dun, FNP

## 2020-05-01 NOTE — Patient Instructions (Signed)
Plantar Fasciitis Rehab Ask your health care provider which exercises are safe for you. Do exercises exactly as told by your health care provider and adjust them as directed. It is normal to feel mild stretching, pulling, tightness, or discomfort as you do these exercises. Stop right away if you feel sudden pain or your pain gets worse. Do not begin these exercises until told by your health care provider. Stretching and range-of-motion exercises These exercises warm up your muscles and joints and improve the movement and flexibility of your foot. These exercises also help to relieve pain. Plantar fascia stretch  1. Sit with your left / right leg crossed over your opposite knee. 2. Hold your heel with one hand with that thumb near your arch. With your other hand, hold your toes and gently pull them back toward the top of your foot. You should feel a stretch on the bottom of your toes or your foot (plantar fascia) or both. 3. Hold this stretch for__________ seconds. 4. Slowly release your toes and return to the starting position. Repeat __________ times. Complete this exercise __________ times a day. Gastrocnemius stretch, standing This exercise is also called a calf (gastroc) stretch. It stretches the muscles in the back of the upper calf. 1. Stand with your hands against a wall. 2. Extend your left / right leg behind you, and bend your front knee slightly. 3. Keeping your heels on the floor and your back knee straight, shift your weight toward the wall. Do not arch your back. You should feel a gentle stretch in your upper left / right calf. 4. Hold this position for __________ seconds. Repeat __________ times. Complete this exercise __________ times a day. Soleus stretch, standing This exercise is also called a calf (soleus) stretch. It stretches the muscles in the back of the lower calf. 1. Stand with your hands against a wall. 2. Extend your left / right leg behind you, and bend your front  knee slightly. 3. Keeping your heels on the floor, bend your back knee and shift your weight slightly over your back leg. You should feel a gentle stretch deep in your lower calf. 4. Hold this position for __________ seconds. Repeat __________ times. Complete this exercise __________ times a day. Gastroc and soleus stretch, standing step This exercise stretches the muscles in the back of the lower leg. These muscles are in the upper calf (gastrocnemius) and the lower calf (soleus). 1. Stand with the ball of your left / right foot on a step. The ball of your foot is on the walking surface, right under your toes. 2. Keep your other foot firmly on the same step. 3. Hold on to the wall or a railing for balance. 4. Slowly lift your other foot, allowing your body weight to press your left / right heel down over the edge of the step. You should feel a stretch in your left / right calf. 5. Hold this position for __________ seconds. 6. Return both feet to the step. 7. Repeat this exercise with a slight bend in your left / right knee. Repeat __________ times with your left / right knee straight and __________ times with your left / right knee bent. Complete this exercise __________ times a day. Balance exercise This exercise builds your balance and strength control of your arch to help take pressure off your plantar fascia. Single leg stand If this exercise is too easy, you can try it with your eyes closed or while standing on a pillow. 1.   Without shoes, stand near a railing or in a doorway. You may hold on to the railing or door frame as needed. 2. Stand on your left / right foot. Keep your big toe down on the floor and try to keep your arch lifted. Do not let your foot roll inward. 3. Hold this position for __________ seconds. Repeat __________ times. Complete this exercise __________ times a day. This information is not intended to replace advice given to you by your health care provider. Make sure  you discuss any questions you have with your health care provider. Document Revised: 10/01/2018 Document Reviewed: 04/08/2018 Elsevier Patient Education  2020 [employer] Plantar Fasciitis  Plantar fasciitis is a painful foot condition that affects the heel. It occurs when the band of tissue that connects the toes to the heel bone (plantar fascia) becomes irritated. This can happen as the result of exercising too much or doing other repetitive activities (overuse injury). The pain from plantar fasciitis can range from mild irritation to severe pain that makes it difficult to walk or move. The pain is usually worse in the morning after sleeping, or after sitting or lying down for a while. Pain may also be worse after long periods of walking or standing. What are the causes? This condition may be caused by:  Standing for long periods of time.  Wearing shoes that do not have good arch support.  Doing activities that put stress on joints (high-impact activities), including running, aerobics, and ballet.  Being overweight.  An abnormal way of walking (gait).  Tight muscles in the back of your lower leg (calf).  High arches in your feet.  Starting a new athletic activity. What are the signs or symptoms? The main symptom of this condition is heel pain. Pain may:  Be worse with first steps after a time of rest, especially in the morning after sleeping or after you have been sitting or lying down for a while.  Be worse after long periods of standing still.  Decrease after 30-45 minutes of activity, such as gentle walking. How is this diagnosed? This condition may be diagnosed based on your medical history and your symptoms. Your health care provider may ask questions about your activity level. Your health care provider will do a physical exam to check for:  A tender area on the bottom of your foot.  A high arch in your foot.  Pain when you move your foot.  Difficulty moving your  foot. You may have imaging tests to confirm the diagnosis, such as:  X-rays.  Ultrasound.  MRI. How is this treated? Treatment for plantar fasciitis depends on how severe your condition is. Treatment may include:  Rest, ice, applying pressure (compression), and raising the affected foot (elevation). This may be called RICE therapy. Your health care provider may recommend RICE therapy along with over-the-counter pain medicines to manage your pain.  Exercises to stretch your calves and your plantar fascia.  A splint that holds your foot in a stretched, upward position while you sleep (night splint).  Physical therapy to relieve symptoms and prevent problems in the future.  Injections of steroid medicine (cortisone) to relieve pain and inflammation.  Stimulating your plantar fascia with electrical impulses (extracorporeal shock wave therapy). This is usually the last treatment option before surgery.  Surgery, if other treatments have not worked after 12 months. Follow these instructions at home:  Managing pain, stiffness, and swelling  If directed, put ice on the painful area: ? Put ice   in a plastic bag, or use a frozen bottle of water. ? Place a towel between your skin and the bag or bottle. ? Roll the bottom of your foot over the bag or bottle. ? Do this for 20 minutes, 2-3 times a day.  Wear athletic shoes that have air-sole or gel-sole cushions, or try wearing soft shoe inserts that are designed for plantar fasciitis.  Raise (elevate) your foot above the level of your heart while you are sitting or lying down. Activity  Avoid activities that cause pain. Ask your health care provider what activities are safe for you.  Do physical therapy exercises and stretches as told by your health care provider.  Try activities and forms of exercise that are easier on your joints (low-impact). Examples include swimming, water aerobics, and biking. General instructions  Take  over-the-counter and prescription medicines only as told by your health care provider.  Wear a night splint while sleeping, if told by your health care provider. Loosen the splint if your toes tingle, become numb, or turn cold and blue.  Maintain a healthy weight, or work with your health care provider to lose weight as needed.  Keep all follow-up visits as told by your health care provider. This is important. Contact a health care provider if you:  Have symptoms that do not go away after caring for yourself at home.  Have pain that gets worse.  Have pain that affects your ability to move or do your daily activities. Summary  Plantar fasciitis is a painful foot condition that affects the heel. It occurs when the band of tissue that connects the toes to the heel bone (plantar fascia) becomes irritated.  The main symptom of this condition is heel pain that may be worse after exercising too much or standing still for a long time.  Treatment varies, but it usually starts with rest, ice, compression, and elevation (RICE therapy) and over-the-counter medicines to manage pain. This information is not intended to replace advice given to you by your health care provider. Make sure you discuss any questions you have with your health care provider. Document Revised: 05/23/2017 Document Reviewed: 04/07/2017 Elsevier Patient Education  2020 [employer]  

## 2020-06-27 ENCOUNTER — Telehealth: Payer: Self-pay

## 2020-06-27 NOTE — Telephone Encounter (Signed)
Patient is coming in tomorrow for cortisone shot for heel spur, wants to make sure he is able to walk on foot afterwards.  Explained to patient that he will be able to walk, may have some soreness.

## 2020-06-28 ENCOUNTER — Other Ambulatory Visit: Payer: Self-pay

## 2020-06-28 ENCOUNTER — Encounter: Payer: Self-pay | Admitting: Family Medicine

## 2020-06-28 ENCOUNTER — Ambulatory Visit (INDEPENDENT_AMBULATORY_CARE_PROVIDER_SITE_OTHER): Payer: Self-pay | Admitting: Family Medicine

## 2020-06-28 VITALS — BP 115/76 | HR 84 | Ht 78.0 in | Wt 252.0 lb

## 2020-06-28 DIAGNOSIS — M722 Plantar fascial fibromatosis: Secondary | ICD-10-CM

## 2020-06-28 MED ORDER — METHYLPREDNISOLONE ACETATE 40 MG/ML IJ SUSP: 40.0000 mg | Freq: Once | INTRAMUSCULAR | Status: DC

## 2020-06-28 NOTE — Progress Notes (Signed)
BP 115/76   Pulse 84   Ht 6\' 6"  (1.981 m)   Wt 252 lb (114.3 kg)   SpO2 97%   BMI 29.12 kg/m    Subjective:   Patient ID: , male    DOB: [birth year], 50 y.o.   MRN: [id number]  HPI: Timothy Nielsen is a 50 y.o. male presenting on 06/28/2020 for No chief complaint on file.   HPI Patient comes in complaining of continued right heel pain, he did a course of corticosteroids and anti-inflammatories and it did help when he was on them but it is come right back.  It hurts right near the base of his right heel on the plantar surface.  It hurts when he walks and is especially worse when he is at work and up on concrete surfaces.  He has tried to make sure that he has been doing icing and trying to make sure not walking barefoot with hard surfaces at home.  He just says it is worsening again like it was previously and not improving.  Relevant past medical, surgical, family and social history reviewed and updated as indicated. Interim medical history since our last visit reviewed. Allergies and medications reviewed and updated.  Review of Systems  Constitutional: Negative for chills and fever.  Respiratory: Negative for shortness of breath and wheezing.   Cardiovascular: Negative for chest pain and leg swelling.  Musculoskeletal: Positive for arthralgias. Negative for back pain and gait problem.  Skin: Negative for rash.  All other systems reviewed and are negative.   Per HPI unless specifically indicated above      Objective:   BP 115/76   Pulse 84   Ht 6\' 6"  (1.981 m)   Wt 252 lb (114.3 kg)   SpO2 97%   BMI 29.12 kg/m   Wt Readings from Last 3 Encounters:  06/28/20 252 lb (114.3 kg)  05/01/20 246 lb 12.8 oz (111.9 kg)  11/09/19 234 lb (106.1 kg)    Physical Exam Vitals and nursing note reviewed.  Constitutional:      General: He is not in acute distress.    Appearance: He is well-developed and well-nourished. He is not diaphoretic.  Eyes:     Extraocular  Movements: EOM normal.  Neck:     Thyroid: No thyromegaly.  Cardiovascular:     Pulses: Intact distal pulses.  Musculoskeletal:        General: No edema.     Right foot: Normal capillary refill. Tenderness (Point tenderness on plantar surface just distal to the calcaneus.) present. No bony tenderness or crepitus. Normal pulse.  Skin:    General: Skin is warm and dry.     Findings: No erythema or rash.  Neurological:     Mental Status: He is alert and oriented to person, place, and time.     Coordination: Coordination normal.  Psychiatric:        Mood and Affect: Mood and affect normal.        Behavior: Behavior normal.     Right plantar fasciitis injection: Consent form signed. Risk factors of bleeding and infection discussed with patient and patient is agreeable towards injection. Patient prepped with Betadine. Lateral approach towards injection used. Injected 80 mg of Depo-Medrol and 1 mL of 2% lidocaine. Patient tolerated procedure well and no side effects from noted. Minimal to no bleeding. Simple bandage applied after.   Assessment & Plan:   Problem List Items Addressed This Visit   None   Visit Diagnoses  Plantar fasciitis    -  Primary   Relevant Medications   methylPREDNISolone acetate (DEPO-MEDROL) injection 40 mg (Start on 06/28/2020 12:00 PM)      Will do steroid injection but it does not work we will get him going to podiatry. Follow up plan: Return if symptoms worsen or fail to improve.  Counseling provided for all of the vaccine components No orders of the defined types were placed in this encounter.   Arville Care, MD [facility] 06/28/2020, 11:46 AM

## 2020-08-16 ENCOUNTER — Telehealth: Payer: Self-pay

## 2020-08-16 NOTE — Telephone Encounter (Signed)
More issues with COPD = appt made

## 2020-08-18 ENCOUNTER — Other Ambulatory Visit: Payer: Self-pay

## 2020-08-18 ENCOUNTER — Encounter: Payer: Self-pay | Admitting: Family

## 2020-08-18 ENCOUNTER — Ambulatory Visit (INDEPENDENT_AMBULATORY_CARE_PROVIDER_SITE_OTHER): Payer: Self-pay | Admitting: Family

## 2020-08-18 ENCOUNTER — Ambulatory Visit (INDEPENDENT_AMBULATORY_CARE_PROVIDER_SITE_OTHER): Payer: Self-pay

## 2020-08-18 VITALS — BP 128/81 | HR 75 | Temp 97.5°F | Ht 78.0 in | Wt 256.2 lb

## 2020-08-18 DIAGNOSIS — J449 Chronic obstructive pulmonary disease, unspecified: Secondary | ICD-10-CM

## 2020-08-18 DIAGNOSIS — I700 Atherosclerosis of aorta: Secondary | ICD-10-CM

## 2020-08-18 DIAGNOSIS — J441 Chronic obstructive pulmonary disease with (acute) exacerbation: Secondary | ICD-10-CM

## 2020-08-18 DIAGNOSIS — K219 Gastro-esophageal reflux disease without esophagitis: Secondary | ICD-10-CM

## 2020-08-18 DIAGNOSIS — F17200 Nicotine dependence, unspecified, uncomplicated: Secondary | ICD-10-CM

## 2020-08-18 DIAGNOSIS — I10 Essential (primary) hypertension: Secondary | ICD-10-CM

## 2020-08-18 IMAGING — DX DG CHEST 2V
3 series · 3 of 3 positions shown · non-contrast
Comparison: [DATE]

CLINICAL DATA: COPD, short of breath

EXAM:
CHEST - 2 VIEW

[chest pa (1 of 2)]
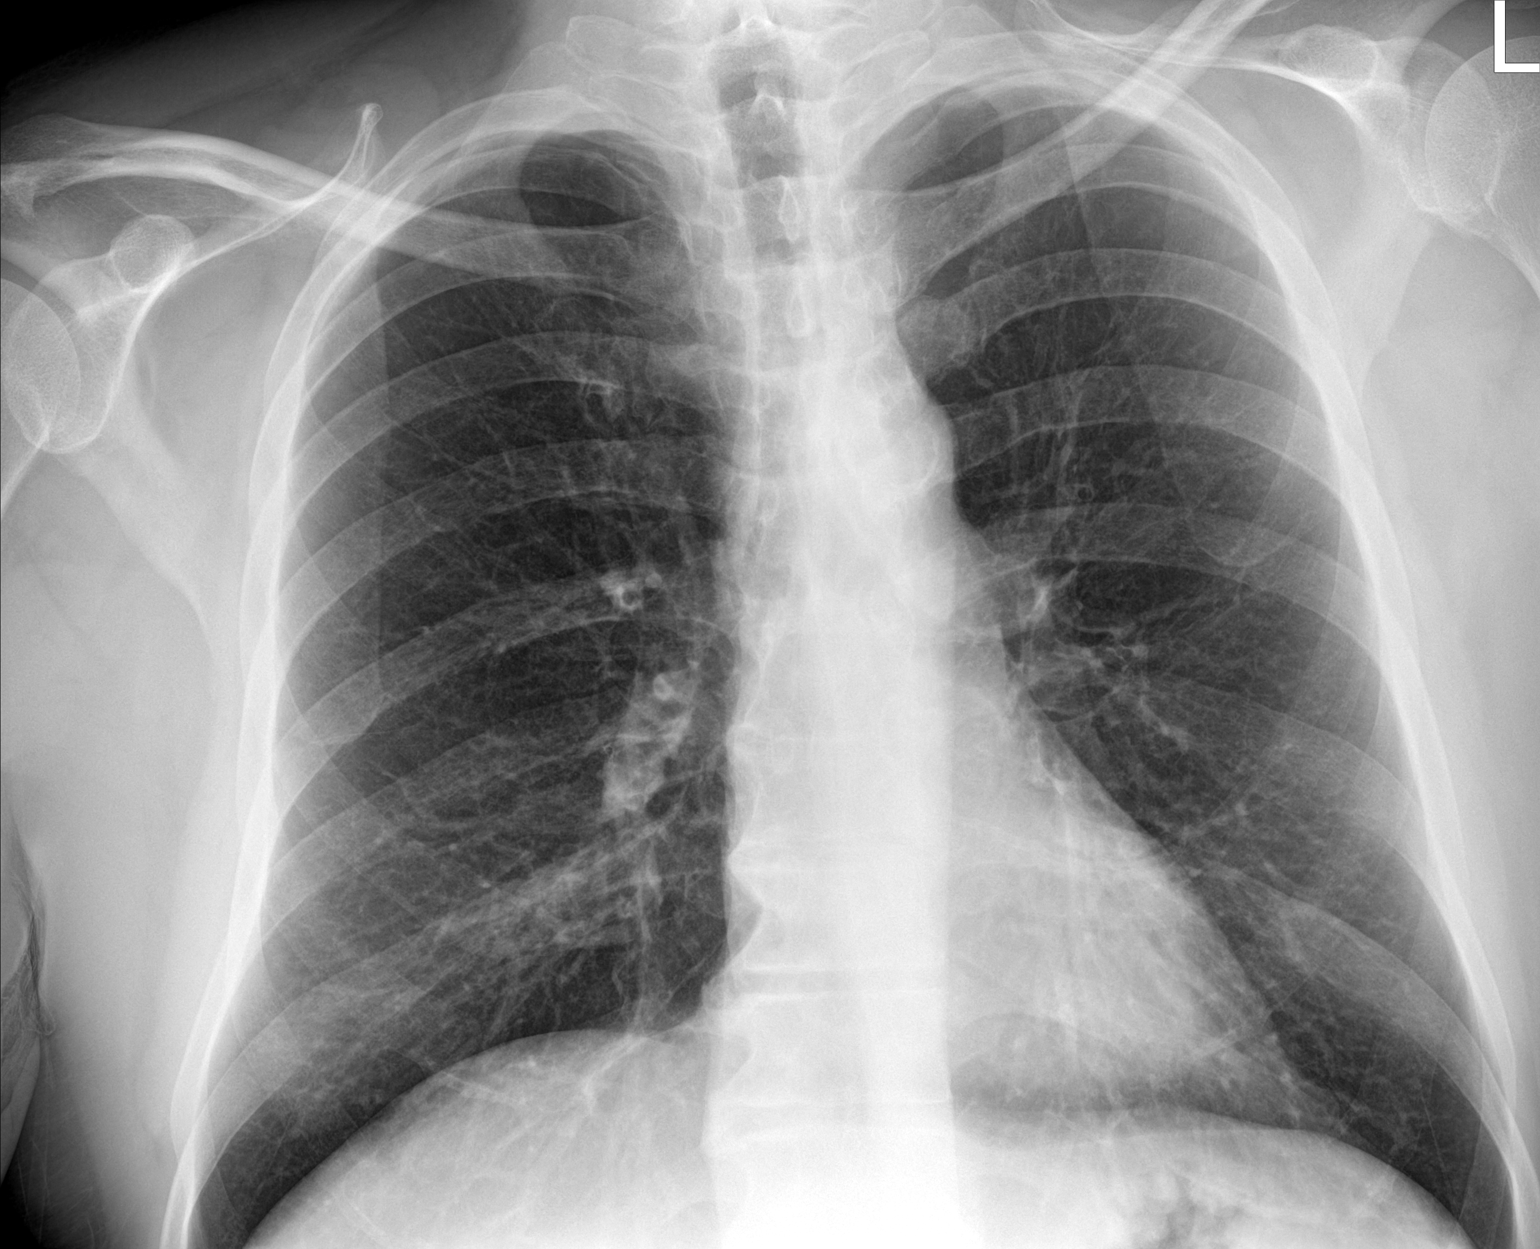

[chest lat]
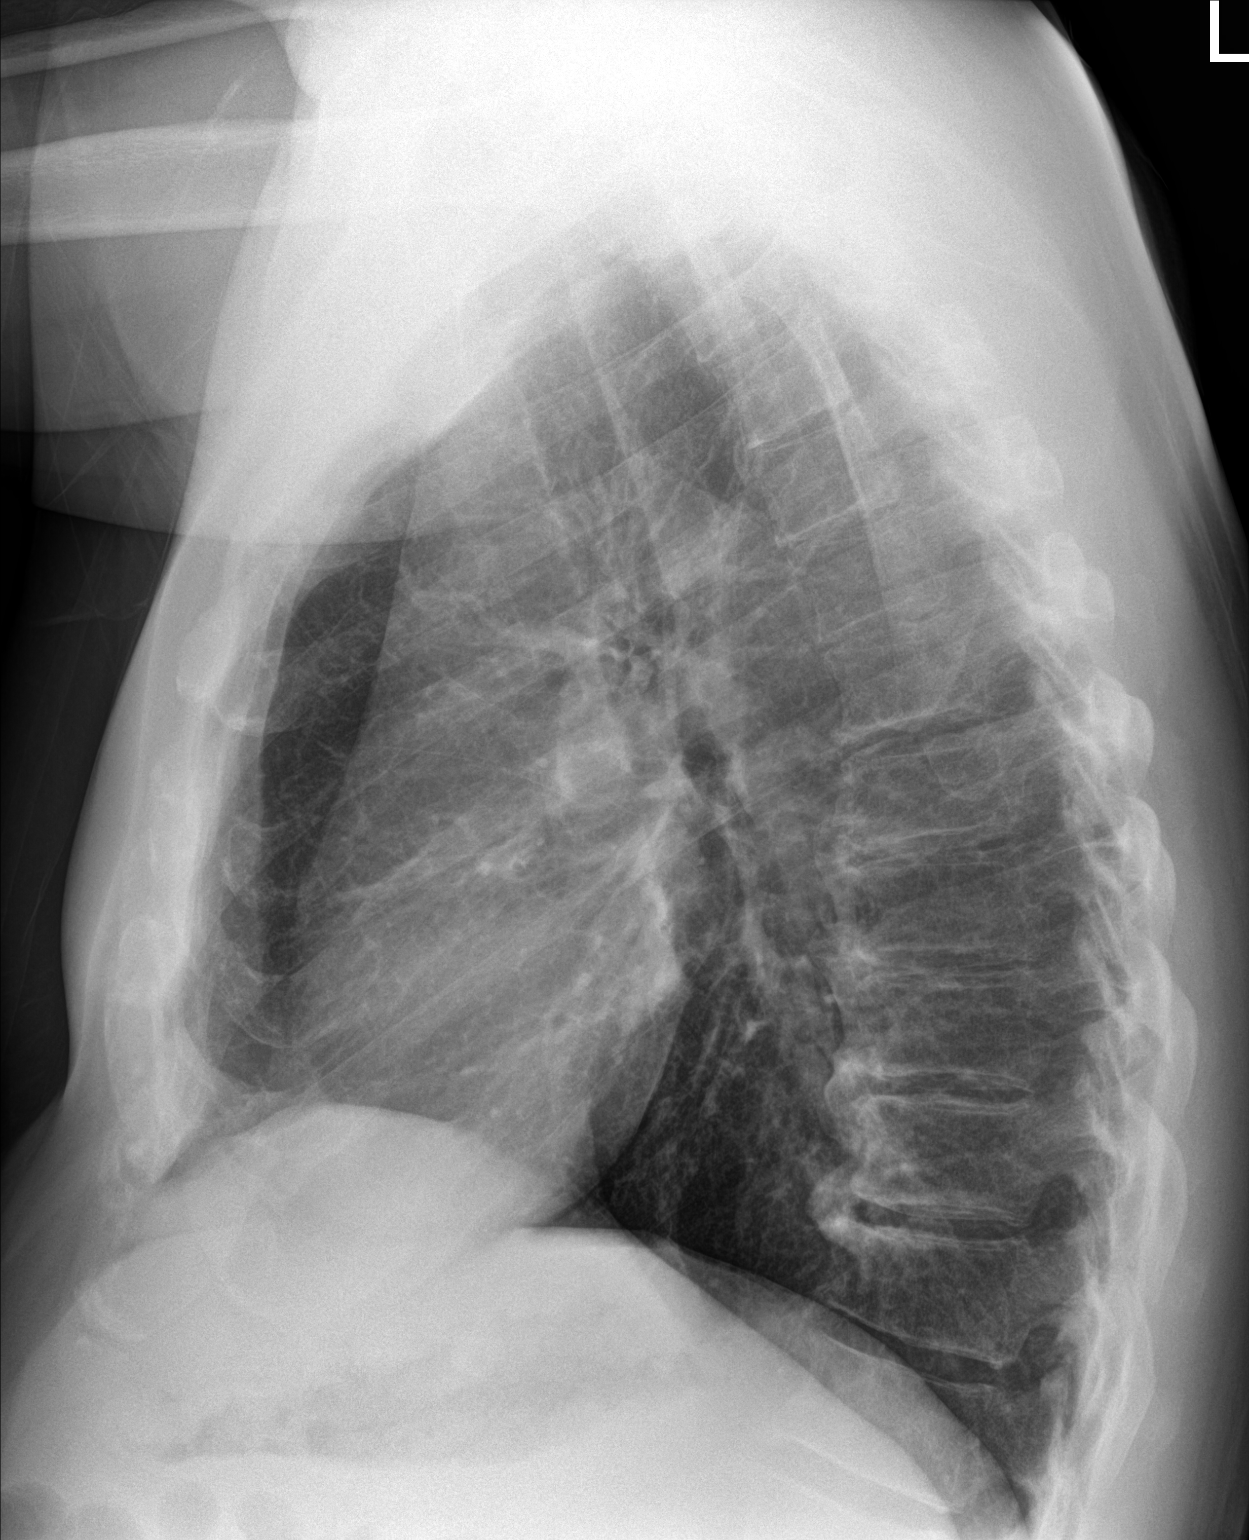

[chest pa (2 of 2)]
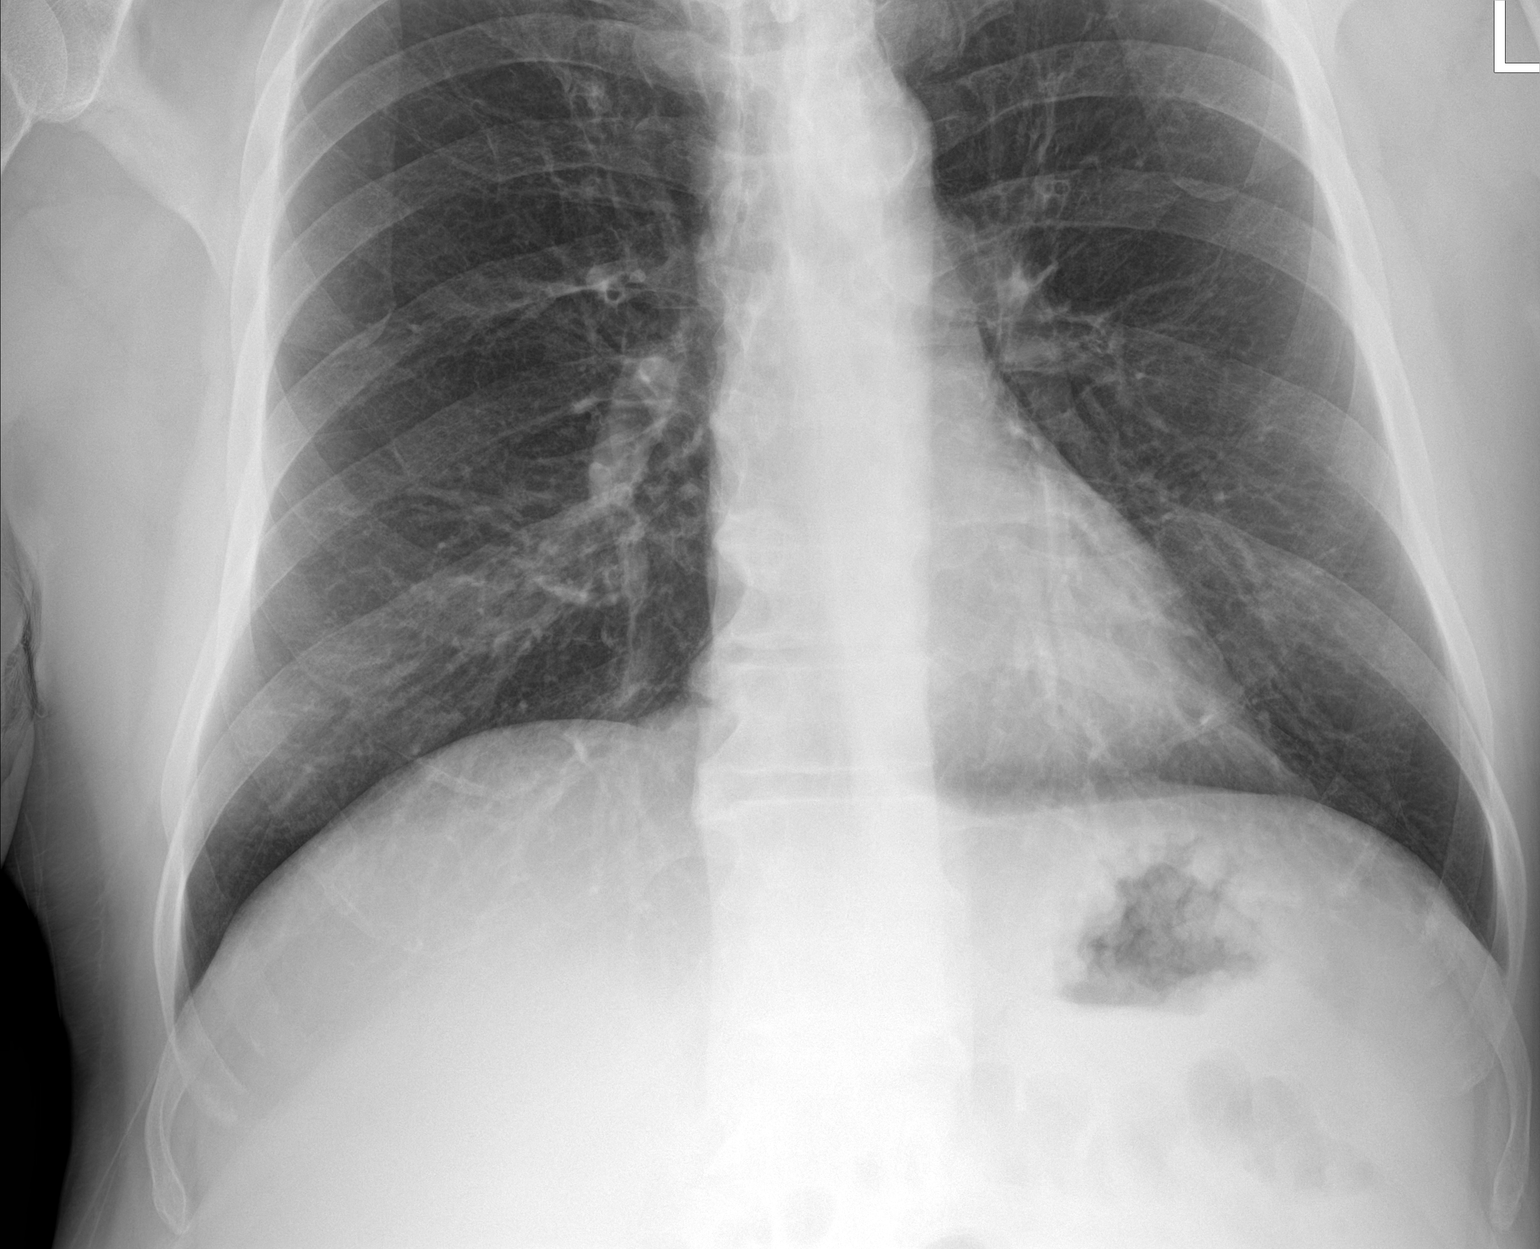

[3 of 3 positions shown; findings below may reference images not displayed]

FINDINGS: Frontal and lateral views of the chest demonstrate an unremarkable
cardiac silhouette. No airspace disease, effusion, or pneumothorax.
No acute bony abnormality.
IMPRESSION: 1. Stable exam, no acute process.

## 2020-08-18 MED ORDER — PREDNISONE 10 MG (21) PO TBPK: ORAL_TABLET | ORAL | 0 refills | Status: DC

## 2020-08-18 NOTE — Progress Notes (Signed)
Subjective:    Patient ID: Timothy Nielsen, male    DOB: 04/26/1971, 50 y.o.   MRN: [id number]  Chief Complaint  Patient presents with  . COPD  . Headache   PT presents to the office today for chronic follow up. Timothy Nielsen is still complaining of bilateral plantar fasciitis that is worsening. Timothy Nielsen does not have insurance and can not afford to see podiatry. Timothy Nielsen had an injection in his right foot on 06/28/20 that helped slightly for a few weeks.  COPD Timothy Nielsen complains of cough, shortness of breath and wheezing. This is a chronic problem. The current episode started more than 1 year ago. The problem occurs intermittently. The cough is non-productive. Associated symptoms include headaches, heartburn and malaise/fatigue. His symptoms are aggravated by any activity. Timothy Nielsen reports moderate improvement on treatment. His past medical history is significant for COPD.  Hypertension This is a chronic problem. The current episode started more than 1 year ago. The problem has been resolved since onset. The problem is controlled. Associated symptoms include headaches, malaise/fatigue and shortness of breath. Pertinent negatives include no peripheral edema. Risk factors for coronary artery disease include dyslipidemia, obesity and sedentary lifestyle. The current treatment provides moderate improvement.  Gastroesophageal Reflux Timothy Nielsen complains of belching, coughing, heartburn and wheezing. This is a chronic problem. The current episode started more than 1 year ago. The problem occurs occasionally. Risk factors include obesity. Timothy Nielsen has tried a PPI for the symptoms. The treatment provided moderate relief.  Nicotine Dependence Presents for follow-up visit. His urge triggers include company of smokers. The symptoms have been stable. Timothy Nielsen smokes < 1/2 a pack of cigarettes per day.  Headache  This is a new problem. The current episode started 1 to 4 weeks ago. The problem occurs intermittently. Associated symptoms include coughing. His past  medical history is significant for hypertension.      Review of Systems  Constitutional: Positive for malaise/fatigue.  Respiratory: Positive for cough, shortness of breath and wheezing.   Gastrointestinal: Positive for heartburn.  Neurological: Positive for headaches.  All other systems reviewed and are negative.      Objective:   Physical Exam Vitals reviewed.  Constitutional:      General: Timothy Nielsen is not in acute distress.    Appearance: Timothy Nielsen is well-developed and well-nourished.  HENT:     Head: Normocephalic.     Right Ear: External ear normal.     Left Ear: External ear normal.     Mouth/Throat:     Mouth: Oropharynx is clear and moist.  Eyes:     General:        Right eye: No discharge.        Left eye: No discharge.     Pupils: Pupils are equal, round, and reactive to light.  Neck:     Thyroid: No thyromegaly.  Cardiovascular:     Rate and Rhythm: Normal rate and regular rhythm.     Pulses: Intact distal pulses.     Heart sounds: Normal heart sounds. No murmur heard.   Pulmonary:     Effort: Pulmonary effort is normal. No respiratory distress.     Breath sounds: Rhonchi present. No wheezing.  Abdominal:     General: Bowel sounds are normal. There is no distension.     Palpations: Abdomen is soft.     Tenderness: There is no abdominal tenderness.  Musculoskeletal:        General: No tenderness or edema. Normal range of motion.     Cervical  back: Normal range of motion and neck supple.  Skin:    General: Skin is warm and dry.     Findings: No erythema or rash.  Neurological:     Mental Status: Timothy Nielsen is alert and oriented to person, place, and time.     Cranial Nerves: No cranial nerve deficit.     Deep Tendon Reflexes: Reflexes are normal and symmetric.  Psychiatric:        Mood and Affect: Mood and affect normal.        Behavior: Behavior normal.        Thought Content: Thought content normal.        Judgment: Judgment normal.       BP 128/81   Pulse 75    Temp (!) 97.5 F (36.4 C) (Temporal)   Ht '6\' 6"'  (1.981 m)   Wt 256 lb 3.2 oz (116.2 kg)   SpO2 98%   BMI 29.61 kg/m      Assessment & Plan:  Timothy Nielsen comes in today with chief complaint of COPD and Headache   Diagnosis and orders addressed:  1. Essential hypertension - CMP14+EGFR - CBC with Differential/Platelet  2. Aortic atherosclerosis (HCC) - CMP14+EGFR - CBC with Differential/Platelet  3. Chronic obstructive pulmonary disease, unspecified COPD type ([place]) - CMP14+EGFR - CBC with Differential/Platelet - DG Chest 2 View; Future  4. Gastroesophageal reflux disease, unspecified whether esophagitis present - CMP14+EGFR - CBC with Differential/Platelet  5. Current smoker - CMP14+EGFR - CBC with Differential/Platelet   6. COPD exacerbation (HCC) Prednisone dose pack  Labs pending Health Maintenance reviewed Diet and exercise encouraged  Follow up plan: 3 months   Evelina Dun, FNP

## 2020-08-18 NOTE — Patient Instructions (Signed)

## 2020-08-19 LAB — CMP14+EGFR
ALT: 55 IU/L — ABNORMAL HIGH (ref 0–44)
AST: 25 IU/L (ref 0–40)
Albumin/Globulin Ratio: 1.9 (ref 1.2–2.2)
Albumin: 4.8 g/dL (ref 3.8–4.9)
Alkaline Phosphatase: 117 IU/L (ref 44–121)
BUN/Creatinine Ratio: 10 (ref 9–20)
BUN: 10 mg/dL (ref 6–24)
Bilirubin Total: 0.3 mg/dL (ref 0.0–1.2)
CO2: 20 mmol/L (ref 20–29)
Calcium: 9.5 mg/dL (ref 8.7–10.2)
Chloride: 96 mmol/L (ref 96–106)
Creatinine, Ser: 0.97 mg/dL (ref 0.76–1.27)
GFR calc Af Amer: 104 mL/min/{1.73_m2} (ref >59)
GFR calc non Af Amer: 90 mL/min/{1.73_m2} (ref >59)
Globulin, Total: 2.5 g/dL (ref 1.5–4.5)
Glucose: 132 mg/dL — ABNORMAL HIGH (ref 65–99)
Potassium: 4.3 mmol/L (ref 3.5–5.2)
Sodium: 134 mmol/L (ref 134–144)
Total Protein: 7.3 g/dL (ref 6.0–8.5)

## 2020-08-19 LAB — CBC WITH DIFFERENTIAL/PLATELET
Basophils Absolute: 0.1 10*3/uL (ref 0.0–0.2)
Basos: 1 %
EOS (ABSOLUTE): 0.3 10*3/uL (ref 0.0–0.4)
Eos: 4 %
Hematocrit: 46.3 % (ref 37.5–51.0)
Hemoglobin: 16.2 g/dL (ref 13.0–17.7)
Immature Grans (Abs): 0 10*3/uL (ref 0.0–0.1)
Immature Granulocytes: 0 %
Lymphocytes Absolute: 1.9 10*3/uL (ref 0.7–3.1)
Lymphs: 24 %
MCH: 31.8 pg (ref 26.6–33.0)
MCHC: 35 g/dL (ref 31.5–35.7)
MCV: 91 fL (ref 79–97)
Monocytes Absolute: 1 10*3/uL — ABNORMAL HIGH (ref 0.1–0.9)
Monocytes: 14 %
Neutrophils Absolute: 4.4 10*3/uL (ref 1.4–7.0)
Neutrophils: 57 %
Platelets: 244 10*3/uL (ref 150–450)
RBC: 5.1 x10E6/uL (ref 4.14–5.80)
RDW: 12.4 % (ref 11.6–15.4)
WBC: 7.7 10*3/uL (ref 3.4–10.8)

## 2020-09-11 ENCOUNTER — Encounter: Payer: Self-pay | Admitting: *Deleted

## 2020-10-27 ENCOUNTER — Other Ambulatory Visit: Payer: Self-pay | Admitting: Family Medicine

## 2020-10-27 ENCOUNTER — Encounter: Payer: Self-pay | Admitting: Family Medicine

## 2021-02-19 ENCOUNTER — Telehealth: Payer: Self-pay

## 2021-02-19 NOTE — Telephone Encounter (Signed)
Client newly enrolled in [facility]. Called client to assist in scheduling his first appointment to establish primary medical care with [facility]. Appointment made for 03/01/21 at 0930. Client is to bring his current medications and paperwork for assistance he is currently getting with his inhalers from drug manufacturer.  Plan: will contact client after his first appointment to follow up and assess for any further needs. Client is agreeable. [facility].  Francee Nodal RN [employer]

## 2021-03-01 ENCOUNTER — Encounter: Payer: Self-pay | Admitting: Physician Assistant

## 2021-03-01 ENCOUNTER — Ambulatory Visit: Payer: Medicaid Other | Admitting: Physician Assistant

## 2021-03-01 VITALS — BP 129/70 | HR 88 | Temp 97.5°F | Ht 78.0 in | Wt 261.0 lb

## 2021-03-01 DIAGNOSIS — E785 Hyperlipidemia, unspecified: Secondary | ICD-10-CM

## 2021-03-01 DIAGNOSIS — Z720 Tobacco use: Secondary | ICD-10-CM

## 2021-03-01 DIAGNOSIS — J449 Chronic obstructive pulmonary disease, unspecified: Secondary | ICD-10-CM

## 2021-03-01 DIAGNOSIS — R200 Anesthesia of skin: Secondary | ICD-10-CM

## 2021-03-01 DIAGNOSIS — Z125 Encounter for screening for malignant neoplasm of prostate: Secondary | ICD-10-CM

## 2021-03-01 DIAGNOSIS — Z7689 Persons encountering health services in other specified circumstances: Secondary | ICD-10-CM

## 2021-03-01 DIAGNOSIS — R2689 Other abnormalities of gait and mobility: Secondary | ICD-10-CM

## 2021-03-01 DIAGNOSIS — I10 Essential (primary) hypertension: Secondary | ICD-10-CM

## 2021-03-01 DIAGNOSIS — R7989 Other specified abnormal findings of blood chemistry: Secondary | ICD-10-CM

## 2021-03-01 MED ORDER — ALBUTEROL SULFATE HFA 108 (90 BASE) MCG/ACT IN AERS: 2.0000 | INHALATION_SPRAY | Freq: Four times a day (QID) | RESPIRATORY_TRACT | 0 refills | Status: DC | PRN

## 2021-03-01 MED ORDER — LISINOPRIL-HYDROCHLOROTHIAZIDE 20-12.5 MG PO TABS: 1.0000 | ORAL_TABLET | Freq: Every day | ORAL | 0 refills | Status: DC

## 2021-03-01 MED ORDER — OMEPRAZOLE 40 MG PO CPDR: 40.0000 mg | DELAYED_RELEASE_CAPSULE | Freq: Every day | ORAL | 0 refills | Status: DC

## 2021-03-01 NOTE — Progress Notes (Signed)
BP 129/70   Pulse 88   Temp (!) 97.5 F (36.4 C)   Ht 6\' 6"  (1.981 m)   Wt 261 lb (118.4 kg)   SpO2 96%   BMI 30.16 kg/m    Subjective:    Patient ID: , male    DOB: 1971/04/01, 50 y.o.   MRN: [id number]  HPI: Timothy Nielsen is a 50 y.o. male presenting on 03/01/2021 for New Patient (Initial Visit)   HPI   Pt had a negative covid 19 screening questionnaire.   Chief Complaint  Patient presents with   New Patient (Initial Visit)    Pt is 50yoM who presents to establish care.    Pt reports copd, some sob.  He says he has about 2 months left of breo.  His albuterol is expired.   He mostly quit smoking 6 months ago but does report occassional smoking.   He doesn't consider himself alcoholic.  He Drinks 18/week  Pt is Not working.  He Previously worked for a 05/01/2021.  He says he quit because his feet hurt.  He reports Some constipation.  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  He says he has Numbness in feet and toes.  He says it Gets worse as the day goes on.       Relevant past medical, surgical, family and social history reviewed and updated as indicated. Interim medical history since our last visit reviewed. Allergies and medications reviewed and updated.    Current Outpatient Medications:    albuterol (VENTOLIN HFA) 108 (90 Base) MCG/ACT inhaler, Inhale 2 puffs into the lungs every 6 (six) hours as needed for wheezing or shortness of breath., Disp: 18 g, Rfl: 12   fluticasone furoate-vilanterol (BREO ELLIPTA) 100-25 MCG/INH AEPB, Inhale 1 puff into the lungs daily., Disp: 3 each, Rfl: 4   lisinopril-hydrochlorothiazide (ZESTORETIC) 20-12.5 MG tablet, Take 1 tablet by mouth daily., Disp: 90 tablet, Rfl: 3   omeprazole (PRILOSEC) 20 MG capsule, Take 20 mg by mouth daily., Disp: , Rfl:    simvastatin (ZOCOR) 20 MG tablet, Take 1  tablet (20 mg total) by mouth at bedtime., Disp: 90 tablet, Rfl: 3  Current Facility-Administered Medications:    methylPREDNISolone acetate (DEPO-MEDROL) injection 40 mg, 40 mg, Intramuscular, Once, Dettinger, Building control surveyor, MD     Review of Systems  Per HPI unless specifically indicated above     Objective:    BP 129/70   Pulse 88   Temp (!) 97.5 F (36.4 C)   Ht 6\' 6"  (1.981 m)   Wt 261 lb (118.4 kg)   SpO2 96%   BMI 30.16 kg/m   Wt Readings from Last 3 Encounters:  03/01/21 261 lb (118.4 kg)  08/18/20 256 lb 3.2 oz (116.2 kg)  06/28/20 252 lb (114.3 kg)    Physical Exam Vitals reviewed.  Constitutional:      General: He is not in acute distress.    Appearance: He is well-developed. He is not ill-appearing.  HENT:     Head: Normocephalic and atraumatic.     Right Ear: Tympanic membrane, ear canal and external ear normal.     Left Ear: Tympanic membrane, ear canal and external ear normal.  Eyes:     Extraocular Movements: Extraocular movements intact.     Conjunctiva/sclera: Conjunctivae normal.  Pupils: Pupils are equal, round, and reactive to light.  Neck:     Thyroid: No thyromegaly.  Cardiovascular:     Rate and Rhythm: Normal rate and regular rhythm.  Pulmonary:     Effort: Pulmonary effort is normal.     Breath sounds: Normal breath sounds. No wheezing or rales.  Abdominal:     General: Bowel sounds are normal.     Palpations: Abdomen is soft. There is no mass.     Tenderness: There is no abdominal tenderness.  Musculoskeletal:     Cervical back: Neck supple.     Right lower leg: No edema.     Left lower leg: No edema.  Lymphadenopathy:     Cervical: No cervical adenopathy.  Skin:    General: Skin is warm and dry.     Findings: No rash.  Neurological:     Mental Status: He is alert and oriented to person, place, and time.     Motor: No weakness or tremor.     Gait: Gait abnormal.     Comments: Pt ambulates with cane  Psychiatric:         Attention and Perception: Attention normal.        Speech: Speech normal.        Behavior: Behavior normal. Behavior is cooperative.          Assessment & Plan:    Encounter Diagnoses  Name Primary?   Encounter to establish care Yes   Chronic obstructive pulmonary disease, unspecified COPD type (HCC)    Primary hypertension    Hyperlipidemia, unspecified hyperlipidemia type    Elevated LFTs    Screening for prostate cancer    Tobacco use    Numbness    Balance problem       -will get baseline labs-  LFTs, lipids, psa -pt is given FIT test for colon cancer screening  -pt is counseled to Avoid alochol -Medication Rx sent to [facility] -Will discuss statin at follow up appt when reviewing labs -pt is encouraged to Avoid smoking -Refer to neurology for numbness and balance problems that he states results 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  He is to contact office sooner prn

## 2021-03-05 ENCOUNTER — Other Ambulatory Visit (HOSPITAL_COMMUNITY)
Admission: RE | Admit: 2021-03-05 | Discharge: 2021-03-05 | Disposition: A | Discharge status: Home / Self Care | Payer: Self-pay | Source: Ambulatory Visit | Attending: Physician Assistant | Admitting: Physician Assistant

## 2021-03-05 DIAGNOSIS — Z125 Encounter for screening for malignant neoplasm of prostate: Secondary | ICD-10-CM | POA: Insufficient documentation

## 2021-03-05 DIAGNOSIS — R7989 Other specified abnormal findings of blood chemistry: Secondary | ICD-10-CM | POA: Insufficient documentation

## 2021-03-05 DIAGNOSIS — E785 Hyperlipidemia, unspecified: Secondary | ICD-10-CM | POA: Insufficient documentation

## 2021-03-05 DIAGNOSIS — I10 Essential (primary) hypertension: Secondary | ICD-10-CM | POA: Insufficient documentation

## 2021-03-05 LAB — COMPREHENSIVE METABOLIC PANEL
ALT: 64 U/L — ABNORMAL HIGH (ref 0–44)
AST: 33 U/L (ref 15–41)
Albumin: 4.5 g/dL (ref 3.5–5.0)
Alkaline Phosphatase: 106 U/L (ref 38–126)
Anion gap: 10 (ref 5–15)
BUN: 11 mg/dL (ref 6–20)
CO2: 25 mmol/L (ref 22–32)
Calcium: 9.4 mg/dL (ref 8.9–10.3)
Chloride: 95 mmol/L — ABNORMAL LOW (ref 98–111)
Creatinine, Ser: 0.87 mg/dL (ref 0.61–1.24)
GFR, Estimated: >60 mL/min (ref >60)
Glucose, Bld: 121 mg/dL — ABNORMAL HIGH (ref 70–99)
Potassium: 4.5 mmol/L (ref 3.5–5.1)
Sodium: 130 mmol/L — ABNORMAL LOW (ref 135–145)
Total Bilirubin: 0.8 mg/dL (ref 0.3–1.2)
Total Protein: 7.6 g/dL (ref 6.5–8.1)

## 2021-03-05 LAB — LIPID PANEL
Cholesterol: 183 mg/dL (ref 0–200)
HDL: 39 mg/dL — ABNORMAL LOW (ref >40)
LDL Cholesterol: 106 mg/dL — ABNORMAL HIGH (ref 0–99)
Total CHOL/HDL Ratio: 4.7 RATIO
Triglycerides: 191 mg/dL — ABNORMAL HIGH (ref <150)
VLDL: 38 mg/dL (ref 0–40)

## 2021-03-05 LAB — PSA: Prostatic Specific Antigen: 1.01 ng/mL (ref 0.00–4.00)

## 2021-03-06 ENCOUNTER — Other Ambulatory Visit: Payer: Self-pay | Admitting: Physician Assistant

## 2021-03-06 DIAGNOSIS — Z1211 Encounter for screening for malignant neoplasm of colon: Secondary | ICD-10-CM

## 2021-03-06 LAB — IFOBT (OCCULT BLOOD): IFOBT: NEGATIVE

## 2021-03-07 ENCOUNTER — Telehealth: Payer: Self-pay

## 2021-03-07 NOTE — Telephone Encounter (Signed)
Client came by care connect office this morning for assistance with [facility] Financial assistance. He has an appointment Monday 03/12/21 a 0750. Client has all documents needed, will plan to return to the office 03/07/21 for his notarized letter of support with Abran Duke. Discussed upcoming appointment with Abran Duke and will plan to submit tomorrow for approval. Client called and left message to come in tomorrow to have his letter of support notarized.  While in office followed up with client [facility]. He states that appointment went very well and he is thankful for all the help.  Will continue to follow up to assure client has no other needs for resources.  Francee Nodal RN [employer]

## 2021-03-12 ENCOUNTER — Ambulatory Visit: Payer: Self-pay | Admitting: Neurology

## 2021-03-29 ENCOUNTER — Ambulatory Visit: Payer: Medicaid Other | Admitting: Physician Assistant

## 2021-03-29 ENCOUNTER — Ambulatory Visit (HOSPITAL_COMMUNITY)
Admission: RE | Admit: 2021-03-29 | Discharge: 2021-03-29 | Disposition: A | Discharge status: Home / Self Care | Payer: Self-pay | Source: Ambulatory Visit | Attending: Physician Assistant | Admitting: Physician Assistant

## 2021-03-29 ENCOUNTER — Other Ambulatory Visit: Payer: Self-pay

## 2021-03-29 ENCOUNTER — Encounter: Payer: Self-pay | Admitting: Physician Assistant

## 2021-03-29 VITALS — BP 119/70 | HR 97 | Temp 97.6°F | Wt 263.0 lb

## 2021-03-29 DIAGNOSIS — R2689 Other abnormalities of gait and mobility: Secondary | ICD-10-CM

## 2021-03-29 DIAGNOSIS — R0989 Other specified symptoms and signs involving the circulatory and respiratory systems: Secondary | ICD-10-CM

## 2021-03-29 DIAGNOSIS — R7989 Other specified abnormal findings of blood chemistry: Secondary | ICD-10-CM

## 2021-03-29 DIAGNOSIS — J449 Chronic obstructive pulmonary disease, unspecified: Secondary | ICD-10-CM | POA: Insufficient documentation

## 2021-03-29 DIAGNOSIS — Z720 Tobacco use: Secondary | ICD-10-CM

## 2021-03-29 DIAGNOSIS — E785 Hyperlipidemia, unspecified: Secondary | ICD-10-CM

## 2021-03-29 DIAGNOSIS — I10 Essential (primary) hypertension: Secondary | ICD-10-CM

## 2021-03-29 DIAGNOSIS — R200 Anesthesia of skin: Secondary | ICD-10-CM

## 2021-03-29 IMAGING — DX DG CHEST 2V
2 series · 2 of 2 positions shown · non-contrast
Comparison: [DATE].

CLINICAL DATA: Chest pain.

EXAM:
CHEST - 2 VIEW

[chest pa]
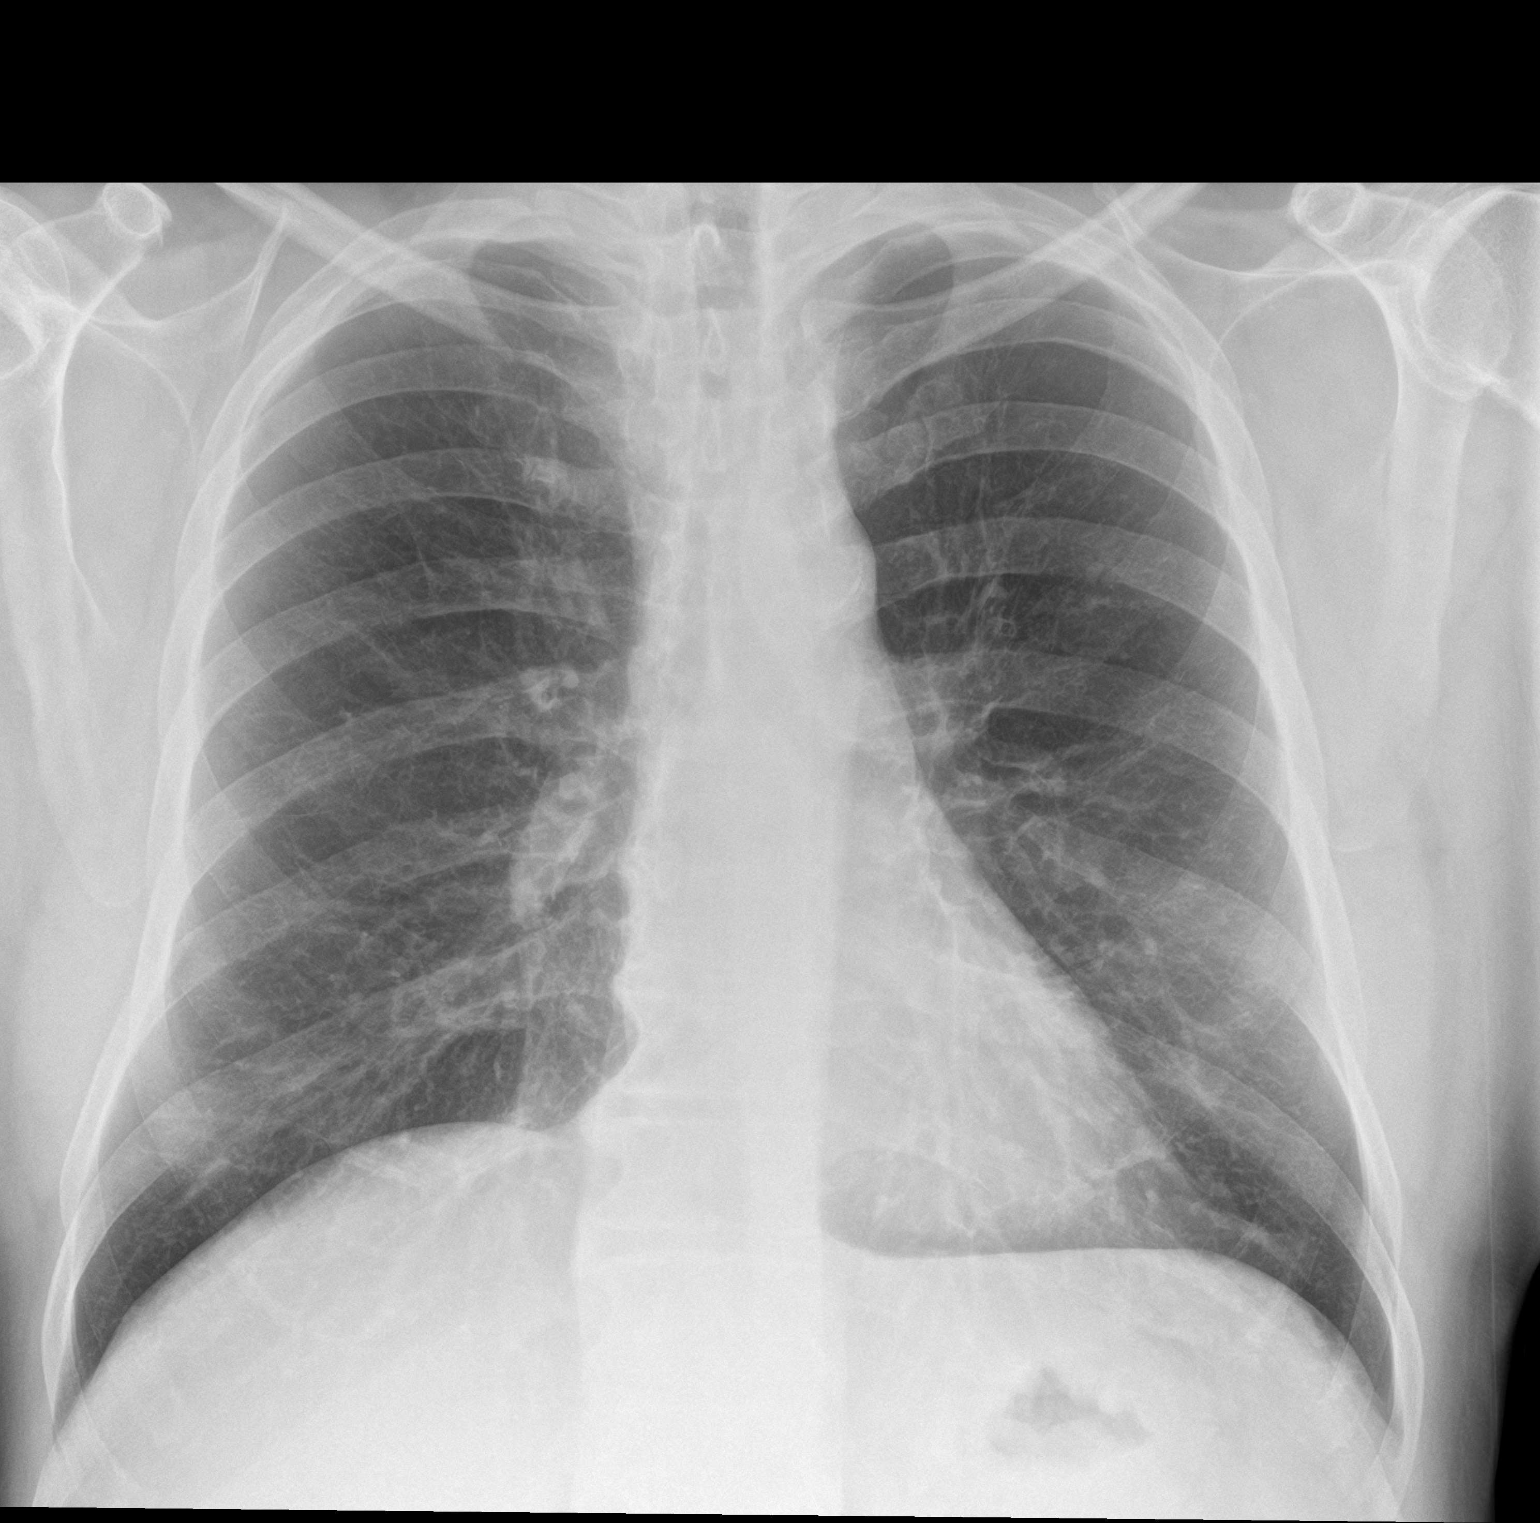

[chest lat]
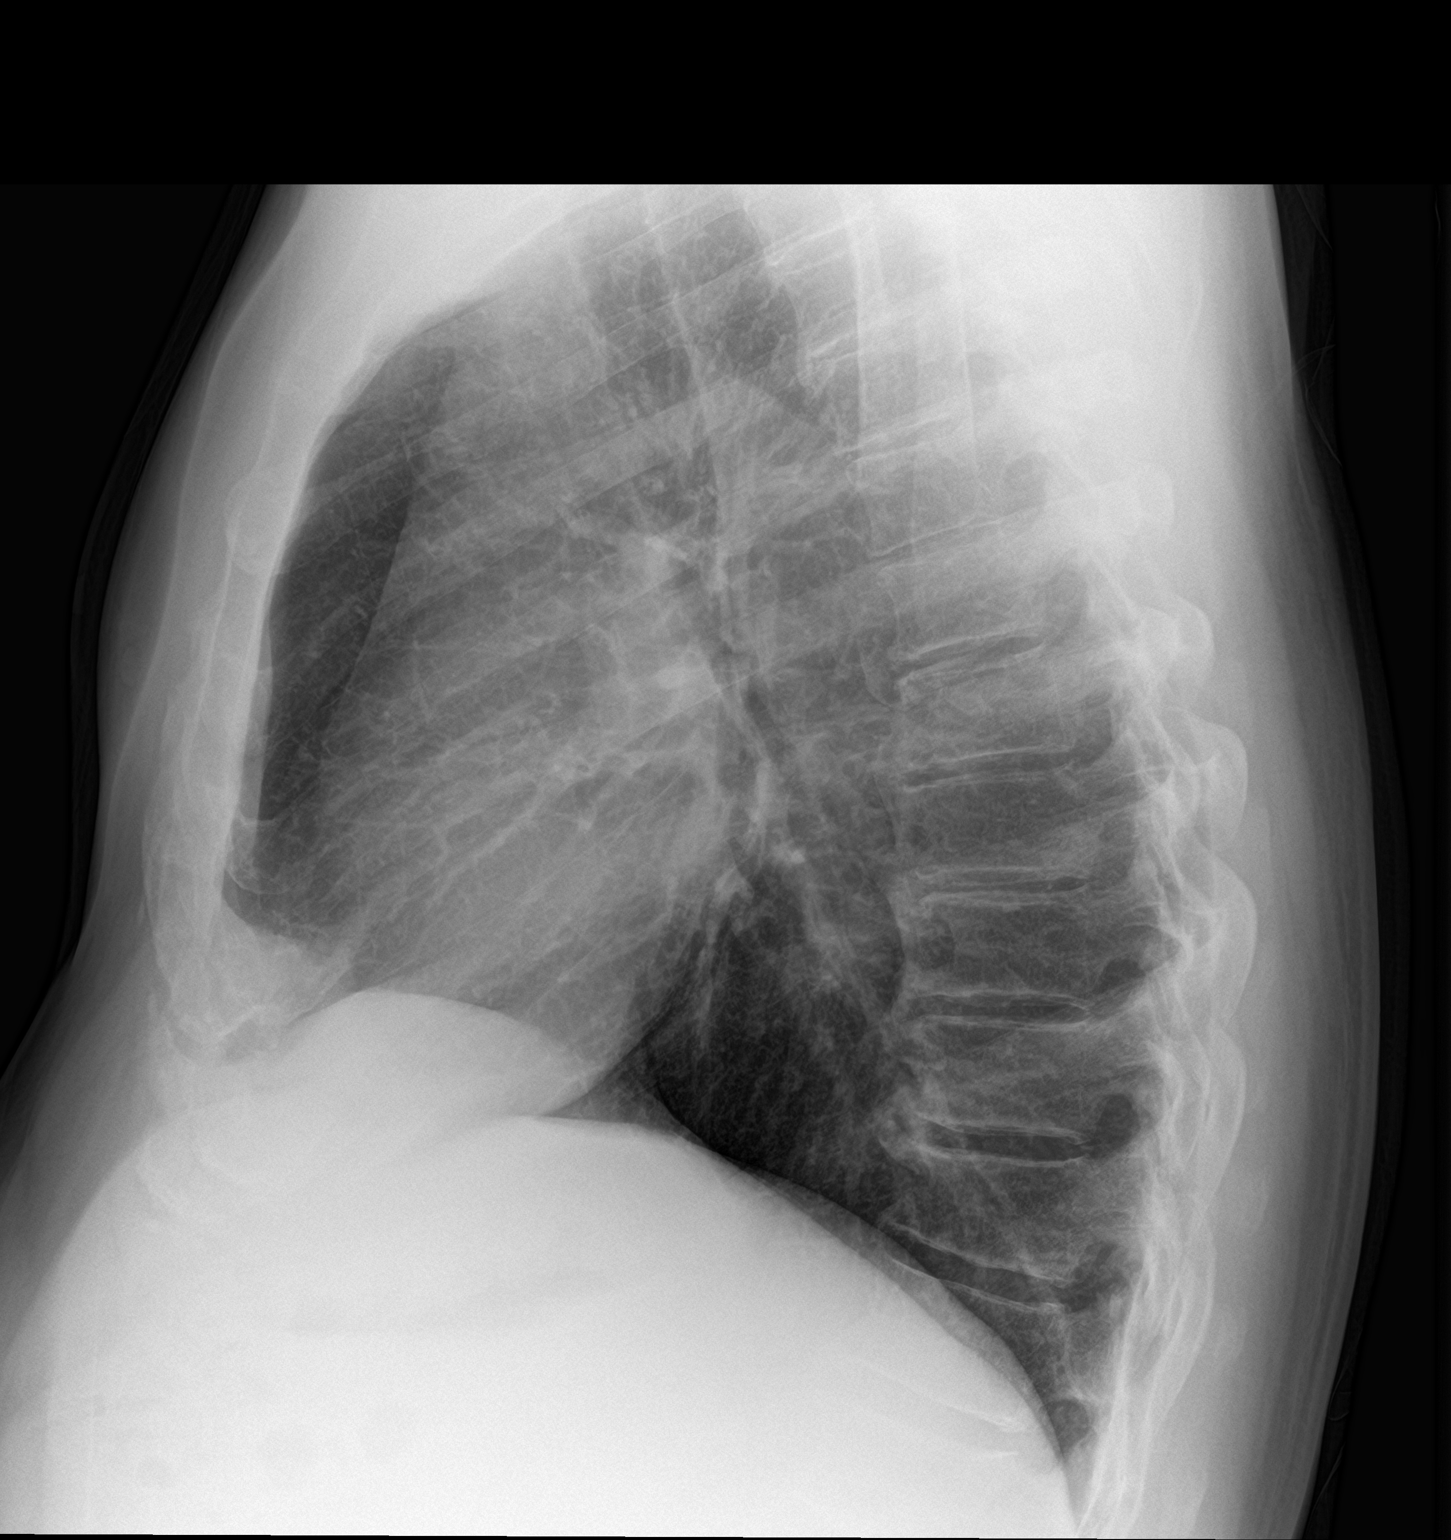

[2 of 2 positions shown; findings below may reference images not displayed]

FINDINGS: The heart size and mediastinal contours are within normal limits.
Both lungs are clear. The visualized skeletal structures are
unremarkable.
IMPRESSION: No active cardiopulmonary disease.

## 2021-03-29 MED ORDER — ATORVASTATIN CALCIUM 20 MG PO TABS: 20.0000 mg | ORAL_TABLET | Freq: Every day | ORAL | 1 refills | Status: DC

## 2021-03-29 NOTE — Progress Notes (Signed)
BP 119/70   Pulse 97   Temp 97.6 F (36.4 C)   Wt 263 lb (119.3 kg)   SpO2 98%   BMI 30.39 kg/m    Subjective:    Patient ID: Timothy Nielsen, male    DOB: 03-11-1971, 50 y.o.   MRN: [id number]  HPI: Timothy Nielsen is a 50 y.o. male presenting on 03/29/2021 for Follow-up   HPI    Pt had a negative covid 19 screening questionnaire.   Chief Complaint  Patient presents with   Follow-up    Pt is 50yoM who presents for routine follow up COPD and HTN.   He has stopped etoh  He is trying to cut back smoking  He has 1 month supply left on the breo.  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   Pt has no new problems today.      Relevant past medical, surgical, family and social history reviewed and updated as indicated. Interim medical history since our last visit reviewed. Allergies and medications reviewed and updated.   Current Outpatient Medications:    albuterol (VENTOLIN HFA) 108 (90 Base) MCG/ACT inhaler, Inhale 2 puffs into the lungs every 6 (six) hours as needed for wheezing or shortness of breath., Disp: 3 each, Rfl: 0   fluticasone furoate-vilanterol (BREO ELLIPTA) 100-25 MCG/INH AEPB, Inhale 1 puff into the lungs daily., Disp: 3 each, Rfl: 4   lisinopril-hydrochlorothiazide (ZESTORETIC) 20-12.5 MG tablet, Take 1 tablet by mouth daily., Disp: 90 tablet, Rfl: 0   omeprazole (PRILOSEC) 40 MG capsule, Take 1 capsule (40 mg total) by mouth daily., Disp: 90 capsule, Rfl: 0   simvastatin (ZOCOR) 20 MG tablet, Take 1 tablet (20 mg total) by mouth at bedtime., Disp: 90 tablet, Rfl: 3  Current Facility-Administered Medications:    methylPREDNISolone acetate (DEPO-MEDROL) injection 40 mg, 40 mg, Intramuscular, Once, Dettinger, Elige Radon, MD    Review of Systems  Per HPI unless specifically indicated above     Objective:    BP 119/70   Pulse 97    Temp 97.6 F (36.4 C)   Wt 263 lb (119.3 kg)   SpO2 98%   BMI 30.39 kg/m   Wt Readings from Last 3 Encounters:  03/29/21 263 lb (119.3 kg)  03/01/21 261 lb (118.4 kg)  08/18/20 256 lb 3.2 oz (116.2 kg)    Physical Exam Vitals reviewed.  Constitutional:      General: He is not in acute distress.    Appearance: He is well-developed. He is not toxic-appearing.  HENT:     Head: Normocephalic and atraumatic.  Cardiovascular:     Rate and Rhythm: Normal rate and regular rhythm.  Pulmonary:     Effort: Pulmonary effort is normal.     Breath sounds: Wheezing present.  Abdominal:     General: Bowel sounds are normal.     Palpations: Abdomen is soft.     Tenderness: There is no abdominal tenderness.  Musculoskeletal:     Cervical back: Neck supple.     Right lower leg: No edema.     Left lower leg: No edema.  Lymphadenopathy:     Cervical: No cervical adenopathy.  Skin:    General: Skin is warm and dry.  Neurological:     Mental Status: He is alert and oriented to person, place, and time.  Psychiatric:  Behavior: Behavior normal.      Results for orders placed or performed in visit on 03/06/21  IFOBT POC (occult bld, rslt in office)  Result Value Ref Range   IFOBT Negative       Assessment & Plan:   Encounter Diagnoses  Name Primary?   Chronic obstructive pulmonary disease, unspecified COPD type (HCC) Yes   Primary hypertension    Tobacco use    Abnormal lung sounds    Hyperlipidemia, unspecified hyperlipidemia type    Numbness    Balance problem    Elevated LFTs       -Reviewed Labs with pt -will add Lipitor for dyslipidema.  Rx sent to [facility].  He is counseled on lowfat diet -pt to Continue other meds (for htn and copd) -will Get cxr -He has cafa -pt to see Neurology in november as scheduled -Gave [employer] pt assistance application for breo.  He is to complete it and return it to the office -counseled Smoking cessation -congratulated him on  stopping etoh -pt to follow up 3 months.  He is to contact office sooner prn

## 2021-04-05 ENCOUNTER — Ambulatory Visit: Payer: Medicaid Other | Admitting: Physician Assistant

## 2021-04-05 ENCOUNTER — Encounter: Payer: Self-pay | Admitting: Physician Assistant

## 2021-04-05 DIAGNOSIS — J449 Chronic obstructive pulmonary disease, unspecified: Secondary | ICD-10-CM

## 2021-04-05 DIAGNOSIS — Z720 Tobacco use: Secondary | ICD-10-CM

## 2021-04-05 DIAGNOSIS — U071 COVID-19: Secondary | ICD-10-CM

## 2021-04-05 MED ORDER — NIRMATRELVIR/RITONAVIR (PAXLOVID)TABLET: 3.0000 | ORAL_TABLET | Freq: Two times a day (BID) | ORAL | 0 refills | Status: AC

## 2021-04-05 NOTE — Progress Notes (Signed)
   There were no vitals taken for this visit.   Subjective:    Patient ID: Timothy Nielsen, male    DOB: 11-18-1970, 50 y.o.   MRN: [id number]  HPI: Timothy Nielsen is a 50 y.o. male presenting on 04/05/2021 for No chief complaint on file.   HPI   This is a telemedicine appointment.  It is  via Telephone as pt doesn't have good enough signal to do a video appointment.  I connected with  [name] on 04/05/21 by a video enabled telemedicine application and verified that I am speaking with the correct person using two identifiers.   I discussed the limitations of evaluation and management by telemedicine. The patient expressed understanding and agreed to proceed.  Pt is at home.  Provider is at office    Pt has been Sick since Tuesday.    Feels drained, weak, has HA, cough, soreness, pressure with moving,  fever to 102 and chills  Pt has gotten covid vaccination including bivalent booster but too recently to afford benefit  He is a smoker but hasn't smoked in 3 days.  He is using his inahlers.  He denies sob   He took home covid test yesterday and again today and they were both positive.     Relevant past medical, surgical, family and social history reviewed and updated as indicated. Interim medical history since our last visit reviewed. Allergies and medications reviewed and updated.     Review of Systems  Per HPI unless specifically indicated above     Objective:    There were no vitals taken for this visit.  Wt Readings from Last 3 Encounters:  03/29/21 263 lb (119.3 kg)  03/01/21 261 lb (118.4 kg)  08/18/20 256 lb 3.2 oz (116.2 kg)    Physical Exam Pulmonary:     Effort: No respiratory distress.     Comments: Pt is talking in complete sentences without dyspnea Neurological:     Mental Status: He is oriented to person, place, and time.  Psychiatric:        Attention and Perception: Attention normal.        Speech: Speech normal.        Behavior: Behavior is  cooperative.         Assessment & Plan:    Encounter Diagnoses  Name Primary?   COVID-19 Yes   Chronic obstructive pulmonary disease, unspecified COPD type (HCC)    Tobacco use      -discussed with pt that he is at increased risk of severe covid due to his smoking and his copd.    He is urged to avoid smoking.   He is to continue to use his inhalers.  Will rx paxlovid.  Discussed with pt proper way  to take it.  He is to not take his atorvastatin or simvastatin while on paxlovid and for 5 days afterwards.      Pt is to rest, drink plenty of fluids, IBU or APAP prn.  Discussed reason he would need to go To ER (ie for sob or CP among others).  Discussed self isolation to prevent spreading virus to other.  Discussed with pt that he does not need to take any more covid tests.  All of pt questions were answered

## 2021-04-11 ENCOUNTER — Other Ambulatory Visit: Payer: Self-pay | Admitting: Physician Assistant

## 2021-04-11 MED ORDER — FLUTICASONE FUROATE-VILANTEROL 100-25 MCG/ACT IN AEPB: 1.0000 | INHALATION_SPRAY | Freq: Every day | RESPIRATORY_TRACT | 3 refills | Status: DC

## 2021-05-21 ENCOUNTER — Encounter: Payer: Self-pay | Admitting: Neurology

## 2021-05-21 ENCOUNTER — Other Ambulatory Visit (INDEPENDENT_AMBULATORY_CARE_PROVIDER_SITE_OTHER): Payer: Self-pay

## 2021-05-21 ENCOUNTER — Other Ambulatory Visit: Payer: Self-pay

## 2021-05-21 ENCOUNTER — Ambulatory Visit (INDEPENDENT_AMBULATORY_CARE_PROVIDER_SITE_OTHER): Payer: Self-pay | Admitting: Neurology

## 2021-05-21 VITALS — BP 126/86 | HR 101 | Ht 78.0 in | Wt 266.0 lb

## 2021-05-21 DIAGNOSIS — M4807 Spinal stenosis, lumbosacral region: Secondary | ICD-10-CM

## 2021-05-21 DIAGNOSIS — M48062 Spinal stenosis, lumbar region with neurogenic claudication: Secondary | ICD-10-CM

## 2021-05-21 DIAGNOSIS — M5412 Radiculopathy, cervical region: Secondary | ICD-10-CM

## 2021-05-21 DIAGNOSIS — R200 Anesthesia of skin: Secondary | ICD-10-CM

## 2021-05-21 LAB — B12 AND FOLATE PANEL
Folate: 12.4 ng/mL (ref >5.9)
Vitamin B-12: 168 pg/mL — ABNORMAL LOW (ref 211–911)

## 2021-05-21 NOTE — Progress Notes (Signed)
[facility] Neurology Division Clinic Note - Initial Visit   Date: 05/21/21  Timothy Nielsen MRN: [id number] DOB: 03-Oct-1970   Dear Timothy Hawking, PA-C:  Thank you for your kind referral of [facility] for consultation of bilateral feet pain. Although his history is well known to you, please allow [place] to reiterate it for the purpose of our medical record. The patient was accompanied to the clinic by self.    History of Present Illness: Timothy Nielsen is a 50 y.o. right-handed male with COPD, hypertension, hyperlipidemia, GERD, and tobacco use presenting for evaluation of bilateral feet pain.  For the past year, he has soreness at the soles of the feet which is worse in the morning and with prolonged standing and walking. When severe, he will also involve the calf, especially on the right leg.  If he rests, symptoms do get a little better.  He has some numbness involving the toes, which is constant.  No tingling.  He denies weakness.  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  He has applied for disability due to his feet pain.  He has not had ABI.  He walks with a cane since childhood because of spinal infection. He has imbalance which is worse of uneven ground.  He rarely falls.    He has neck pain for the past 3 years and started having low back pain this year.  He was seeing a chiropractor for his neck.  He was having shooting pain and numbness into his left arm with lifting. This has improved since he is no longer lifting. He does not have any prior imaging of the neck.   He is trying to quit smoking down to 4 cigarettes from a 1 PPD.   He drinks 12-pack of beer per week.    Out-side paper records, electronic medical record, and images have been reviewed where available and summarized as:  Lab Results  Component Value Date   HGBA1C 5.4 03/30/2019   No results found for: VITAMINB12 No results found for: TSH No results  found for: ESRSEDRATE, POCTSEDRATE  Past Medical History:  Diagnosis Date   Asthma    Hypertension     Past Surgical History:  Procedure Laterality Date   APPENDECTOMY     FRACTURE SURGERY Right    right knee- age 7     Medications:  Outpatient Encounter Medications as of 05/21/2021  Medication Sig   albuterol (VENTOLIN HFA) 108 (90 Base) MCG/ACT inhaler Inhale 2 puffs into the lungs every 6 (six) hours as needed for wheezing or shortness of breath.   atorvastatin (LIPITOR) 20 MG tablet Take 1 tablet (20 mg total) by mouth daily.   fluticasone furoate-vilanterol (BREO ELLIPTA) 100-25 MCG/ACT AEPB Inhale 1 puff into the lungs daily.   lisinopril-hydrochlorothiazide (ZESTORETIC) 20-12.5 MG tablet Take 1 tablet by mouth daily.   omeprazole (PRILOSEC) 40 MG capsule Take 1 capsule (40 mg total) by mouth daily.   simvastatin (ZOCOR) 20 MG tablet Take 1 tablet (20 mg total) by mouth at bedtime.   Facility-Administered Encounter Medications as of 05/21/2021  Medication   methylPREDNISolone acetate (DEPO-MEDROL) injection 40 mg    Allergies: No Known Allergies  Family History: Family History  Problem Relation Age of Onset   Diabetes Mother    Hypertension Mother    Alzheimer's disease Mother    Diabetes Father     Social History: Social History   Tobacco Use  Smoking status: Some Days    Packs/day: 0.25    Years: 15.00    Pack years: 3.75    Types: Cigarettes    Start date: 11/14/1986   Smokeless tobacco: Never   Tobacco comments:    Patient using patch to quit smoking. Down to about 4 cigarettes a day  Vaping Use   Vaping Use: Never used  Substance Use Topics   Alcohol use: Yes    Alcohol/week: 18.0 standard drinks    Types: 18 Cans of beer per week    Comment: 12 pack a week   Drug use: No   Social History   Social History Narrative   Right Handed    Lives in a one story home     Vital Signs:  BP 126/86   Pulse (!) 101   Ht 6\' 6"  (1.981 m)   Wt 266  lb (120.7 kg)   SpO2 97%   BMI 30.74 kg/m    Neurological Exam: MENTAL STATUS including orientation to time, place, person, recent and remote memory, attention span and concentration, language, and fund of knowledge is normal.  Speech is not dysarthric.  CRANIAL NERVES: II:  No visual field defects.  Unremarkable fundi.   III-IV-VI: Pupils equal round and reactive to light.  Normal conjugate, extra-ocular eye movements in all directions of gaze.  No nystagmus.  No ptosis.   V:  Normal facial sensation.    VII:  Normal facial symmetry and movements.   VIII:  Normal hearing and vestibular function.   IX-X:  Normal palatal movement.   XI:  Normal shoulder shrug and head rotation.   XII:  Normal tongue strength and range of motion, no deviation or fasciculation.  MOTOR:  No atrophy, fasciculations or abnormal movements.  No pronator drift.   Upper Extremity:  Right  Left  Deltoid  5/5   5/5   Biceps  5/5   5/5   Triceps  5/5   5/5   Infraspinatus 5/5  5/5  Medial pectoralis 5/5  5/5  Wrist extensors  5/5   5/5   Wrist flexors  5/5   5/5   Finger extensors  5/5   5/5   Finger flexors  5/5   5/5   Dorsal interossei  5/5   5/5   Abductor pollicis  5/5   5/5   Tone (Ashworth scale)  0  0   Lower Extremity:  Right  Left  Hip flexors  5/5   5/5   Hip extensors  5/5   5/5   Adductor 5/5  5/5  Abductor 5/5  5/5  Knee flexors  5/5   5/5   Knee extensors  5/5   5/5   Dorsiflexors  5/5   5/5   Plantarflexors  5/5   5/5   Toe extensors  5/5   5/5   Toe flexors  5/5   5/5   Tone (Ashworth scale)  0+  0+   MSRs:  Right        Left                  brachioradialis 2+  2+  biceps 2+  2+  triceps 2+  2+  patellar 2+  2+  ankle jerk 0  0  Hoffman no  no  plantar response down  down   SENSORY:  vibration and pin prick reduced at the toes bilaterally, normal in in the feet and legs. Rhomberg sign is positive.  COORDINATION/GAIT:  Normal finger-to- nose-finger.  Intact rapid  alternating movements bilaterally.  Gait is wide-based, assisted with a cane.  IMPRESSION: Exertional leg pain.  Possible lumbar canal stenosis with neurogenic claudication vs vascular claudication  - MRI lumbar spine wo contrast  - If imaging does not disclose compressive pathology, NCS/EMG is the next step  - May also consider ABI as he has risk factors for arterial disease  2.  Cervical radiculopathy  - MRI cervical spine  3.  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 Risk factors alcohol use  - Check vitamin B12, folate, vitamin B1, copper, SPEP with IFE  - Advised to cut back on alcohol  Further recommendations pending results.   Thank you for allowing me to participate in patient's care.  If I can answer any additional questions, I would be pleased to do so.    Sincerely,    Jaques Mineer K. Allena Katz, DO

## 2021-05-24 ENCOUNTER — Telehealth: Payer: Self-pay | Admitting: Neurology

## 2021-05-24 ENCOUNTER — Telehealth: Payer: Self-pay

## 2021-05-24 NOTE — Telephone Encounter (Signed)
Pt would like a call from mahina to discuss what needs to be done. He is confused about his appts

## 2021-05-24 NOTE — Telephone Encounter (Signed)
Called patient and he informed me that he got his MRI's scheduled for Dec 9th in [place] and he got what he needed from the message I left for him this morning. Patient had no further questions or concerns,

## 2021-05-24 NOTE — Telephone Encounter (Signed)
Called patient and informed him that he will have to contact [facility] for scheduling of his MRI's of the Cervical and Lumbar spine. Provided patient with their phone number of [phone]. Patient will call to schedule and had no further questions or concerns.

## 2021-05-25 LAB — IMMUNOFIXATION ELECTROPHORESIS
IgG (Immunoglobin G), Serum: 1288 mg/dL (ref 600–1640)
IgM, Serum: 104 mg/dL (ref 50–300)
Immunofix Electr Int: NOT DETECTED
Immunoglobulin A: <5 mg/dL — ABNORMAL LOW (ref 47–310)

## 2021-05-25 LAB — PROTEIN ELECTROPHORESIS, SERUM
Albumin ELP: 4.6 g/dL (ref 3.8–4.8)
Alpha 1: 0.3 g/dL (ref 0.2–0.3)
Alpha 2: 0.7 g/dL (ref 0.5–0.9)
Beta 2: 0.2 g/dL (ref 0.2–0.5)
Beta Globulin: 0.4 g/dL (ref 0.4–0.6)
Gamma Globulin: 1.1 g/dL (ref 0.8–1.7)
Total Protein: 7.4 g/dL (ref 6.1–8.1)

## 2021-05-25 LAB — COPPER, SERUM: Copper: 108 ug/dL (ref 70–175)

## 2021-05-25 LAB — VITAMIN B1: Vitamin B1 (Thiamine): 8 nmol/L (ref 8–30)

## 2021-05-29 ENCOUNTER — Telehealth: Payer: Self-pay | Admitting: Neurology

## 2021-05-29 NOTE — Telephone Encounter (Signed)
See Lab result note.

## 2021-05-29 NOTE — Telephone Encounter (Signed)
Pt said he missed a call from [place], just returning the call

## 2021-05-31 ENCOUNTER — Other Ambulatory Visit: Payer: Self-pay

## 2021-05-31 MED ORDER — "NEEDLE (DISP) 25G X 1-1/2"" MISC"
1 refills | Status: DC
  Filled 2021-05-31: qty 10, fill #0

## 2021-05-31 MED ORDER — "SYRINGE/NEEDLE (DISP) 21G X 1"" 3 ML MISC"
1 refills | Status: DC
  Filled 2021-05-31: qty 10, fill #0

## 2021-05-31 MED ORDER — CYANOCOBALAMIN 1000 MCG/ML IJ SOLN
INTRAMUSCULAR | 2 refills | Status: DC
Start: 2021-05-31 — End: 2021-10-29
  Filled 2021-05-31: qty 10, 30d supply, fill #0

## 2021-05-31 NOTE — Telephone Encounter (Signed)
B12 Vials and needles/syringes will need to be sent to [facility] for patient who is [facility]. Called [facility] and was informed by a staff member that patients with no insurance just have to pay a co pay. She was unable to let me know the cost because it varies.

## 2021-06-01 ENCOUNTER — Other Ambulatory Visit: Payer: Self-pay

## 2021-06-01 ENCOUNTER — Telehealth: Payer: Self-pay | Admitting: Neurology

## 2021-06-01 ENCOUNTER — Ambulatory Visit (HOSPITAL_COMMUNITY)
Admission: RE | Admit: 2021-06-01 | Discharge: 2021-06-01 | Disposition: A | Discharge status: Home / Self Care | Payer: Self-pay | Source: Ambulatory Visit | Attending: Neurology | Admitting: Neurology

## 2021-06-01 DIAGNOSIS — M5412 Radiculopathy, cervical region: Secondary | ICD-10-CM | POA: Insufficient documentation

## 2021-06-01 DIAGNOSIS — M4807 Spinal stenosis, lumbosacral region: Secondary | ICD-10-CM | POA: Insufficient documentation

## 2021-06-01 DIAGNOSIS — M48062 Spinal stenosis, lumbar region with neurogenic claudication: Secondary | ICD-10-CM | POA: Insufficient documentation

## 2021-06-01 IMAGING — MR MR LUMBAR SPINE W/O CM
5 series · 31 of 48 positions shown · non-contrast
Comparison: Lumbar spine radiograph [DATE]

CLINICAL DATA: Lumbosacral spinal stenosis. Numbness. Pain in the
neck, back, and lower extremities with difficulty walking.

EXAM:
MRI LUMBAR SPINE WITHOUT CONTRAST
TECHNIQUE: Multiplanar, multisequence MR imaging of the lumbar spine was
performed. No intravenous contrast was administered.

[Series 5: T2 · sagittal · 4.0mm · 0.73mm/px · 6 of 19 slices shown (1 of 2)]
[im 1/19]
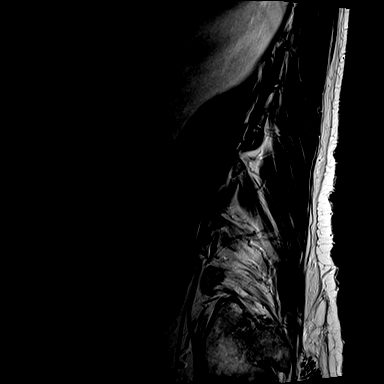
[im 4/19]
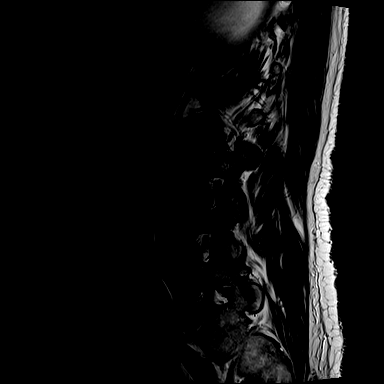
[im 8/19]
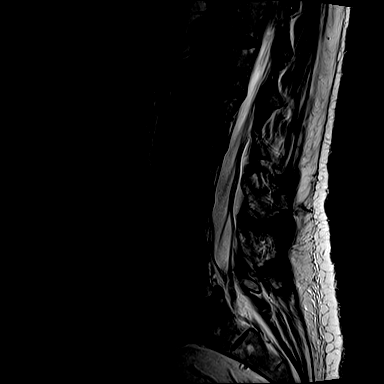
[im 11/19]
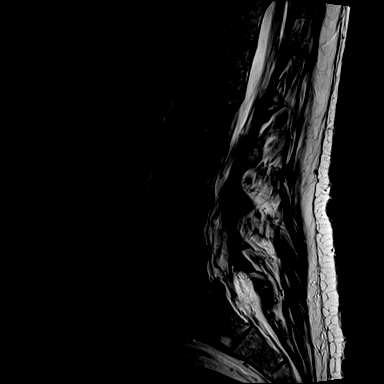
[im 15/19]
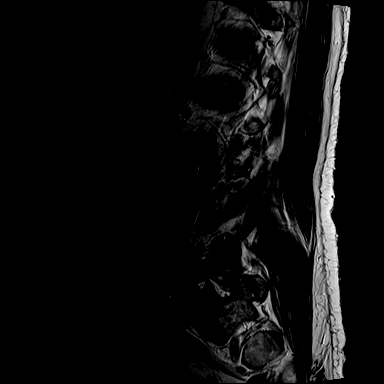
[im 19/19]
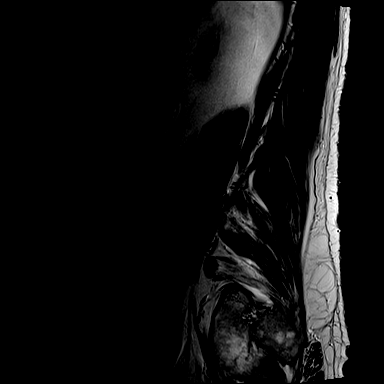

[Series 6: T1 · sagittal · 4.0mm · 0.81mm/px · 7 of 19 slices shown (1 of 2)]
[im 1/19]
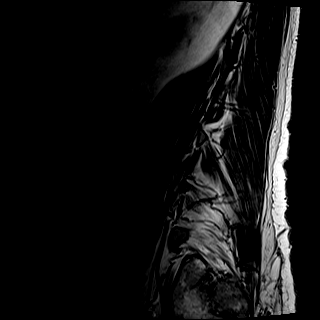
[im 4/19]
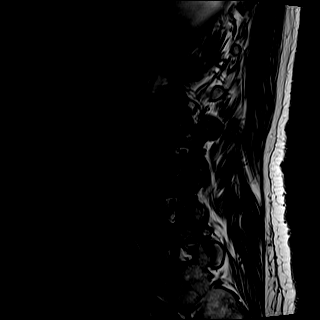
[im 7/19]
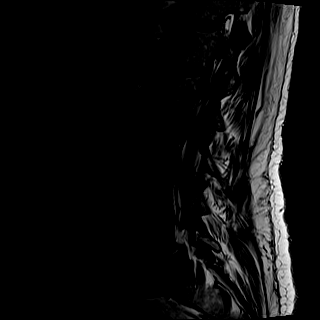
[im 10/19]
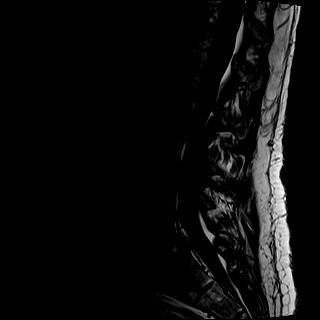
[im 13/19]
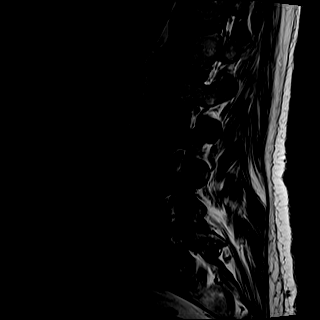
[im 16/19]
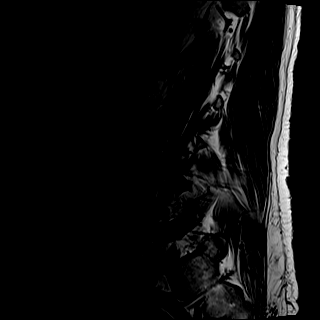
[im 19/19]
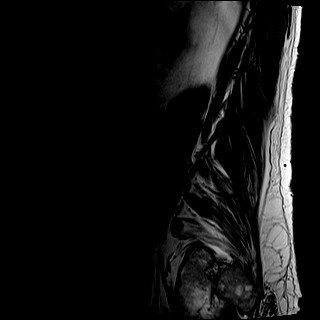

[Series 7: STIR · sagittal · 4.0mm · 0.51mm/px · 2 of 19 slices shown]
[im 1/19]
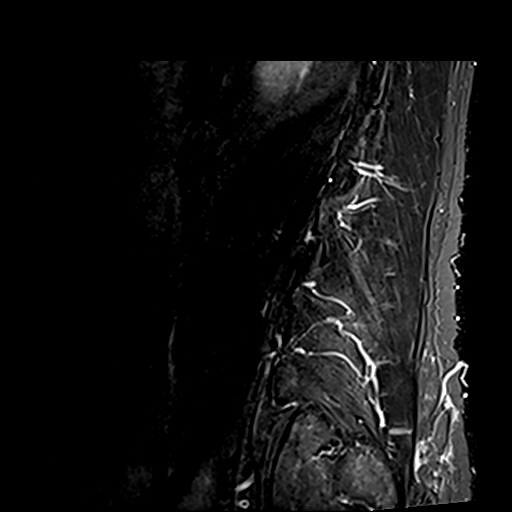
[im 4/19]
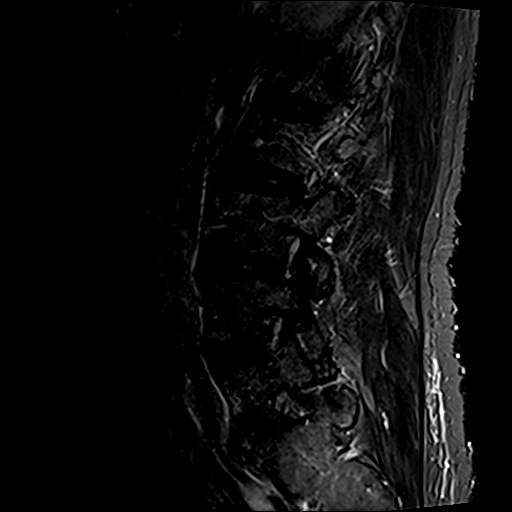

[Series 8: T2 · axial · 4.0mm · 0.70mm/px · z∈[-642,-376]mm · 8 of 41 slices shown (2 of 2)]
[im 1/41]
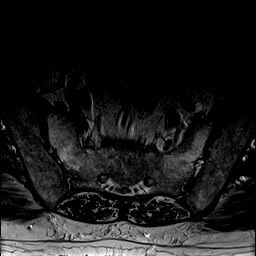
[im 7/41]
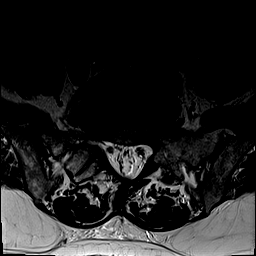
[im 13/41]
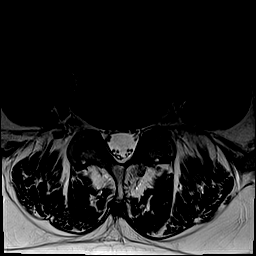
[im 19/41]
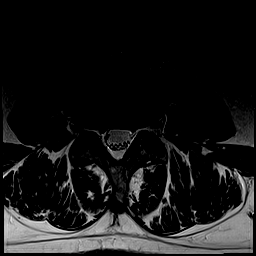
[im 22/41]
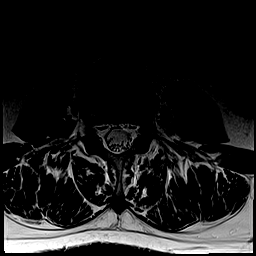
[im 28/41]
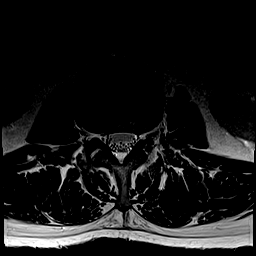
[im 34/41]
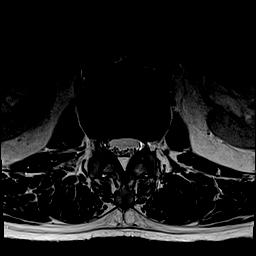
[im 41/41]
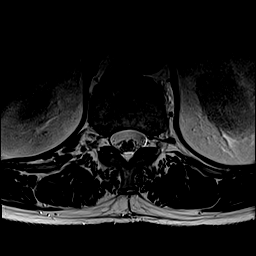

[Series 9: T1 · axial · 4.0mm · 0.35mm/px · z∈[-642,-376]mm · 8 of 41 slices shown (2 of 2)]
[im 1/41]
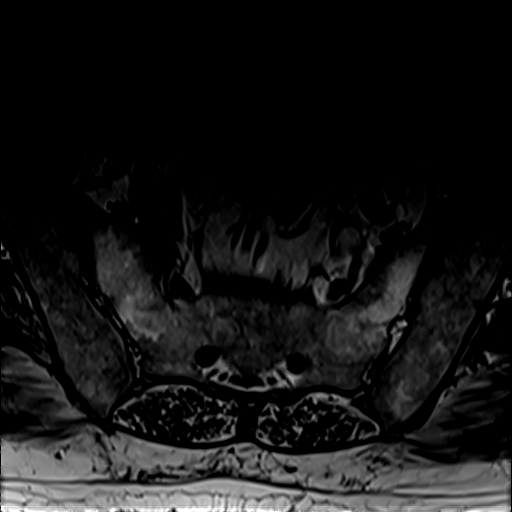
[im 7/41]
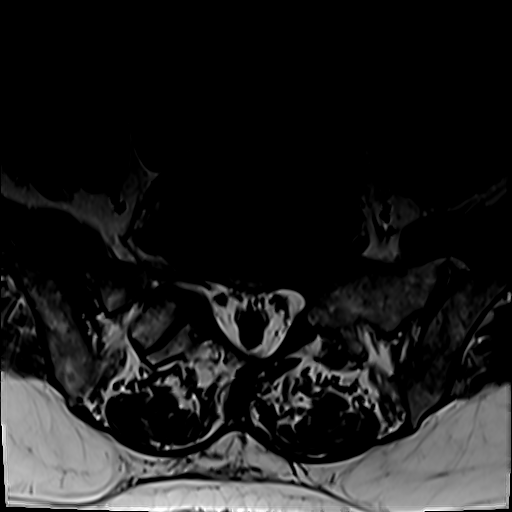
[im 13/41]
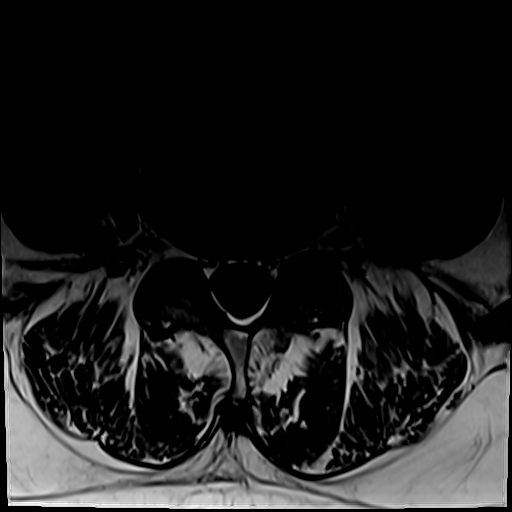
[im 19/41]
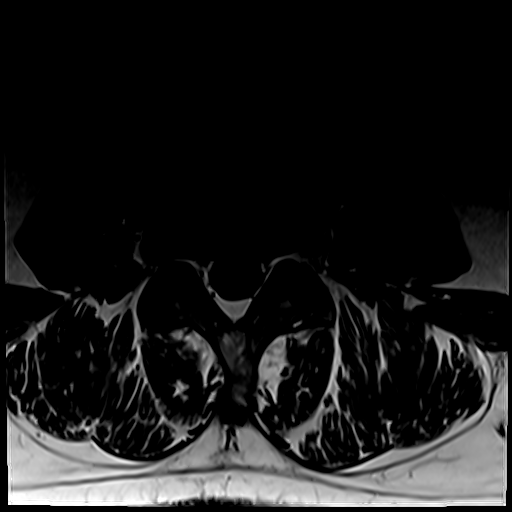
[im 22/41]
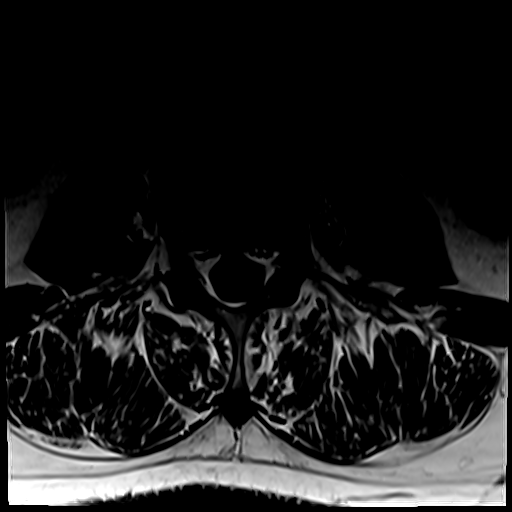
[im 28/41]
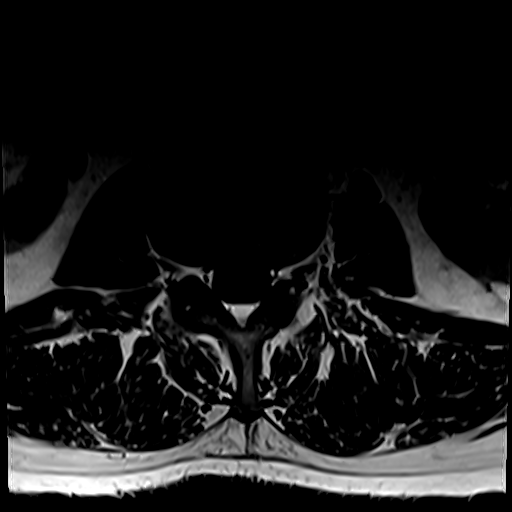
[im 34/41]
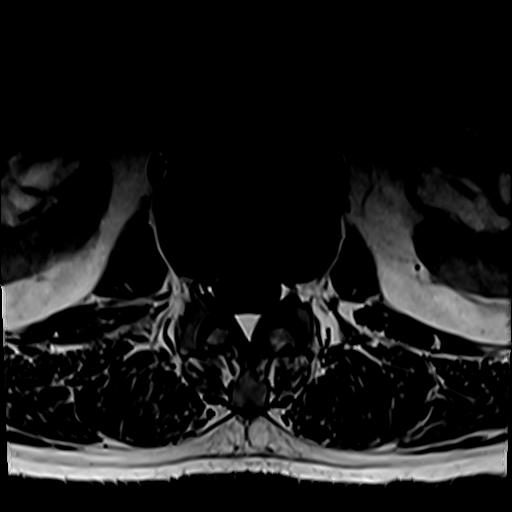
[im 41/41]
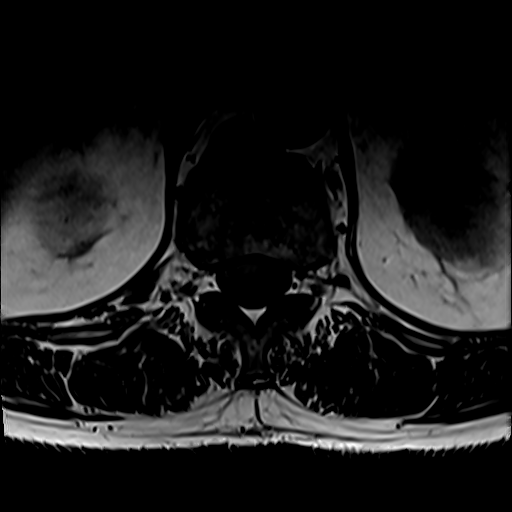

[31 of 48 positions shown; findings below may reference images not displayed]

FINDINGS: Segmentation:  Standard.

Alignment: Bilateral L5 pars defects with 5 mm anterolisthesis of L5
on S1.

Vertebrae: No vertebral body fracture, suspicious marrow lesion, or
significant marrow edema.

Conus medullaris and cauda equina: Conus extends to the L1 level.
Conus and cauda equina appear normal.

Paraspinal and other soft tissues: Unremarkable.

Disc levels:

T12-L1: Negative.

L1-2: Minimal disc bulging and minimal facet hypertrophy without
stenosis.

L2-3: Mild disc bulging and mild facet hypertrophy without stenosis.

L3-4: Mild disc desiccation. Disc bulging, a right foraminal to
extraforaminal disc osteophyte complex with associated annular
fissure, and mild facet hypertrophy result in mild right neural
foraminal stenosis without spinal stenosis.

L4-5: Mild disc bulging and mild facet hypertrophy result in
borderline to mild left neural foraminal stenosis without spinal
stenosis.

L5-S1: Disc desiccation and moderate disc space narrowing.
Anterolisthesis with bulging uncovered disc, endplate spurring, disc
space height loss, and hypertrophic changes at the chronic L5 pars
defects result in moderate bilateral neural foraminal stenosis
without spinal stenosis.
IMPRESSION: 1. Chronic L5 pars defects with grade 1 anterolisthesis and moderate
bilateral neural foraminal stenosis.
2. Mild spondylosis elsewhere without spinal stenosis.

## 2021-06-01 IMAGING — MR MR CERVICAL SPINE W/O CM
5 series · 39 of 48 positions shown · non-contrast
Comparison: Cervical spine radiographs [DATE]

CLINICAL DATA: Cervical radiculopathy. Numbness. Pain in the neck,
back, lower extremities, and left shoulder. Difficulty walking.

EXAM:
MRI CERVICAL SPINE WITHOUT CONTRAST
TECHNIQUE: Multiplanar, multisequence MR imaging of the cervical spine was
performed. No intravenous contrast was administered.

[Series 6: T1 · sagittal · 3.0mm · 0.90mm/px · 6 of 15 slices shown]
[im 1/15]
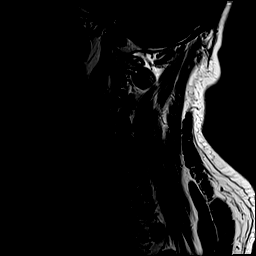
[im 3/15]
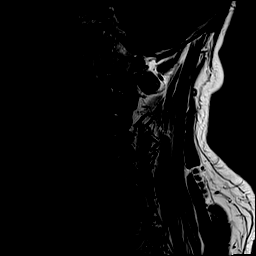
[im 6/15]
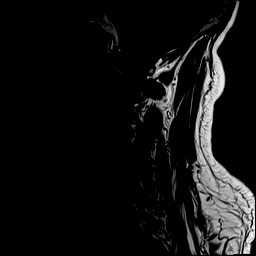
[im 9/15]
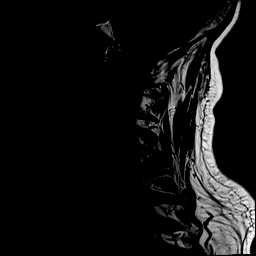
[im 12/15]
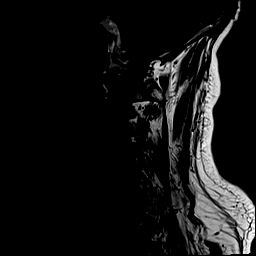
[im 15/15]
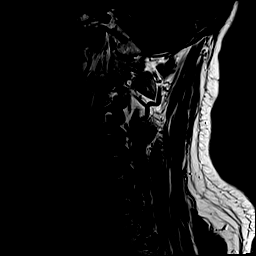

[Series 7: STIR · sagittal · 3.0mm · 0.72mm/px · 6 of 15 slices shown]
[im 1/15]
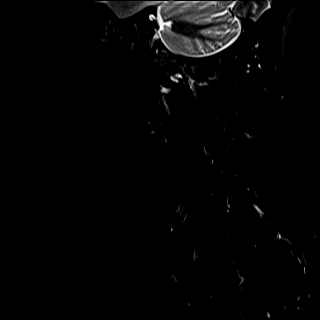
[im 3/15]
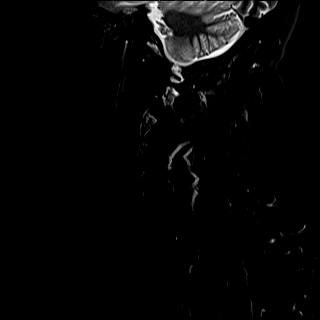
[im 6/15]
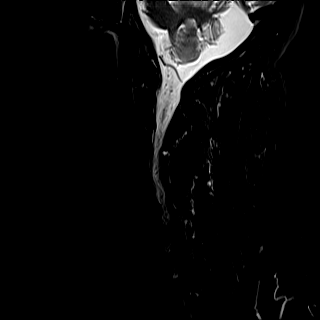
[im 9/15]
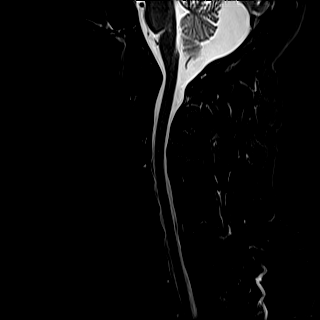
[im 12/15]
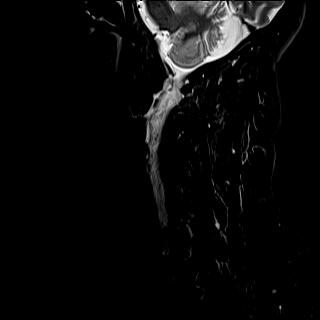
[im 15/15]
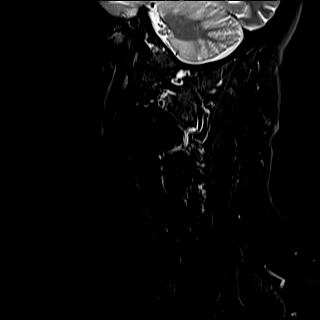

[Series 8: T2 · sagittal · 3.0mm · 0.72mm/px · 6 of 15 slices shown (1 of 2)]
[im 1/15]
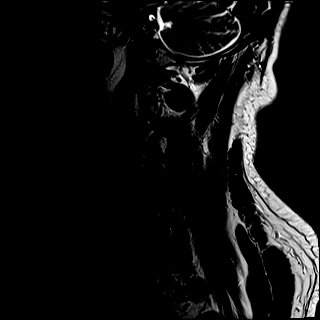
[im 3/15]
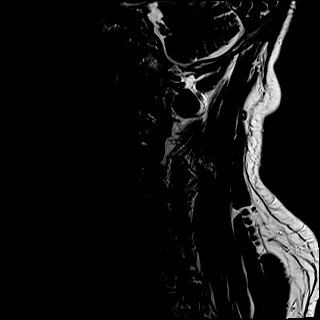
[im 6/15]
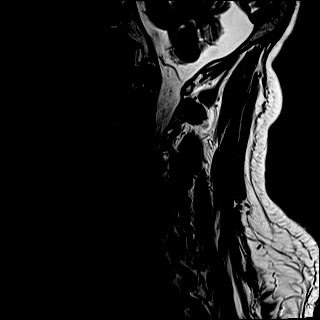
[im 9/15]
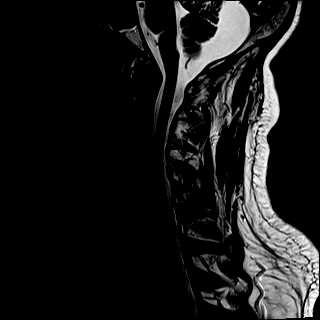
[im 12/15]
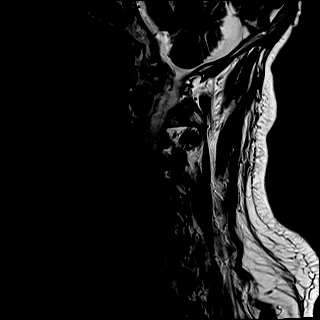
[im 15/15]
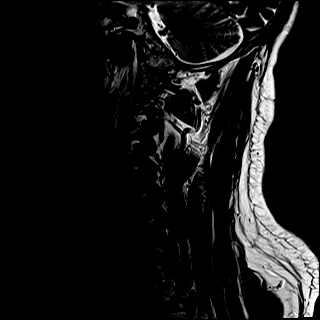

[Series 9: T2 · axial · 3.0mm · 0.70mm/px · z∈[-107,+16]mm · 13 of 40 slices shown (2 of 2)]
[im 1/40]
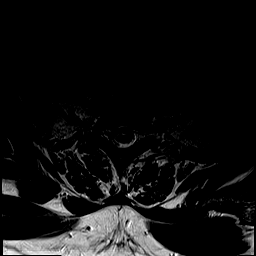
[im 3/40]
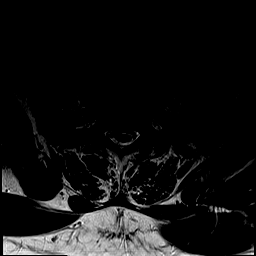
[im 6/40]
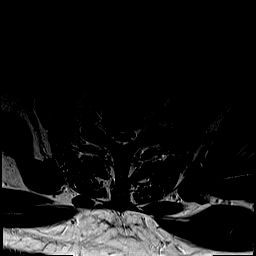
[im 9/40]
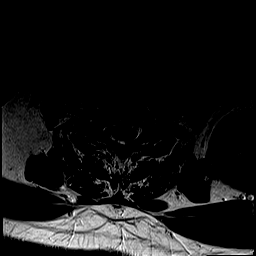
[im 12/40]
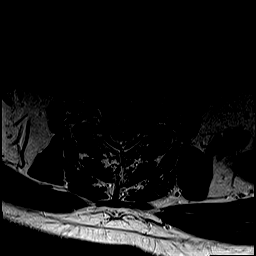
[im 14/40]
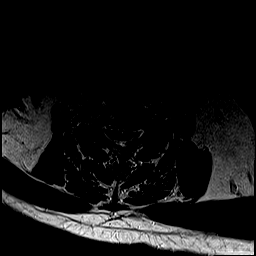
[im 17/40]
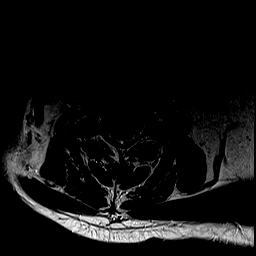
[im 20/40]
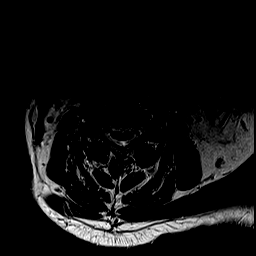
[im 23/40]
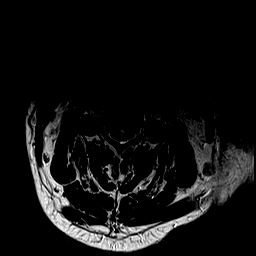
[im 26/40]
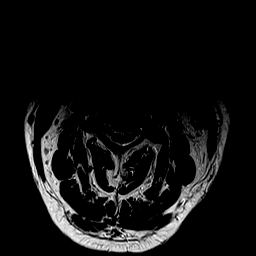
[im 28/40]
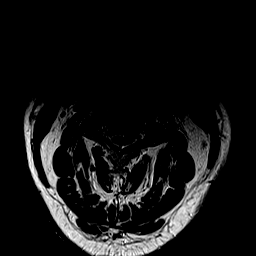
[im 34/40]
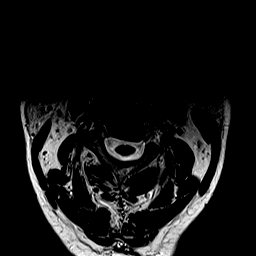
[im 40/40]
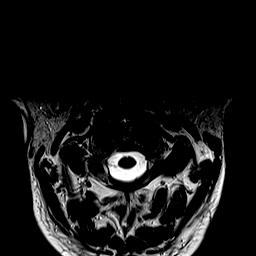

[Series 10: GRE · axial · 3.0mm · 0.35mm/px · z∈[-107,+16]mm · 8 of 40 slices shown]
[im 1/40]
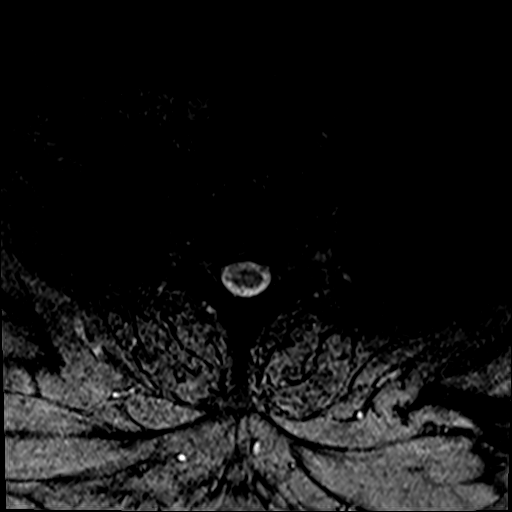
[im 6/40]
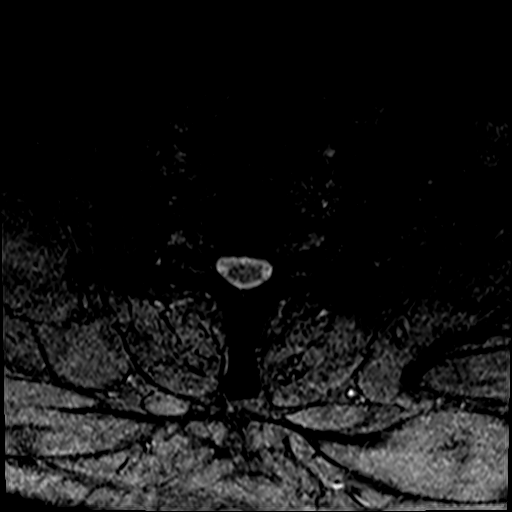
[im 12/40]
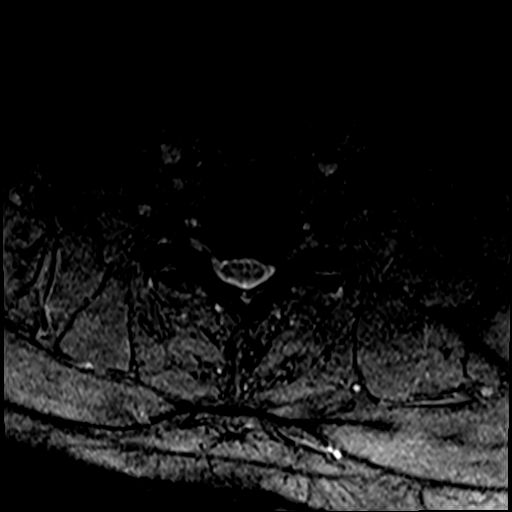
[im 17/40]
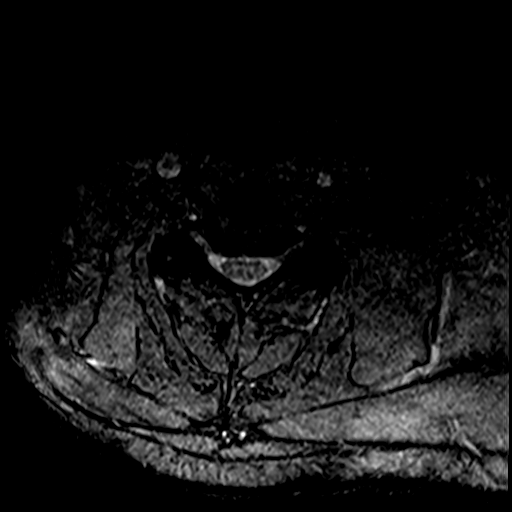
[im 23/40]
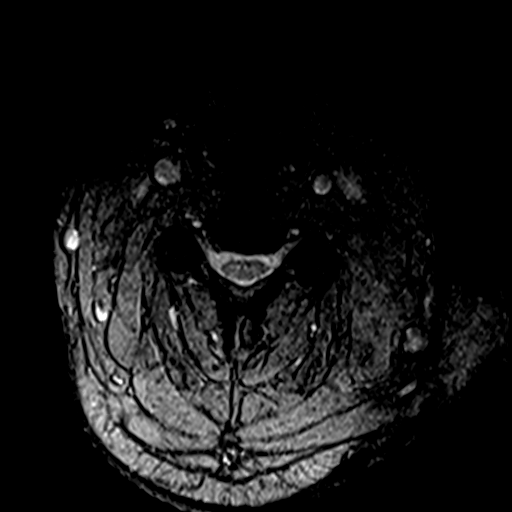
[im 28/40]
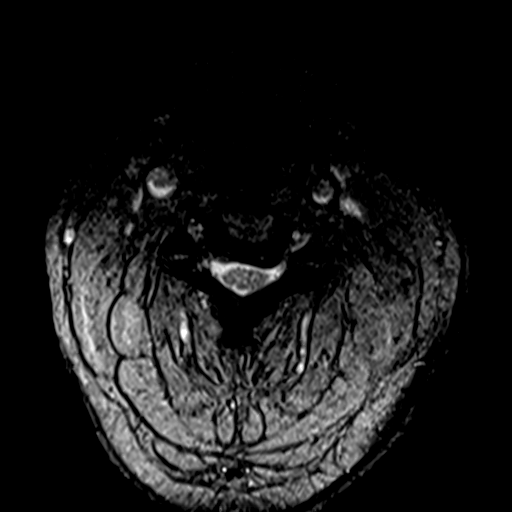
[im 34/40]
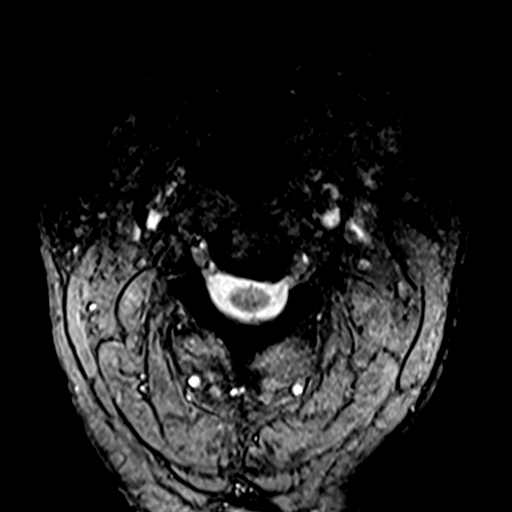
[im 40/40]
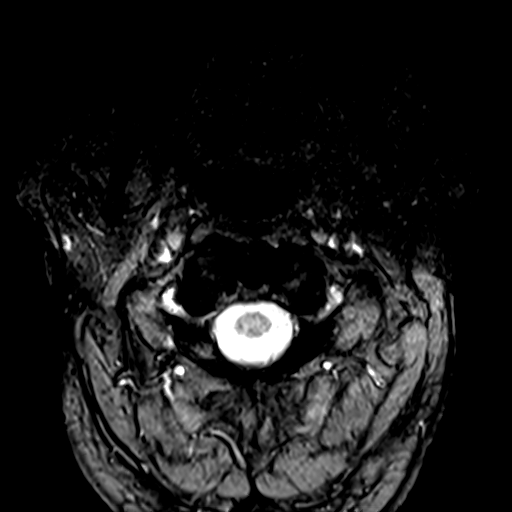

[39 of 48 positions shown; findings below may reference images not displayed]

FINDINGS: Alignment: Cervical spine straightening.  No significant listhesis.

Vertebrae: No fracture, suspicious marrow lesion, or significant
marrow edema. Prominent Modic type 2 endplate changes at C6-7.

Cord: Normal signal and morphology.

Posterior Fossa, vertebral arteries, paraspinal tissues: Prominent
retrocerebellar CSF space, likely a mega cisterna magna. Preserved
vertebral artery flow voids.

Disc levels:

C2-3: Asymmetric left uncovertebral spurring results in mild left
neural foraminal stenosis without spinal stenosis.

C3-4: Broad-based posterior disc osteophyte complex and mild facet
arthrosis result in mild spinal stenosis and mild right and moderate
left neural foraminal stenosis.

C4-5: Tiny central disc protrusion and minimal uncovertebral
spurring without stenosis.

C5-6: Disc bulging and uncovertebral spurring result in borderline
to mild spinal stenosis and mild bilateral neural foraminal
stenosis.

C6-7: Moderate disc space narrowing. Disc bulging and uncovertebral
spurring result in mild spinal stenosis and mild right and moderate
to severe left neural foraminal stenosis with potential left C7
nerve root impingement.

C7-T1: Minimal disc bulging and uncovertebral spurring result in
mild left neural foraminal stenosis without spinal stenosis.
IMPRESSION: 1. Multilevel cervical disc degeneration most notable at C6-7 where
there is mild spinal stenosis and moderate to severe left neural
foraminal stenosis.
2. Mild spinal stenosis and moderate left neural foraminal stenosis
at C3-4.

## 2021-06-01 NOTE — Telephone Encounter (Signed)
Patient said Timothy Nielsen was supposed to call him back yesterday and never did. He doesn't know what to do about this prescriptions

## 2021-06-01 NOTE — Telephone Encounter (Signed)
We will send patients b12 with syringes and needles to [facility]. Will call patient to inform when able to.

## 2021-06-01 NOTE — Telephone Encounter (Signed)
Called [facility] and confirmed that they have received patients B12, syringes, and needles. Patient may pick up and have his neighbor administer B12 injections as requested by patient.    Called patient and left a message per DPR on home voicemail that his b12 is ready for pick up at the [facility].

## 2021-06-06 ENCOUNTER — Other Ambulatory Visit: Payer: Self-pay

## 2021-06-06 DIAGNOSIS — R200 Anesthesia of skin: Secondary | ICD-10-CM

## 2021-06-06 DIAGNOSIS — M79602 Pain in left arm: Secondary | ICD-10-CM

## 2021-06-06 DIAGNOSIS — M5412 Radiculopathy, cervical region: Secondary | ICD-10-CM

## 2021-06-06 DIAGNOSIS — M48062 Spinal stenosis, lumbar region with neurogenic claudication: Secondary | ICD-10-CM

## 2021-06-12 ENCOUNTER — Other Ambulatory Visit: Payer: Self-pay

## 2021-06-12 ENCOUNTER — Ambulatory Visit (INDEPENDENT_AMBULATORY_CARE_PROVIDER_SITE_OTHER): Payer: Self-pay | Admitting: Neurology

## 2021-06-12 DIAGNOSIS — M5412 Radiculopathy, cervical region: Secondary | ICD-10-CM

## 2021-06-12 DIAGNOSIS — R200 Anesthesia of skin: Secondary | ICD-10-CM

## 2021-06-12 DIAGNOSIS — I739 Peripheral vascular disease, unspecified: Secondary | ICD-10-CM

## 2021-06-12 NOTE — Procedures (Signed)
[facility] Neurology  [address] [place], [address]  [place], [place] [address] Tel: [phone] Fax:  [phone] Test Date:  06/12/2021  Patient: Timothy Nielsen DOB: [birth year] Physician: Nita Sickle, DO  Sex: Male Height: 6\' 6"  Ref Phys: , DO  ID#: Nita Sickle   Technician:    Patient Complaints: This is a 50 year old man referred for evaluation of exertional leg paresthesias.  NCV & EMG Findings: Electrodiagnostic testing of the right lower extremity and additional studies of the left shows: Bilateral sural and superficial peroneal sensory responses are within normal limits. Bilateral peroneal and tibial motor responses are within normal limits. Bilateral tibial H reflex studies are within normal limits, when adjusted for patient's height. There is no evidence of active or chronic motor axonal changes affecting any of the tested muscles.  Motor unit configuration and recruitment pattern is within normal limits.  Impression: This is a normal study of the lower extremities.  In particular, there is no evidence of a sensorimotor polyneuropathy or lumbosacral radiculopathy.   ___________________________ 44, DO    Nerve Conduction Studies Anti Sensory Summary Table   Stim Site NR Peak (ms) Norm Peak (ms) P-T Amp (V) Norm P-T Amp  Left Sup Peroneal Anti Sensory (Ant Lat Mall)  32C  12 cm    3.2 <4.6 5.9 >4  Right Sup Peroneal Anti Sensory (Ant Lat Mall)  32C  12 cm    3.0 <4.6 6.9 >4  Left Sural Anti Sensory (Lat Mall)  32C  Calf    3.8 <4.6 9.7 >4  Right Sural Anti Sensory (Lat Mall)  32C  Calf    3.9 <4.6 8.5 >4   Motor Summary Table   Stim Site NR Onset (ms) Norm Onset (ms) O-P Amp (mV) Norm O-P Amp Site1 Site2 Delta-0 (ms) Dist (cm) Vel (m/s) Norm Vel (m/s)  Left Peroneal Motor (Ext Dig Brev)  32C  Ankle    5.9 <6.0 5.0 >2.5 B Fib Ankle 9.6 42.0 44 >40  B Fib    15.5  4.6  Poplt B Fib 1.8 10.0 56 >40  Poplt    17.3  4.2         Right  Peroneal Motor (Ext Dig Brev)  32C  Ankle    4.9 <6.0 4.3 >2.5 B Fib Ankle 9.6 42.0 44 >40  B Fib    14.5  4.0  Poplt B Fib 2.1 10.0 48 >40  Poplt    16.6  3.8         Left Tibial Motor (Abd Hall Brev)  32C  Ankle    4.1 <6.0 7.7 >4 Knee Ankle 11.3 48.0 42 >40  Knee    15.4  5.3         Right Tibial Motor (Abd Hall Brev)  32C  Ankle    3.7 <6.0 7.3 >4 Knee Ankle 11.8 48.0 41 >40  Knee    15.5  3.4          H Reflex Studies   NR H-Lat (ms) Lat Norm (ms) L-R H-Lat (ms)  Left Tibial (Gastroc)  32C     38.64 <35 1.63  Right Tibial (Gastroc)  32C     40.27 <35 1.63   EMG   Side Muscle Ins Act Fibs Psw Fasc Number Recrt Dur Dur. Amp Amp. Poly Poly. Comment  Right AntTibialis Nml Nml Nml Nml Nml Nml Nml Nml Nml Nml Nml Nml N/A  Right Gastroc Nml Nml Nml Nml Nml Nml Nml Nml  Nml Nml Nml Nml N/A  Right Flex Dig Long Nml Nml Nml Nml Nml Nml Nml Nml Nml Nml Nml Nml N/A  Right RectFemoris Nml Nml Nml Nml Nml Nml Nml Nml Nml Nml Nml Nml N/A  Right GluteusMed Nml Nml Nml Nml Nml Nml Nml Nml Nml Nml Nml Nml N/A  Left AntTibialis Nml Nml Nml Nml Nml Nml Nml Nml Nml Nml Nml Nml N/A  Left Gastroc Nml Nml Nml Nml Nml Nml Nml Nml Nml Nml Nml Nml N/A  Left Flex Dig Long Nml Nml Nml Nml Nml Nml Nml Nml Nml Nml Nml Nml N/A  Left RectFemoris Nml Nml Nml Nml Nml Nml Nml Nml Nml Nml Nml Nml N/A  Left GluteusMed Nml Nml Nml Nml Nml Nml Nml Nml Nml Nml Nml Nml N/A      Waveforms:

## 2021-06-12 NOTE — Progress Notes (Signed)
Follow-up Visit   Date: 06/12/21   Jasir Rother MRN: [id number] DOB: November 26, 1970   Interim History: Iker Nuttall is a 50 y.o. right-handed Caucasian male with COPD, hypertension, hyperlipidemia, GERD, and tobacco use returning to the clinic for follow-up of bilateral feet pain and left arm pain.  The patient was accompanied to the clinic by self.  History of present illness: For the past year, he has soreness at the soles of the feet which is worse in the morning and with prolonged standing and walking. When severe, he will also involve the calf, especially on the right leg.  If he rests, symptoms do get a little better.  He has some numbness involving the toes, which is constant.  No tingling.  He denies weakness.  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  He has applied for disability due to his feet pain.  He has not had ABI.   He walks with a cane since childhood because of spinal infection. He has imbalance which is worse of uneven ground.  He rarely falls.     He has neck pain for the past 3 years and started having low back pain this year.  He was seeing a chiropractor for his neck.  He was having shooting pain and numbness into his left arm with lifting. This has improved since he is no longer lifting. He does not have any prior imaging of the neck.    He is trying to quit smoking down to 4 cigarettes from a 1 PPD.   He drinks 12-pack of beer per week.    UPDATE 06/12/2021:  He is here for EDX of the legs and review results of MRI.  MRI cervical spine shows Multilevel disc degeneration worst at C6-7 where there is moderate to severe left neuroforaminal stenosis.  MRI lumbar spine did not show any evidence of nerve impingement or cord disease.  He also complains of intermittent shortness of breath and chest discomfort.  He has not seen his PCP for this.  Medications:  Current Outpatient Medications on File Prior to Visit   Medication Sig Dispense Refill   albuterol (VENTOLIN HFA) 108 (90 Base) MCG/ACT inhaler Inhale 2 puffs into the lungs every 6 (six) hours as needed for wheezing or shortness of breath. 3 each 0   atorvastatin (LIPITOR) 20 MG tablet Take 1 tablet (20 mg total) by mouth daily. 90 tablet 1   cyanocobalamin (,VITAMIN B-12,) 1000 MCG/ML injection Inject 1 ml intramuscularly daily for 7 days, then inject 1 ml weekly for 4 weeks, then 1 ml monthly thereafter for 1 year. 10 mL 2   fluticasone furoate-vilanterol (BREO ELLIPTA) 100-25 MCG/ACT AEPB Inhale 1 puff into the lungs daily. 3 each 3   lisinopril-hydrochlorothiazide (ZESTORETIC) 20-12.5 MG tablet Take 1 tablet by mouth daily. 90 tablet 0   NEEDLE, DISP, 25 G 25G X 1-1/2" MISC Use as directed 10 each 1   omeprazole (PRILOSEC) 40 MG capsule Take 1 capsule (40 mg total) by mouth daily. 90 capsule 0   simvastatin (ZOCOR) 20 MG tablet Take 1 tablet (20 mg total) by mouth at bedtime. 90 tablet 3   SYRINGE-NEEDLE, DISP, 3 ML 21G X 1" 3 ML MISC Use as directed 10 each 1   Current Facility-Administered Medications on File Prior to Visit  Medication Dose Route Frequency Provider Last Rate Last Admin   methylPREDNISolone acetate (DEPO-MEDROL) injection 40 mg  40 mg  Intramuscular Once Dettinger, Elige Radon, MD        Allergies: No Known Allergies  Vital Signs:  There were no vitals taken for this visit.   Exam deferred  Data: NCS/EMG of the legs 04/12/2021: This is a normal study of the lower extremities.  In particular, there is no evidence of a sensorimotor polyneuropathy or lumbosacral radiculopathy.  MRI cervical spine 06/01/2021:   1. Multilevel cervical disc degeneration most notable at C6-7 where there is mild spinal stenosis and moderate to severe left neural foraminal stenosis. 2. Mild spinal stenosis and moderate left neural foraminal stenosis at C3-4.  MRI lumbar spine wo contrast 06/01/2021: 1. Chronic L5 pars defects with grade 1  anterolisthesis and moderate bilateral neural foraminal stenosis. 2. Mild spondylosis elsewhere without spinal stenosis.  IMPRESSION/PLAN: Exertional leg pain.  No evidence of neurogenic claudication or neuropathy - Check ABI to screen for vascular disease  2.   Left cervical radiculopathy with neural foraminal narrowing at C6-7, clinically improved  - If symptoms get worse, consider referral to PM&R for injections  3.  Shortness of breath and chest discomfort  - Follow-up with PCP, may need stress test given his vascular risk factors  Thank you for allowing me to participate in patient's care.  If I can answer any additional questions, I would be pleased to do so.    Sincerely,    Masa Lubin K. Allena Katz, DO

## 2021-06-19 ENCOUNTER — Ambulatory Visit: Payer: Self-pay | Attending: Neurology | Admitting: Physical Therapy

## 2021-06-19 ENCOUNTER — Other Ambulatory Visit: Payer: Self-pay

## 2021-06-19 DIAGNOSIS — M79602 Pain in left arm: Secondary | ICD-10-CM | POA: Insufficient documentation

## 2021-06-19 DIAGNOSIS — M542 Cervicalgia: Secondary | ICD-10-CM | POA: Insufficient documentation

## 2021-06-19 DIAGNOSIS — R293 Abnormal posture: Secondary | ICD-10-CM | POA: Insufficient documentation

## 2021-06-19 DIAGNOSIS — M5412 Radiculopathy, cervical region: Secondary | ICD-10-CM | POA: Insufficient documentation

## 2021-06-19 NOTE — Therapy (Signed)
[facility] [address] [place], [place], [address] Phone: [phone]   Fax:  [phone]  Physical Therapy Evaluation  Patient Details  Name: Timothy Nielsen MRN: [id number] Date of Birth: [birth year] Referring Provider (PT): Nita Sickle MD   Encounter Date: 06/19/2021   PT End of Session - 06/19/21 1245     Visit Number 1    Number of Visits 12    Date for PT Re-Evaluation 07/31/21    PT Start Time 1116    PT Stop Time 1155    PT Time Calculation (min) 39 min    Activity Tolerance Patient tolerated treatment well    Behavior During Therapy [facility] for tasks assessed/performed             Past Medical History:  Diagnosis Date   Asthma    Hypertension     Past Surgical History:  Procedure Laterality Date   APPENDECTOMY     FRACTURE SURGERY Right    right knee- age 50    There were no vitals filed for this visit.    Subjective Assessment - 06/19/21 1246     Subjective COVID-19 screen performed prior to patient entering clinic.  The patient presents to the clinic today with c/o left-sided neck pain, occasional headaches and pain into his left shoulder and tingling into his left hand and fingers.  This has been an ongoing and worseneing problems for about two years.  He has had Chiropratic care in the past.  His pain is rated at an 8/10 today.  Lying down decreases his pain and looking down increases his pain.  Lifting objects with his left hand will also spike his pain and cause his neck muscle to spasm.    Pertinent History H/o balance problems going back to childhood (he uses a cane for safety), LE numbness, right knee fracture and surgery, HTN.    How long can you sit comfortably? Less than 5 minutes.    Patient Stated Goals Get out of pain and get rid of symptoms in left UE.    Currently in Pain? Yes    Pain Score 8     Pain Location Neck    Pain Orientation Left    Pain Descriptors / Indicators Aching;Sore    Pain  Type Chronic pain    Pain Onset More than a month ago    Pain Frequency Constant    Aggravating Factors  See above.    Pain Relieving Factors See above.                [facility] PT Assessment - 06/19/21 0001       Assessment   Medical Diagnosis Cervical radiculopathy, left arm pain.    Referring Provider (PT) Nita Sickle MD    Onset Date/Surgical Date --   ~2 years.     Precautions   Precautions Fall    Precaution Comments Patient uses a cane for safety.      Restrictions   Weight Bearing Restrictions No      Balance Screen   Has the patient fallen in the past 6 months Yes    How many times? 1.    Has the patient had a decrease in activity level because of a fear of falling?  No    Is the patient reluctant to leave their home because of a fear of falling?  No      Home [employer] information [employer] residence      Prior  Function   Level of Independence Independent      Posture/Postural Control   Posture/Postural Control Postural limitations    Postural Limitations Rounded Shoulders;Forward head      Deep Tendon Reflexes   DTR Assessment Site --   Diminished bilateral UE DTR's.     ROM / Strength   AROM / PROM / Strength AROM;Strength      AROM   Overall AROM Comments Right active cervical rotation is 75 degrees and left is 65 degrees      Strength   Overall Strength Comments Essentiall normal left UE strength.  Left grip is 80# compared to his right dominant side which is 105#.      Palpation   Palpation comment Mild pain complaints with aome increased tone in left mid to lower cervical region.      Special Tests   Other special tests Cervical distraction test caused no increase in pain.      Ambulation/Gait   Gait Comments The patient was walking with a straight cane.                        Objective measurements completed on examination: See above findings.       [facility] Adult PT Treatment/Exercise - 06/19/21 0001        Modalities   Modalities Electrical Stimulation;Moist Heat      Moist Heat Therapy   Number Minutes Moist Heat 15 Minutes    Moist Heat Location --   Left cervical.     Electrical Stimulation   Electrical Stimulation Location Left cervical/UT    Electrical Stimulation Action IFC at 80-150 Hz.    Electrical Stimulation Parameters 40% scan x 15 minutes.    Electrical Stimulation Goals Pain;Tone                          PT Long Term Goals - 06/19/21 1340       PT LONG TERM GOAL #1   Title Independent with an HEP.    Time 6    Period Weeks    Status New      PT LONG TERM GOAL #2   Title Left active cervical rotation to 75 degrees.    Time 6    Period Weeks    Status New      PT LONG TERM GOAL #3   Title Left grip increased by 10#.    Time 6    Period Weeks    Status New      PT LONG TERM GOAL #4   Title Perform ADL's with pain not > 3/10.    Time 6    Period Weeks    Status New                    Plan - 06/19/21 1258     Clinical Impression Statement The patient presents to O[facility] with chronic neck pain and left sided radiculopathy which includes pain into his left shoulder and tingling into his left hand and fingers.  He has some limitation to active cervical motion.  He states his neck locks up within 5 minutes of looking down and carrying/lifting objects with his left hand causes his neck muscles to go into spasm.  His left UE DTR's are diminished.  His left UE strength is essentially normal though his left grip is considerably weaker than the right dominant side. Patient will benefit from skilled physical  therapy intervention to address pain and deficits.    Personal Factors and Comorbidities Other    Examination-Activity Limitations Sit;Lift    Examination-Participation Restrictions Other    Stability/Clinical Decision Making Evolving/Moderate complexity    Clinical Decision Making Low    Rehab Potential Good    PT Frequency 2x / week     PT Duration 6 weeks    PT Treatment/Interventions ADLs/Self Care Home Management;Cryotherapy;Electrical Stimulation;Ultrasound;Traction;Moist Heat;Therapeutic activities;Therapeutic exercise;Manual techniques;Patient/family education;Passive range of motion    PT Next Visit Plan Chin tucks and cervical extension, postural exercises, Combo e'stim/US, STW/M, may try intermittment cervical traction beginning at 12#.    Consulted and Agree with Plan of Care Patient             Patient will benefit from skilled therapeutic intervention in order to improve the following deficits and impairments:  Pain, Decreased activity tolerance, Decreased range of motion, Postural dysfunction, Decreased strength  Visit Diagnosis: Cervicalgia - Plan: PT plan of care cert/re-cert  Abnormal posture - Plan: PT plan of care cert/re-cert     Problem List Patient Active Problem List   Diagnosis Date Noted   Current smoker 11/21/2016   GERD (gastroesophageal reflux disease) 11/21/2016   COPD (chronic obstructive pulmonary disease) (HCC) 11/21/2016   Aortic atherosclerosis (HCC) 11/21/2016   Essential hypertension 09/29/2014    [place] Churchill, [place], PT 06/19/2021, 2:03 PM  [facility] [address] [place], [place], [address] Phone: [phone]   Fax:  [phone]  Name: Joshue Badal MRN: [id number] Date of Birth: 01-18-1971

## 2021-06-21 ENCOUNTER — Other Ambulatory Visit: Payer: Self-pay

## 2021-06-21 ENCOUNTER — Ambulatory Visit: Payer: Self-pay

## 2021-06-21 DIAGNOSIS — M542 Cervicalgia: Secondary | ICD-10-CM

## 2021-06-21 DIAGNOSIS — R293 Abnormal posture: Secondary | ICD-10-CM

## 2021-06-21 NOTE — Therapy (Signed)
[facility] [address] [place], [place], [address] Phone: [phone]   Fax:  [phone]  Physical Therapy Treatment  Patient Details  Name: Timothy Nielsen MRN: [id number] Date of Birth: 1971-05-24 Referring Provider (PT): Nita Sickle MD   Encounter Date: 06/21/2021   PT End of Session - 06/21/21 1112     Visit Number 2    Number of Visits 12    Date for PT Re-Evaluation 07/31/21    PT Start Time 1115    PT Stop Time 1205    PT Time Calculation (min) 50 min    Activity Tolerance Patient tolerated treatment well    Behavior During Therapy [facility] for tasks assessed/performed             Past Medical History:  Diagnosis Date   Asthma    Hypertension     Past Surgical History:  Procedure Laterality Date   APPENDECTOMY     FRACTURE SURGERY Right    right knee- age 59    There were no vitals filed for this visit.   Subjective Assessment - 06/21/21 1112     Subjective Covid-19 screen performed prior to patient entering clinic.  Pt arrives for today's treatment session reporting 2/10 left sided neck pain.    Pertinent History H/o balance problems going back to childhood (he uses a cane for safety), LE numbness, right knee fracture and surgery, HTN.    How long can you sit comfortably? Less than 5 minutes.    Patient Stated Goals Get out of pain and get rid of symptoms in left UE.    Currently in Pain? Yes    Pain Score 2     Pain Location Neck    Pain Orientation Left    Pain Onset More than a month ago                               [facility] Adult PT Treatment/Exercise - 06/21/21 0001       Exercises   Exercises Neck      Neck Exercises: Machines for Strengthening   Nustep Lvl 3 x 12 mins      Neck Exercises: Seated   Neck Retraction Other (comment)   discontinued due to increased pain 5/10   Lateral Flexion Both;15 reps    Other Seated Exercise Cervical extension x 15 reps   cues for pain free  ROM     Modalities   Modalities Electrical Stimulation;Moist Heat      Moist Heat Therapy   Number Minutes Moist Heat 15 Minutes    Moist Heat Location Cervical      Electrical Stimulation   Electrical Stimulation Location Left cervical/UT    Electrical Stimulation Action IFC at 80-150 Hz    Electrical Stimulation Parameters 40% scan x 15 mins    Electrical Stimulation Goals Pain;Tone      Manual Therapy   Manual Therapy Soft tissue mobilization    Soft tissue mobilization STW/M to left upper trap with trigger point release                          PT Long Term Goals - 06/19/21 1340       PT LONG TERM GOAL #1   Title Independent with an HEP.    Time 6    Period Weeks    Status New      PT LONG  TERM GOAL #2   Title Left active cervical rotation to 75 degrees.    Time 6    Period Weeks    Status New      PT LONG TERM GOAL #3   Title Left grip increased by 10#.    Time 6    Period Weeks    Status New      PT LONG TERM GOAL #4   Title Perform ADL's with pain not > 3/10.    Time 6    Period Weeks    Status New                   Plan - 06/21/21 1112     Clinical Impression Statement Pt arrives for today's treatment session reporting 2/10 left neck pain with left hand/fingers tingling intermittently throughout the day.  Pt able to tolerate Nustep at level 3 as warmup without issue or complaint of increased pain.  Pt instructed in seated neck exercises with chin tucks illiciting increased pain of 5/10 so discontinued.  STM/W performed to left upper trap with trigger point release with good results.  Normal responses to estime and moist heat noted.  Pt reported 1/10 left neck/shoulder pain at completion of today's treatment session.    Personal Factors and Comorbidities Other    Examination-Activity Limitations Sit;Lift    Examination-Participation Restrictions Other    Stability/Clinical Decision Making Evolving/Moderate complexity    Rehab  Potential Good    PT Frequency 2x / week    PT Duration 6 weeks    PT Treatment/Interventions ADLs/Self Care Home Management;Cryotherapy;Electrical Stimulation;Ultrasound;Traction;Moist Heat;Therapeutic activities;Therapeutic exercise;Manual techniques;Patient/family education;Passive range of motion    PT Next Visit Plan Chin tucks and cervical extension, postural exercises, Combo e'stim/US, STW/M, may try intermittment cervical traction beginning at 12#.    Consulted and Agree with Plan of Care Patient             Patient will benefit from skilled therapeutic intervention in order to improve the following deficits and impairments:  Pain, Decreased activity tolerance, Decreased range of motion, Postural dysfunction, Decreased strength  Visit Diagnosis: Cervicalgia  Abnormal posture     Problem List Patient Active Problem List   Diagnosis Date Noted   Current smoker 11/21/2016   GERD (gastroesophageal reflux disease) 11/21/2016   COPD (chronic obstructive pulmonary disease) (HCC) 11/21/2016   Aortic atherosclerosis (HCC) 11/21/2016   Essential hypertension 09/29/2014    Newman Pies, PTA 06/21/2021, 12:09 PM  [facility] [address] [place], [place], [address] Phone: [phone]   Fax:  [phone]  Name: Timothy Nielsen MRN: [id number] Date of Birth: 1970-06-28

## 2021-06-28 ENCOUNTER — Ambulatory Visit: Payer: Self-pay

## 2021-06-28 ENCOUNTER — Other Ambulatory Visit: Payer: Self-pay | Admitting: Physician Assistant

## 2021-06-28 DIAGNOSIS — E785 Hyperlipidemia, unspecified: Secondary | ICD-10-CM

## 2021-06-28 DIAGNOSIS — Z131 Encounter for screening for diabetes mellitus: Secondary | ICD-10-CM

## 2021-06-28 DIAGNOSIS — I10 Essential (primary) hypertension: Secondary | ICD-10-CM

## 2021-06-28 DIAGNOSIS — R7989 Other specified abnormal findings of blood chemistry: Secondary | ICD-10-CM

## 2021-06-28 DIAGNOSIS — R7309 Other abnormal glucose: Secondary | ICD-10-CM

## 2021-07-03 ENCOUNTER — Ambulatory Visit: Payer: Self-pay | Attending: Neurology

## 2021-07-03 ENCOUNTER — Other Ambulatory Visit: Payer: Self-pay

## 2021-07-03 DIAGNOSIS — R293 Abnormal posture: Secondary | ICD-10-CM | POA: Insufficient documentation

## 2021-07-03 DIAGNOSIS — M542 Cervicalgia: Secondary | ICD-10-CM | POA: Insufficient documentation

## 2021-07-03 NOTE — Therapy (Signed)
[facility] [address] [place], [place], [address] Phone: [phone]   Fax:  [phone]  Physical Therapy Treatment  Patient Details  Name: Timothy Nielsen MRN: [id number] Date of Birth: 01/18/1971 Referring Provider (PT): Nita Sickle MD   Encounter Date: 07/03/2021   PT End of Session - 07/03/21 1259     Visit Number 3    Number of Visits 12    Date for PT Re-Evaluation 07/31/21    PT Start Time 1300    PT Stop Time 1403    PT Time Calculation (min) 63 min    Activity Tolerance Patient tolerated treatment well    Behavior During Therapy [facility] for tasks assessed/performed             Past Medical History:  Diagnosis Date   Asthma    Hypertension     Past Surgical History:  Procedure Laterality Date   APPENDECTOMY     FRACTURE SURGERY Right    right knee- age 80    There were no vitals filed for this visit.   Subjective Assessment - 07/03/21 1259     Subjective Covid-19 screen performed prior to patient entering clinic. Patient reports that his pain is worse today than it has been in a while. He notes that he has needed to drive to [facility] multiple times in the past week and use his left arm. He notes that it has been worse in the past week and he has numbness in his left hand.    Pertinent History H/o balance problems going back to childhood (he uses a cane for safety), LE numbness, right knee fracture and surgery, HTN.    How long can you sit comfortably? Less than 5 minutes.    Patient Stated Goals Get out of pain and get rid of symptoms in left UE.    Currently in Pain? Yes    Pain Score 7     Pain Location Neck    Pain Orientation Left    Pain Type Chronic pain    Pain Onset More than a month ago                               [facility] Adult PT Treatment/Exercise - 07/03/21 0001       Neck Exercises: Machines for Strengthening   Nustep L3 x 9 minutes   symptoms began increasing around  9 minutes     Neck Exercises: Seated   Other Seated Exercise Self distraction   short holds; 1 minute     Modalities   Modalities Electrical Stimulation;Moist Heat      Moist Heat Therapy   Number Minutes Moist Heat 15 Minutes    Moist Heat Location Cervical      Electrical Stimulation   Electrical Stimulation Location Left cervical/UT    Electrical Stimulation Action Pre-mod    Electrical Stimulation Parameters 80-150 Hz x 20 minutes    Electrical Stimulation Goals Pain;Tone      Manual Therapy   Manual Therapy Soft tissue mobilization;Joint mobilization;Manual Traction    Joint Mobilization Side glides to the left    Soft tissue mobilization left upper trapezius, suboccipitals, and cervical paraspinals    Manual Traction cervical                          PT Long Term Goals - 06/19/21 1340  PT LONG TERM GOAL #1   Title Independent with an HEP.    Time 6    Period Weeks    Status New      PT LONG TERM GOAL #2   Title Left active cervical rotation to 75 degrees.    Time 6    Period Weeks    Status New      PT LONG TERM GOAL #3   Title Left grip increased by 10#.    Time 6    Period Weeks    Status New      PT LONG TERM GOAL #4   Title Perform ADL's with pain not > 3/10.    Time 6    Period Weeks    Status New                   Plan - 07/03/21 1259     Clinical Impression Statement Patient presented to treatment reporting a significant increase in his familiar symptoms since his last appointment. Treatment focused on manual therapy for symptom reduction with cervical distraction being the most effective. He experienced a mild increase in his familiar symptoms with the nustep. However, manual therapy was able to reduce this irritability. He reported that his neck felt a little better upon the conclusion of treatment. He continues to require skilled physical therapy to address his remainig impairments to return to his prior level of  function.    Personal Factors and Comorbidities Other    Examination-Activity Limitations Sit;Lift    Examination-Participation Restrictions Other    Stability/Clinical Decision Making Evolving/Moderate complexity    Rehab Potential Good    PT Frequency 2x / week    PT Duration 6 weeks    PT Treatment/Interventions ADLs/Self Care Home Management;Cryotherapy;Electrical Stimulation;Ultrasound;Traction;Moist Heat;Therapeutic activities;Therapeutic exercise;Manual techniques;Patient/family education;Passive range of motion    PT Next Visit Plan Chin tucks and cervical extension, postural exercises, Combo e'stim/US, STW/M, may try intermittment cervical traction beginning at 12#.    Consulted and Agree with Plan of Care Patient             Patient will benefit from skilled therapeutic intervention in order to improve the following deficits and impairments:  Pain, Decreased activity tolerance, Decreased range of motion, Postural dysfunction, Decreased strength  Visit Diagnosis: Cervicalgia  Abnormal posture     Problem List Patient Active Problem List   Diagnosis Date Noted   Current smoker 11/21/2016   GERD (gastroesophageal reflux disease) 11/21/2016   COPD (chronic obstructive pulmonary disease) (HCC) 11/21/2016   Aortic atherosclerosis (HCC) 11/21/2016   Essential hypertension 09/29/2014    Granville Lewis, PT 07/03/2021, 3:02 PM  [facility] [address] [place], [place], [address] Phone: [phone]   Fax:  [phone]  Name: Timothy Nielsen MRN: [id number] Date of Birth: 1970/07/21

## 2021-07-05 ENCOUNTER — Other Ambulatory Visit: Payer: Self-pay

## 2021-07-05 ENCOUNTER — Ambulatory Visit: Payer: Self-pay | Admitting: *Deleted

## 2021-07-05 DIAGNOSIS — M542 Cervicalgia: Secondary | ICD-10-CM

## 2021-07-05 DIAGNOSIS — R293 Abnormal posture: Secondary | ICD-10-CM

## 2021-07-05 NOTE — Therapy (Signed)
[facility] [address] [place], [place], [address] Phone: [phone]   Fax:  [phone]  Physical Therapy Treatment  Patient Details  Name: Timothy Nielsen MRN: [id number] Date of Birth: 23-Oct-1970 Referring Provider (PT): Nita Sickle MD   Encounter Date: 07/05/2021   PT End of Session - 07/05/21 1353     Visit Number 4    Number of Visits 12    Date for PT Re-Evaluation 07/31/21    PT Start Time 1300    PT Stop Time 1350    PT Time Calculation (min) 50 min             Past Medical History:  Diagnosis Date   Asthma    Hypertension     Past Surgical History:  Procedure Laterality Date   APPENDECTOMY     FRACTURE SURGERY Right    right knee- age 75    There were no vitals filed for this visit.   Subjective Assessment - 07/05/21 1303     Subjective Not as bad today. 4/10 and symptoms LT hand    Pertinent History H/o balance problems going back to childhood (he uses a cane for safety), LE numbness, right knee fracture and surgery, HTN.                               [facility] Adult PT Treatment/Exercise - 07/05/21 0001       Exercises   Exercises Neck      Modalities   Modalities Electrical Stimulation;Moist Heat;Ultrasound;Traction      Moist Heat Therapy   Number Minutes Moist Heat 15 Minutes    Moist Heat Location Cervical      Electrical Stimulation   Electrical Stimulation Location Left cervical/UT    Electrical Stimulation Action IFC    Electrical Stimulation Parameters 80-150hz  x 15 mins    Electrical Stimulation Goals Pain;Tone      Ultrasound   Ultrasound Location LT cerv paras    Ultrasound Parameters combo 1.5 w/cm2 x 10 mins    Ultrasound Goals Pain      Traction   Type of Traction Cervical    Min (lbs) 5    Max (lbs) 12    Hold Time 99    Rest Time 5    Time 15                          PT Long Term Goals - 06/19/21 1340       PT LONG TERM GOAL #1    Title Independent with an HEP.    Time 6    Period Weeks    Status New      PT LONG TERM GOAL #2   Title Left active cervical rotation to 75 degrees.    Time 6    Period Weeks    Status New      PT LONG TERM GOAL #3   Title Left grip increased by 10#.    Time 6    Period Weeks    Status New      PT LONG TERM GOAL #4   Title Perform ADL's with pain not > 3/10.    Time 6    Period Weeks    Status New                   Plan - 07/05/21 1329     Clinical  Impression Statement Pt arrived today doing fair and reports doing okay after last Rx with manual traction. Rx focused on decreasing mm tone in LT cerv paras and UT f/b cerv traction at 12#s and tolerated well. Decreased pain end of session    Personal Factors and Comorbidities Other    Examination-Activity Limitations Sit;Lift    Stability/Clinical Decision Making Evolving/Moderate complexity    Rehab Potential Good    PT Frequency 2x / week    PT Duration 6 weeks    PT Treatment/Interventions ADLs/Self Care Home Management;Cryotherapy;Electrical Stimulation;Ultrasound;Traction;Moist Heat;Therapeutic activities;Therapeutic exercise;Manual techniques;Patient/family education;Passive range of motion    PT Next Visit Plan Chin tucks and cervical extension, postural exercises, Combo e'stim/US, STW/M, may try intermittment cervical traction beginning at 12#. Assess traction and progress if tolerated well    Consulted and Agree with Plan of Care Patient             Patient will benefit from skilled therapeutic intervention in order to improve the following deficits and impairments:  Pain, Decreased activity tolerance, Decreased range of motion, Postural dysfunction, Decreased strength  Visit Diagnosis: Cervicalgia  Abnormal posture     Problem List Patient Active Problem List   Diagnosis Date Noted   Current smoker 11/21/2016   GERD (gastroesophageal reflux disease) 11/21/2016   COPD (chronic obstructive  pulmonary disease) (HCC) 11/21/2016   Aortic atherosclerosis (HCC) 11/21/2016   Essential hypertension 09/29/2014    Darek Eifler,CHRIS, PTA 07/05/2021, 2:22 PM  [facility] [address] [place], [place], [address] Phone: [phone]   Fax:  [phone]  Name: Timothy Nielsen MRN: [id number] Date of Birth: 07-13-1970

## 2021-07-10 ENCOUNTER — Ambulatory Visit: Payer: Self-pay | Admitting: *Deleted

## 2021-07-10 ENCOUNTER — Other Ambulatory Visit: Payer: Self-pay

## 2021-07-10 DIAGNOSIS — R293 Abnormal posture: Secondary | ICD-10-CM

## 2021-07-10 DIAGNOSIS — M542 Cervicalgia: Secondary | ICD-10-CM

## 2021-07-10 NOTE — Therapy (Signed)
[facility] [address] [place], [place], [address] Phone: [phone]   Fax:  [phone]  Physical Therapy Treatment  Patient Details  Name: Timothy Nielsen MRN: [id number] Date of Birth: [birth year] Referring Provider (PT): Nita Sickle MD   Encounter Date: 07/10/2021   PT End of Session - 07/10/21 1159     Visit Number 5    Number of Visits 12    Date for PT Re-Evaluation 07/31/21    PT Start Time 1115    PT Stop Time 1205    PT Time Calculation (min) 50 min             Past Medical History:  Diagnosis Date   Asthma    Hypertension     Past Surgical History:  Procedure Laterality Date   APPENDECTOMY     FRACTURE SURGERY Right    right knee- age 25    There were no vitals filed for this visit.   Subjective Assessment - 07/10/21 1118     Subjective Not as bad today. Did good after last Rx. Decreased LT tingling after last Rx    Pertinent History H/o balance problems going back to childhood (he uses a cane for safety), LE numbness, right knee fracture and surgery, HTN.    How long can you sit comfortably? Less than 5 minutes.    Patient Stated Goals Get out of pain and get rid of symptoms in left UE.    Currently in Pain? Yes    Pain Score 3     Pain Location Neck    Pain Orientation Left    Pain Descriptors / Indicators Aching;Sore    Pain Onset More than a month ago                               [facility] Adult PT Treatment/Exercise - 07/10/21 0001       Exercises   Exercises Neck      Modalities   Modalities Electrical Stimulation;Moist Heat;Ultrasound;Traction      Moist Heat Therapy   Number Minutes Moist Heat 12 Minutes    Moist Heat Location Cervical      Electrical Stimulation   Electrical Stimulation Location Left cervical/UT    Electrical Stimulation Action IFC x 12 mins    Electrical Stimulation Parameters 80-150hz     Electrical Stimulation Goals Pain;Tone      Ultrasound    Ultrasound Location LT cerv paras    Ultrasound Parameters Combo 1.5 w/cm2 x 10 mins    Ultrasound Goals Pain      Traction   Type of Traction Cervical    Min (lbs) 5    Max (lbs) 16    Hold Time 99    Rest Time 5    Time 15      Manual Therapy   Manual Therapy Soft tissue mobilization;Joint mobilization;Manual Traction    Soft tissue mobilization STW x 5 mins to LT cerv. paras    Manual Traction cervical                          PT Long Term Goals - 06/19/21 1340       PT LONG TERM GOAL #1   Title Independent with an HEP.    Time 6    Period Weeks    Status New      PT LONG TERM GOAL #2   Title  Left active cervical rotation to 75 degrees.    Time 6    Period Weeks    Status New      PT LONG TERM GOAL #3   Title Left grip increased by 10#.    Time 6    Period Weeks    Status New      PT LONG TERM GOAL #4   Title Perform ADL's with pain not > 3/10.    Time 6    Period Weeks    Status New                   Plan - 07/10/21 1201     Clinical Impression Statement Pt arrived today reporting doing some better after last Rx with decreased tingling in LT hand. Rx focused on LT cerv musculature with combo, STW IFC f/b traction at 16 #s and was tolerated well.    Personal Factors and Comorbidities Other    Examination-Activity Limitations Sit;Lift    Examination-Participation Restrictions Other    Rehab Potential Good    PT Frequency 2x / week    PT Duration 6 weeks    PT Treatment/Interventions ADLs/Self Care Home Management;Cryotherapy;Electrical Stimulation;Ultrasound;Traction;Moist Heat;Therapeutic activities;Therapeutic exercise;Manual techniques;Patient/family education;Passive range of motion    PT Next Visit Plan Chin tucks and cervical extension, postural exercises, Combo e'stim/US, STW/M,  Assess Traction at 16#s             Patient will benefit from skilled therapeutic intervention in order to improve the following deficits  and impairments:  Pain, Decreased activity tolerance, Decreased range of motion, Postural dysfunction, Decreased strength  Visit Diagnosis: Cervicalgia  Abnormal posture     Problem List Patient Active Problem List   Diagnosis Date Noted   Current smoker 11/21/2016   GERD (gastroesophageal reflux disease) 11/21/2016   COPD (chronic obstructive pulmonary disease) (HCC) 11/21/2016   Aortic atherosclerosis (HCC) 11/21/2016   Essential hypertension 09/29/2014    Kendal Ghazarian,CHRIS, PTA 07/10/2021, 12:05 PM  [facility] [address] [place], [place], [address] Phone: [phone]   Fax:  [phone]  Name: Timothy Nielsen MRN: [id number] Date of Birth: [birth year]

## 2021-07-11 ENCOUNTER — Encounter: Payer: Self-pay | Admitting: Physician Assistant

## 2021-07-11 ENCOUNTER — Other Ambulatory Visit (HOSPITAL_COMMUNITY)
Admission: RE | Admit: 2021-07-11 | Discharge: 2021-07-11 | Disposition: A | Discharge status: Home / Self Care | Payer: Medicaid Other | Source: Ambulatory Visit | Attending: Physician Assistant | Admitting: Physician Assistant

## 2021-07-11 ENCOUNTER — Ambulatory Visit: Payer: Medicaid Other | Admitting: Physician Assistant

## 2021-07-11 VITALS — BP 112/81 | HR 83 | Temp 98.2°F | Wt 268.0 lb

## 2021-07-11 DIAGNOSIS — M5412 Radiculopathy, cervical region: Secondary | ICD-10-CM

## 2021-07-11 DIAGNOSIS — R079 Chest pain, unspecified: Secondary | ICD-10-CM

## 2021-07-11 DIAGNOSIS — R7309 Other abnormal glucose: Secondary | ICD-10-CM

## 2021-07-11 DIAGNOSIS — E785 Hyperlipidemia, unspecified: Secondary | ICD-10-CM

## 2021-07-11 DIAGNOSIS — Z720 Tobacco use: Secondary | ICD-10-CM

## 2021-07-11 DIAGNOSIS — F419 Anxiety disorder, unspecified: Secondary | ICD-10-CM

## 2021-07-11 DIAGNOSIS — Z131 Encounter for screening for diabetes mellitus: Secondary | ICD-10-CM

## 2021-07-11 DIAGNOSIS — I10 Essential (primary) hypertension: Secondary | ICD-10-CM

## 2021-07-11 DIAGNOSIS — R200 Anesthesia of skin: Secondary | ICD-10-CM

## 2021-07-11 DIAGNOSIS — I8392 Asymptomatic varicose veins of left lower extremity: Secondary | ICD-10-CM

## 2021-07-11 DIAGNOSIS — R7989 Other specified abnormal findings of blood chemistry: Secondary | ICD-10-CM

## 2021-07-11 DIAGNOSIS — J449 Chronic obstructive pulmonary disease, unspecified: Secondary | ICD-10-CM

## 2021-07-11 LAB — COMPREHENSIVE METABOLIC PANEL WITH GFR
ALT: 66 U/L — ABNORMAL HIGH (ref 0–44)
AST: 30 U/L (ref 15–41)
Albumin: 4.4 g/dL (ref 3.5–5.0)
Alkaline Phosphatase: 129 U/L — ABNORMAL HIGH (ref 38–126)
Anion gap: 10 (ref 5–15)
BUN: 11 mg/dL (ref 6–20)
CO2: 25 mmol/L (ref 22–32)
Calcium: 9.3 mg/dL (ref 8.9–10.3)
Chloride: 100 mmol/L (ref 98–111)
Creatinine, Ser: 0.97 mg/dL (ref 0.61–1.24)
GFR, Estimated: >60 mL/min (ref >60)
Glucose, Bld: 128 mg/dL — ABNORMAL HIGH (ref 70–99)
Potassium: 4 mmol/L (ref 3.5–5.1)
Sodium: 135 mmol/L (ref 135–145)
Total Bilirubin: 0.8 mg/dL (ref 0.3–1.2)
Total Protein: 7.6 g/dL (ref 6.5–8.1)

## 2021-07-11 LAB — LIPID PANEL
Cholesterol: 148 mg/dL (ref 0–200)
HDL: 36 mg/dL — ABNORMAL LOW (ref >40)
LDL Cholesterol: 85 mg/dL (ref 0–99)
Total CHOL/HDL Ratio: 4.1 RATIO
Triglycerides: 136 mg/dL (ref <150)
VLDL: 27 mg/dL (ref 0–40)

## 2021-07-11 LAB — HEMOGLOBIN A1C
Hgb A1c MFr Bld: 6.2 % — ABNORMAL HIGH (ref 4.8–5.6)
Mean Plasma Glucose: 131.24 mg/dL

## 2021-07-11 MED ORDER — NITROGLYCERIN 0.4 MG SL SUBL: 0.4000 mg | SUBLINGUAL_TABLET | SUBLINGUAL | 3 refills | Status: DC | PRN

## 2021-07-11 NOTE — Progress Notes (Signed)
BP 112/81    Pulse 83    Temp 98.2 F (36.8 C)    Wt 268 lb (121.6 kg)    SpO2 98%    BMI 30.97 kg/m    Subjective:    Patient ID: Timothy Nielsen, male    DOB: 12/13/1970, 51 y.o.   MRN: [id number]  HPI: Timothy Nielsen is a 51 y.o. male presenting on 07/11/2021 for Hyperlipidemia, Hypertension, and COPD   HPI   Chief Complaint  Patient presents with   Hyperlipidemia   Hypertension   COPD       He has seen neurology and is now doing physical therapy.  He doesn't really think the PT is helping.  He is still smoking.   His breathing is improved since cutting back on the smoking.  He didn't get his labs drawn.  He feels chest pressure at times.  In center of chest.  Once every 3 days.  He says it happens more when he gets stressed out.   His face tingles when that happens and his hand tingles (but doesn't know if that's from his pinched nerves)  Neurology recommend LE vascular testing.  He doesn't feel like he doesn't have anxiety but he gets cp when he feels anxious.    He has been having a lot of stress lately- out of work and his mother in [place] has dementia.   He doesn't have anything keeping him here (versus moving there to help her) but he doesn't want to go back to [place] area.      Relevant past medical, surgical, family and social history reviewed and updated as indicated. Interim medical history since our last visit reviewed. Allergies and medications reviewed and updated.   Current Outpatient Medications:    albuterol (VENTOLIN HFA) 108 (90 Base) MCG/ACT inhaler, Inhale 2 puffs into the lungs every 6 (six) hours as needed for wheezing or shortness of breath., Disp: 3 each, Rfl: 0   atorvastatin (LIPITOR) 20 MG tablet, Take 1 tablet (20 mg total) by mouth daily., Disp: 90 tablet, Rfl: 1   fluticasone furoate-vilanterol (BREO ELLIPTA) 100-25 MCG/ACT AEPB, Inhale 1 puff into the lungs daily., Disp: 3 each, Rfl: 3   lisinopril-hydrochlorothiazide (ZESTORETIC) 20-12.5  MG tablet, Take 1 tablet by mouth daily., Disp: 90 tablet, Rfl: 0   omeprazole (PRILOSEC) 40 MG capsule, Take 1 capsule (40 mg total) by mouth daily., Disp: 90 capsule, Rfl: 0   cyanocobalamin (,VITAMIN B-12,) 1000 MCG/ML injection, Inject 1 ml intramuscularly daily for 7 days, then inject 1 ml weekly for 4 weeks, then 1 ml monthly thereafter for 1 year. (Patient not taking: Reported on 07/11/2021), Disp: 10 mL, Rfl: 2  Current Facility-Administered Medications:    methylPREDNISolone acetate (DEPO-MEDROL) injection 40 mg, 40 mg, Intramuscular, Once, Dettinger, Fransisca Kaufmann, MD    Review of Systems  Per HPI unless specifically indicated above     Objective:    BP 112/81    Pulse 83    Temp 98.2 F (36.8 C)    Wt 268 lb (121.6 kg)    SpO2 98%    BMI 30.97 kg/m   Wt Readings from Last 3 Encounters:  07/11/21 268 lb (121.6 kg)  05/21/21 266 lb (120.7 kg)  03/29/21 263 lb (119.3 kg)    Physical Exam Vitals reviewed.  Constitutional:      General: He is not in acute distress.    Appearance: He is well-developed. He is not ill-appearing.  HENT:     Head:  Normocephalic and atraumatic.  Cardiovascular:     Rate and Rhythm: Normal rate and regular rhythm.     Pulses:          Dorsalis pedis pulses are 1+ on the right side and 1+ on the left side.     Comments: Spider veins BLE Pulmonary:     Effort: Pulmonary effort is normal.     Breath sounds: Normal breath sounds. No wheezing.  Abdominal:     General: Bowel sounds are normal.     Palpations: Abdomen is soft.     Tenderness: There is no abdominal tenderness.  Musculoskeletal:     Cervical back: Neck supple.     Right lower leg: No edema.     Left lower leg: No edema.  Lymphadenopathy:     Cervical: No cervical adenopathy.  Skin:    General: Skin is warm and dry.  Neurological:     Mental Status: He is alert and oriented to person, place, and time.  Psychiatric:        Behavior: Behavior normal.    EKG- NSR at 77 bpm.  No  st-t changes.  No changes comp with ekg from 2018       Assessment & Plan:    Encounter Diagnoses  Name Primary?   Chest pain, unspecified type Yes   Primary hypertension    Numbness of toes    Hyperlipidemia, unspecified hyperlipidemia type    Tobacco use    Chronic obstructive pulmonary disease, unspecified COPD type ([place])    Spider vein of left lower extremity    Anxiety    Cervical radiculopathy      -pt to get fasting labs drawn -will schedule ABI -refer cardiology for CP in light of risk factors and symptoms -Pt told that he can get his b-12 injections administered here if needed -pt Declined counseling with [facility] for his anxiety -He says he has cafa -pt was given rx for ntg.  He was counseled at length on how to use it properly.  He is to Get back on daily aspirin -encouraged pt to stop smoking -pt to follow up with EV 3 week.  He is to contact office sooner prn changes or worsening

## 2021-07-13 ENCOUNTER — Encounter: Payer: Self-pay | Admitting: Physical Therapy

## 2021-07-13 ENCOUNTER — Other Ambulatory Visit: Payer: Self-pay

## 2021-07-13 ENCOUNTER — Ambulatory Visit: Payer: Self-pay | Admitting: Physical Therapy

## 2021-07-13 DIAGNOSIS — R293 Abnormal posture: Secondary | ICD-10-CM

## 2021-07-13 DIAGNOSIS — M542 Cervicalgia: Secondary | ICD-10-CM

## 2021-07-13 NOTE — Therapy (Signed)
[facility] [place], [place], [address] Phone: [phone]   Fax:  [phone]  Physical Therapy Treatment  Patient Details  Name: Timothy Nielsen MRN: [id number] Date of Birth: December 28, 1970 Referring Provider (PT): Narda Amber MD   Encounter Date: 07/13/2021   PT End of Session - 07/13/21 1116     Visit Number 6    Number of Visits 12    Date for PT Re-Evaluation 07/31/21    PT Start Time 1121    PT Stop Time 1212    PT Time Calculation (min) 51 min    Activity Tolerance Patient tolerated treatment well    Behavior During Therapy [facility] for tasks assessed/performed             Past Medical History:  Diagnosis Date   Asthma    Hypertension     Past Surgical History:  Procedure Laterality Date   APPENDECTOMY     FRACTURE SURGERY Right    right knee- age 50    There were no vitals filed for this visit.   Subjective Assessment - 07/13/21 1116     Subjective Not bad today but L upper arm soreness yesterday. Neck feels better and no tingling in L hand since beginning traction.    Pertinent History H/o balance problems going back to childhood (he uses a cane for safety), LE numbness, right knee fracture and surgery, HTN.    How long can you sit comfortably? Less than 5 minutes.    Patient Stated Goals Get out of pain and get rid of symptoms in left UE.    Currently in Pain? Yes    Pain Score 1     Pain Location Neck    Pain Orientation Left    Pain Descriptors / Indicators Other (Comment)   stiffness   Pain Type Chronic pain    Pain Onset More than a month ago    Pain Frequency Constant                [facility] PT Assessment - 07/13/21 0001       Assessment   Medical Diagnosis Cervical radiculopathy, left arm pain.    Referring Provider (PT) Narda Amber MD      Precautions   Precautions Fall    Precaution Comments Patient uses a cane for safety.      Restrictions   Weight Bearing Restrictions No                            [facility] Adult PT Treatment/Exercise - 07/13/21 0001       Modalities   Modalities Electrical Stimulation;Moist Heat;Traction;Ultrasound      Moist Heat Therapy   Number Minutes Moist Heat 10 Minutes    Moist Heat Location Cervical      Electrical Stimulation   Electrical Stimulation Location L UT    Electrical Stimulation Action Pre-Mod    Electrical Stimulation Parameters 80-150 hz  x10 min    Electrical Stimulation Goals Pain;Tone      Ultrasound   Ultrasound Location L UT/cervical paraspinals    Ultrasound Parameters Combo 1.5 w/cm2, 100%, 1 mhz x10 min    Ultrasound Goals Pain      Traction   Type of Traction Cervical    Min (lbs) 5    Max (lbs) 16    Hold Time 99    Rest Time 5    Time 15  Manual Therapy   Manual Therapy Soft tissue mobilization;Joint mobilization;Manual Traction    Soft tissue mobilization STW to L UT, levator scapula, cervical paraspinals to reduce stiffness and pain                          PT Long Term Goals - 06/19/21 1340       PT LONG TERM GOAL #1   Title Independent with an HEP.    Time 6    Period Weeks    Status New      PT LONG TERM GOAL #2   Title Left active cervical rotation to 75 degrees.    Time 6    Period Weeks    Status New      PT LONG TERM GOAL #3   Title Left grip increased by 10#.    Time 6    Period Weeks    Status New      PT LONG TERM GOAL #4   Title Perform ADL's with pain not > 3/10.    Time 6    Period Weeks    Status New                   Plan - 07/13/21 1214     Clinical Impression Statement Patient presented in clinic with reports of less cervical symptoms since traction. More stiffness reported with ROM. Patient presented with mild to mod muscle tightness. Normal modalities response noted following removal of the modalities. No complaints from patient after traction increased to 18#.    Personal Factors and Comorbidities  Other    Examination-Activity Limitations Sit;Lift    Examination-Participation Restrictions Other    Stability/Clinical Decision Making Evolving/Moderate complexity    Rehab Potential Good    PT Frequency 2x / week    PT Duration 6 weeks    PT Treatment/Interventions ADLs/Self Care Home Management;Cryotherapy;Electrical Stimulation;Ultrasound;Traction;Moist Heat;Therapeutic activities;Therapeutic exercise;Manual techniques;Patient/family education;Passive range of motion    PT Next Visit Plan Chin tucks and cervical extension, postural exercises, Combo e'stim/US, STW/M,  Assess Traction at 16#s    Consulted and Agree with Plan of Care Patient             Patient will benefit from skilled therapeutic intervention in order to improve the following deficits and impairments:  Pain, Decreased activity tolerance, Decreased range of motion, Postural dysfunction, Decreased strength  Visit Diagnosis: Cervicalgia  Abnormal posture     Problem List Patient Active Problem List   Diagnosis Date Noted   Current smoker 11/21/2016   GERD (gastroesophageal reflux disease) 11/21/2016   COPD (chronic obstructive pulmonary disease) ([place]) 11/21/2016   Aortic atherosclerosis ([place]) 11/21/2016   Essential hypertension 09/29/2014    Standley Brooking, PTA 07/13/2021, 12:29 PM  [facility] [address] [place], [place], [address] Phone: [phone]   Fax:  [phone]  Name: Timothy Nielsen MRN: [id number] Date of Birth: 10/26/1970

## 2021-07-16 ENCOUNTER — Other Ambulatory Visit (HOSPITAL_COMMUNITY): Payer: Self-pay | Admitting: Physician Assistant

## 2021-07-16 ENCOUNTER — Other Ambulatory Visit: Payer: Self-pay | Admitting: Physician Assistant

## 2021-07-16 ENCOUNTER — Other Ambulatory Visit: Payer: Self-pay

## 2021-07-16 DIAGNOSIS — R200 Anesthesia of skin: Secondary | ICD-10-CM

## 2021-07-17 ENCOUNTER — Ambulatory Visit: Payer: Self-pay | Admitting: *Deleted

## 2021-07-17 ENCOUNTER — Other Ambulatory Visit: Payer: Self-pay

## 2021-07-17 DIAGNOSIS — R293 Abnormal posture: Secondary | ICD-10-CM

## 2021-07-17 DIAGNOSIS — M542 Cervicalgia: Secondary | ICD-10-CM

## 2021-07-17 NOTE — Therapy (Signed)
[facility] [address] [place], [place], [address] Phone: [phone]   Fax:  [phone]  Physical Therapy Treatment  Patient Details  Name: Timothy Nielsen MRN: [id number] Date of Birth: 1970/09/14 Referring Provider (PT): Nita Sickle MD   Encounter Date: 07/17/2021   PT End of Session - 07/17/21 1148     Visit Number 7    Number of Visits 12    Date for PT Re-Evaluation 07/31/21    PT Start Time 1115    PT Stop Time 1207    PT Time Calculation (min) 52 min             Past Medical History:  Diagnosis Date   Asthma    Hypertension     Past Surgical History:  Procedure Laterality Date   APPENDECTOMY     FRACTURE SURGERY Right    right knee- age 38    There were no vitals filed for this visit.   Subjective Assessment - 07/17/21 1116     Subjective Not bad today but L upper arm soreness yesterday. Neck feels better and decreased hand symptoms.    Pertinent History H/o balance problems going back to childhood (he uses a cane for safety), LE numbness, right knee fracture and surgery, HTN.    Patient Stated Goals Get out of pain and get rid of symptoms in left UE.    Currently in Pain? Yes    Pain Score 3     Pain Location Neck    Pain Orientation Left    Pain Descriptors / Indicators Sore                               [facility] Adult PT Treatment/Exercise - 07/17/21 0001       Exercises   Exercises Neck      Modalities   Modalities Electrical Stimulation;Moist Heat;Traction;Ultrasound      Moist Heat Therapy   Number Minutes Moist Heat 10 Minutes    Moist Heat Location Cervical      Electrical Stimulation   Electrical Stimulation Location L UT    Electrical Stimulation Action IFC    Electrical Stimulation Parameters 80-150hz  x 15 mins    Electrical Stimulation Goals Pain;Tone      Ultrasound   Ultrasound Location LT cervical paras    Ultrasound Parameters Combo 1.5 w /cm2 x 10 mins     Ultrasound Goals Pain      Traction   Type of Traction Cervical    Min (lbs) 5    Max (lbs) 20    Hold Time 99    Rest Time 5    Time 15                          PT Long Term Goals - 06/19/21 1340       PT LONG TERM GOAL #1   Title Independent with an HEP.    Time 6    Period Weeks    Status New      PT LONG TERM GOAL #2   Title Left active cervical rotation to 75 degrees.    Time 6    Period Weeks    Status New      PT LONG TERM GOAL #3   Title Left grip increased by 10#.    Time 6    Period Weeks    Status New  PT LONG TERM GOAL #4   Title Perform ADL's with pain not > 3/10.    Time 6    Period Weeks    Status New                   Plan - 07/17/21 1148     Clinical Impression Statement Pt arrived reporting doing better after last Rx with decreased LT hand symptoms. His cc was neck stiffness with notable tightness LT side UT and cerv paras. Pt did well with Rx and was able to progress to 20#s cervical traction    Personal Factors and Comorbidities Other    Examination-Activity Limitations Sit;Lift    Stability/Clinical Decision Making Evolving/Moderate complexity    Rehab Potential Good    PT Frequency 2x / week    PT Duration 6 weeks    PT Treatment/Interventions ADLs/Self Care Home Management;Cryotherapy;Electrical Stimulation;Ultrasound;Traction;Moist Heat;Therapeutic activities;Therapeutic exercise;Manual techniques;Patient/family education;Passive range of motion    PT Next Visit Plan Chin tucks and cervical extension, postural exercises, Combo e'stim/US, STW/M,  Assess Traction at 16#s    Consulted and Agree with Plan of Care Patient             Patient will benefit from skilled therapeutic intervention in order to improve the following deficits and impairments:  Pain, Decreased activity tolerance, Decreased range of motion, Postural dysfunction, Decreased strength  Visit Diagnosis: Cervicalgia  Abnormal  posture     Problem List Patient Active Problem List   Diagnosis Date Noted   Current smoker 11/21/2016   GERD (gastroesophageal reflux disease) 11/21/2016   COPD (chronic obstructive pulmonary disease) (HCC) 11/21/2016   Aortic atherosclerosis (HCC) 11/21/2016   Essential hypertension 09/29/2014    Enis Riecke,CHRIS, PTA 07/17/2021, 2:11 PM  [facility] [address] [place], [place], [address] Phone: [phone]   Fax:  [phone]  Name: Timothy Nielsen MRN: [id number] Date of Birth: [birth year]

## 2021-07-20 ENCOUNTER — Other Ambulatory Visit: Payer: Self-pay

## 2021-07-20 ENCOUNTER — Encounter: Payer: Self-pay | Admitting: Physical Therapy

## 2021-07-20 ENCOUNTER — Ambulatory Visit: Payer: Self-pay | Admitting: Physical Therapy

## 2021-07-20 DIAGNOSIS — R293 Abnormal posture: Secondary | ICD-10-CM

## 2021-07-20 DIAGNOSIS — M542 Cervicalgia: Secondary | ICD-10-CM

## 2021-07-20 NOTE — Therapy (Signed)
[facility] [place], [place], [address] Phone: [phone]   Fax:  [phone]  Physical Therapy Treatment  Patient Details  Name: Timothy Nielsen MRN: [id number] Date of Birth: 10-14-1970 Referring Provider (PT): Narda Amber MD   Encounter Date: 07/20/2021   PT End of Session - 07/20/21 1256     Visit Number 8    Number of Visits 12    Date for PT Re-Evaluation 07/31/21    PT Start Time 1117    PT Stop Time 1213    PT Time Calculation (min) 56 min    Activity Tolerance Patient tolerated treatment well    Behavior During Therapy [facility] for tasks assessed/performed             Past Medical History:  Diagnosis Date   Asthma    Hypertension     Past Surgical History:  Procedure Laterality Date   APPENDECTOMY     FRACTURE SURGERY Right    right knee- age 42    There were no vitals filed for this visit.   Subjective Assessment - 07/20/21 1254     Subjective COVID-19 screen performed prior to patient entering clinic.  Sore from last treatment but doing better overall and left UE syptoms decreasing.    Pertinent History H/o balance problems going back to childhood (he uses a cane for safety), LE numbness, right knee fracture and surgery, HTN.    How long can you sit comfortably? Less than 5 minutes.    Patient Stated Goals Get out of pain and get rid of symptoms in left UE.    Currently in Pain? Yes    Pain Score 2     Pain Location Neck    Pain Orientation Left    Pain Descriptors / Indicators Sore    Pain Type Chronic pain    Pain Onset More than a month ago                               [facility] Adult PT Treatment/Exercise - 07/20/21 0001       Modalities   Modalities Electrical Stimulation;Moist Heat      Moist Heat Therapy   Number Minutes Moist Heat 20 Minutes    Moist Heat Location --   Left cervical.     Electrical Stimulation   Electrical Stimulation Location Left cervical/UT     Electrical Stimulation Action IFC at 80-150 Hz.    Electrical Stimulation Parameters 40% scan x 20 minutes.    Electrical Stimulation Goals Pain;Tone      Ultrasound   Ultrasound Location Left cervical.    Ultrasound Parameters Combo e'stim/US at 1.50 W/CM2 x 8 minutes.    Ultrasound Goals Pain      Traction   Type of Traction Cervical    Min (lbs) 5    Max (lbs) 20    Hold Time 99    Rest Time 5    Time 15                          PT Long Term Goals - 06/19/21 1340       PT LONG TERM GOAL #1   Title Independent with an HEP.    Time 6    Period Weeks    Status New      PT LONG TERM GOAL #2   Title Left active cervical rotation to  75 degrees.    Time 6    Period Weeks    Status New      PT LONG TERM GOAL #3   Title Left grip increased by 10#.    Time 6    Period Weeks    Status New      PT LONG TERM GOAL #4   Title Perform ADL's with pain not > 3/10.    Time 6    Period Weeks    Status New                   Plan - 07/20/21 1259     Clinical Impression Statement The patient is responding very well to treatments.  Left intermittment traction as 20#.  Pain at a 2 and a reduction in left UE symptoms noted.    Personal Factors and Comorbidities Other    Examination-Activity Limitations Sit;Lift    Stability/Clinical Decision Making Evolving/Moderate complexity    Rehab Potential Good    PT Frequency 2x / week    PT Duration 6 weeks    PT Treatment/Interventions ADLs/Self Care Home Management;Cryotherapy;Electrical Stimulation;Ultrasound;Traction;Moist Heat;Therapeutic activities;Therapeutic exercise;Manual techniques;Patient/family education;Passive range of motion    PT Next Visit Plan Contine with current POC.    Consulted and Agree with Plan of Care Patient             Patient will benefit from skilled therapeutic intervention in order to improve the following deficits and impairments:  Pain, Decreased activity tolerance,  Decreased range of motion, Postural dysfunction, Decreased strength  Visit Diagnosis: Cervicalgia  Abnormal posture     Problem List Patient Active Problem List   Diagnosis Date Noted   Current smoker 11/21/2016   GERD (gastroesophageal reflux disease) 11/21/2016   COPD (chronic obstructive pulmonary disease) ([place]) 11/21/2016   Aortic atherosclerosis ([place]) 11/21/2016   Essential hypertension 09/29/2014    Lorilyn Laitinen, [place], PT 07/20/2021, 1:02 PM  [facility] [address] [place], [place], [address] Phone: [phone]   Fax:  [phone]  Name: Timothy Nielsen MRN: [id number] Date of Birth: 1971-03-23

## 2021-07-24 ENCOUNTER — Other Ambulatory Visit: Payer: Self-pay

## 2021-07-24 ENCOUNTER — Ambulatory Visit: Payer: Self-pay | Admitting: *Deleted

## 2021-07-24 DIAGNOSIS — R293 Abnormal posture: Secondary | ICD-10-CM

## 2021-07-24 DIAGNOSIS — M542 Cervicalgia: Secondary | ICD-10-CM

## 2021-07-24 NOTE — Therapy (Signed)
[facility] [place], [place], [address] Phone: [phone]   Fax:  [phone]  Physical Therapy Treatment  Patient Details  Name: Timothy Nielsen MRN: [id number] Date of Birth: [birth year] Referring Provider (PT): Narda Amber MD   Encounter Date: 07/24/2021   PT End of Session - 07/24/21 1100     Visit Number 9    Number of Visits 12    Date for PT Re-Evaluation 07/31/21    PT Start Time 1030    PT Stop Time 1120    PT Time Calculation (min) 50 min             Past Medical History:  Diagnosis Date   Asthma    Hypertension     Past Surgical History:  Procedure Laterality Date   APPENDECTOMY     FRACTURE SURGERY Right    right knee- age 69    There were no vitals filed for this visit.   Subjective Assessment - 07/24/21 1030     Subjective COVID-19 screen performed prior to patient entering clinic. Did great from last Rx. Less stiffness now    Pertinent History H/o balance problems going back to childhood (he uses a cane for safety), LE numbness, right knee fracture and surgery, HTN.    How long can you sit comfortably? Less than 5 minutes.    Patient Stated Goals Get out of pain and get rid of symptoms in left UE.    Currently in Pain? Yes    Pain Score 2                                              PT Long Term Goals - 07/24/21 1036       PT LONG TERM GOAL #1   Title Independent with an HEP.    Time 6    Period Weeks    Status On-going      PT LONG TERM GOAL #2   Title Left active cervical rotation to 75 degrees.    Time 6    Period Weeks    Status On-going      PT LONG TERM GOAL #3   Title Left grip increased by 10#.    Baseline 80# Eval, 95#  07-24-21    Time 6    Period Weeks    Status Achieved      PT LONG TERM GOAL #4   Title Perform ADL's with pain not > 3/10.    Time 6    Period Weeks    Status On-going                   Plan -  07/24/21 1102     Clinical Impression Statement Pt arrived today reporting Rxs are helping with decreased neck pain/stiffness as well as referred symptoms in LT hand. Traction was maintained at 20#s today and tolerated well. LT hand grip strength has improved  from 80#s to 95#s LTG met.    Personal Factors and Comorbidities Other    Examination-Activity Limitations Sit;Lift    Stability/Clinical Decision Making Evolving/Moderate complexity    PT Frequency 2x / week    PT Duration 6 weeks    PT Treatment/Interventions ADLs/Self Care Home Management;Cryotherapy;Electrical Stimulation;Ultrasound;Traction;Moist Heat;Therapeutic activities;Therapeutic exercise;Manual techniques;Patient/family education;Passive range of motion    PT Next Visit Plan Contine with current POC.  Consulted and Agree with Plan of Care Patient             Patient will benefit from skilled therapeutic intervention in order to improve the following deficits and impairments:  Pain, Decreased activity tolerance, Decreased range of motion, Postural dysfunction, Decreased strength  Visit Diagnosis: Cervicalgia  Abnormal posture     Problem List Patient Active Problem List   Diagnosis Date Noted   Current smoker 11/21/2016   GERD (gastroesophageal reflux disease) 11/21/2016   COPD (chronic obstructive pulmonary disease) ([place]) 11/21/2016   Aortic atherosclerosis ([place]) 11/21/2016   Essential hypertension 09/29/2014    Aithana Kushner,CHRIS, PTA 07/24/2021, 2:08 PM  [facility] [address] [place], [place], [address] Phone: [phone]   Fax:  [phone]  Name: Reynol Arnone MRN: [id number] Date of Birth: 10/06/1970

## 2021-07-25 ENCOUNTER — Other Ambulatory Visit: Payer: Self-pay | Admitting: Family

## 2021-07-25 DIAGNOSIS — I10 Essential (primary) hypertension: Secondary | ICD-10-CM

## 2021-07-25 DIAGNOSIS — I700 Atherosclerosis of aorta: Secondary | ICD-10-CM

## 2021-07-25 DIAGNOSIS — F17200 Nicotine dependence, unspecified, uncomplicated: Secondary | ICD-10-CM

## 2021-07-26 ENCOUNTER — Other Ambulatory Visit: Payer: Self-pay

## 2021-07-26 ENCOUNTER — Ambulatory Visit (HOSPITAL_COMMUNITY)
Admission: RE | Admit: 2021-07-26 | Discharge: 2021-07-26 | Disposition: A | Discharge status: Home / Self Care | Payer: Self-pay | Source: Ambulatory Visit | Attending: Physician Assistant | Admitting: Physician Assistant

## 2021-07-26 ENCOUNTER — Ambulatory Visit (HOSPITAL_COMMUNITY): Payer: Medicaid Other

## 2021-07-26 DIAGNOSIS — R200 Anesthesia of skin: Secondary | ICD-10-CM | POA: Insufficient documentation

## 2021-07-27 ENCOUNTER — Ambulatory Visit: Payer: Self-pay | Attending: Neurology | Admitting: Physical Therapy

## 2021-07-27 DIAGNOSIS — M542 Cervicalgia: Secondary | ICD-10-CM | POA: Insufficient documentation

## 2021-07-27 DIAGNOSIS — R293 Abnormal posture: Secondary | ICD-10-CM | POA: Insufficient documentation

## 2021-07-27 NOTE — Therapy (Signed)
[facility] [address] [place], [place], [address] Phone: [phone]   Fax:  [phone]  Physical Therapy Treatment  Patient Details  Name: Timothy Nielsen MRN: [id number] Date of Birth: 1970/09/14 Referring Provider (PT): Nita Sickle MD   Encounter Date: 07/27/2021   PT End of Session - 07/27/21 1416     Visit Number 10    Number of Visits 12    Date for PT Re-Evaluation 07/31/21    PT Start Time 1115    PT Stop Time 1208    PT Time Calculation (min) 53 min    Activity Tolerance Patient tolerated treatment well    Behavior During Therapy [facility] for tasks assessed/performed             Past Medical History:  Diagnosis Date   Asthma    Hypertension     Past Surgical History:  Procedure Laterality Date   APPENDECTOMY     FRACTURE SURGERY Right    right knee- age 83    There were no vitals filed for this visit.   Subjective Assessment - 07/27/21 1413     Subjective COVID-19 screen performed prior to patient entering clinic.  Stiffness but doing much better overall.    Pertinent History H/o balance problems going back to childhood (he uses a cane for safety), LE numbness, right knee fracture and surgery, HTN.    How long can you sit comfortably? Less than 5 minutes.    Patient Stated Goals Get out of pain and get rid of symptoms in left UE.    Currently in Pain? Yes    Pain Score 2     Pain Location Neck    Pain Orientation Left    Pain Descriptors / Indicators Sore    Pain Onset More than a month ago                [facility] PT Assessment - 07/27/21 0001       AROM   Overall AROM Comments Left active cervical rotation is 75 degrees.                           [facility] Adult PT Treatment/Exercise - 07/27/21 0001       Ultrasound   Ultrasound Location Left cervical.    Ultrasound Parameters Combo e'stim/US at 1.50 W/CM2 x 12 minutes.    Ultrasound Goals Pain      Traction   Type of Traction  Cervical    Min (lbs) 5    Max (lbs) 20    Hold Time 99    Rest Time 5    Time 15      Manual Therapy   Manual Therapy Soft tissue mobilization    Soft tissue mobilization STW/M x 11 minutes to patient's left cervical/UT musculature.                          PT Long Term Goals - 07/27/21 1420       PT LONG TERM GOAL #1   Title Independent with an HEP.    Time 6    Period Weeks    Status On-going      PT LONG TERM GOAL #2   Title Left active cervical rotation to 75 degrees.    Time 6    Period Weeks    Status Achieved      PT LONG TERM GOAL #3  Title Left grip increased by 10#.    Baseline 80# Eval, 95#  07-24-21    Time 6    Period Weeks    Status Achieved      PT LONG TERM GOAL #4   Title Perform ADL's with pain not > 3/10.    Period Weeks    Status Achieved                   Plan - 07/27/21 1426     Clinical Impression Statement See "Therapy Note" section.    Personal Factors and Comorbidities Other    Examination-Activity Limitations Sit;Lift    Examination-Participation Restrictions Other    Stability/Clinical Decision Making Evolving/Moderate complexity    Rehab Potential Good    PT Frequency 2x / week    PT Duration 6 weeks    PT Treatment/Interventions ADLs/Self Care Home Management;Cryotherapy;Electrical Stimulation;Ultrasound;Traction;Moist Heat;Therapeutic activities;Therapeutic exercise;Manual techniques;Patient/family education;Passive range of motion    PT Next Visit Plan Contine with current POC.    Consulted and Agree with Plan of Care Patient             Patient will benefit from skilled therapeutic intervention in order to improve the following deficits and impairments:  Pain, Decreased activity tolerance, Decreased range of motion, Postural dysfunction, Decreased strength  Visit Diagnosis: Cervicalgia  Abnormal posture     Problem List Patient Active Problem List   Diagnosis Date Noted   Current  smoker 11/21/2016   GERD (gastroesophageal reflux disease) 11/21/2016   COPD (chronic obstructive pulmonary disease) (HCC) 11/21/2016   Aortic atherosclerosis (HCC) 11/21/2016   Essential hypertension 09/29/2014   Progress Note Reporting Period 06/19/21 to 07/27/21.  See note below for Objective Data and Assessment of Progress/Goals. Excellent response to treatments and progression toward goals as above.    Damion Kant, [place], PT 07/27/2021, 2:27 PM  [facility] [address] [place], [place], [address] Phone: [phone]   Fax:  [phone]  Name: Timothy Nielsen MRN: [id number] Date of Birth: 12/18/1970

## 2021-07-31 ENCOUNTER — Ambulatory Visit: Payer: Self-pay

## 2021-07-31 ENCOUNTER — Other Ambulatory Visit: Payer: Self-pay

## 2021-07-31 DIAGNOSIS — R293 Abnormal posture: Secondary | ICD-10-CM

## 2021-07-31 DIAGNOSIS — M542 Cervicalgia: Secondary | ICD-10-CM

## 2021-07-31 NOTE — Therapy (Signed)
[facility] [address] [place], [place], [address] Phone: [phone]   Fax:  [phone]  Physical Therapy Treatment  Patient Details  Name: Timothy Nielsen MRN: [id number] Date of Birth: January 13, 1971 Referring Provider (PT): Nita Sickle MD   Encounter Date: 07/31/2021   PT End of Session - 07/31/21 1139     Visit Number 11    Number of Visits 12    Date for PT Re-Evaluation 07/31/21    PT Start Time 1117    PT Stop Time 1154    PT Time Calculation (min) 37 min    Activity Tolerance Patient tolerated treatment well    Behavior During Therapy [facility] for tasks assessed/performed             Past Medical History:  Diagnosis Date   Asthma    Hypertension     Past Surgical History:  Procedure Laterality Date   APPENDECTOMY     FRACTURE SURGERY Right    right knee- age 57    There were no vitals filed for this visit.   Subjective Assessment - 07/31/21 1117     Subjective COVID-19 screen performed prior to patient entering clinic.  Patient reports that the left side of his neck is tight today.    Pertinent History H/o balance problems going back to childhood (he uses a cane for safety), LE numbness, right knee fracture and surgery, HTN.    How long can you sit comfortably? Less than 5 minutes.    Patient Stated Goals Get out of pain and get rid of symptoms in left UE.    Currently in Pain? Yes    Pain Score 1     Pain Location Neck    Pain Orientation Left    Pain Descriptors / Indicators Tightness    Pain Onset More than a month ago                               [facility] Adult PT Treatment/Exercise - 07/31/21 0001       Neck Exercises: Seated   Neck Retraction 20 reps    Cervical Rotation Both;20 reps      Neck Exercises: Stretches   Upper Trapezius Stretch Left;4 reps;30 seconds      Traction   Type of Traction Cervical    Min (lbs) 5    Max (lbs) 20    Hold Time 99    Rest Time 5    Time 15       Manual Therapy   Manual Therapy Joint mobilization;Soft tissue mobilization    Joint Mobilization Right UPA's grade I-III to lower cervical    Soft tissue mobilization left UT and levator scap                          PT Long Term Goals - 07/27/21 1420       PT LONG TERM GOAL #1   Title Independent with an HEP.    Time 6    Period Weeks    Status On-going      PT LONG TERM GOAL #2   Title Left active cervical rotation to 75 degrees.    Time 6    Period Weeks    Status Achieved      PT LONG TERM GOAL #3   Title Left grip increased by 10#.    Baseline 80# Eval, 95#  07-24-21  Time 6    Period Weeks    Status Achieved      PT LONG TERM GOAL #4   Title Perform ADL's with pain not > 3/10.    Period Weeks    Status Achieved                   Plan - 07/31/21 1139     Clinical Impression Statement Treatment was initiated with light cervical AROM and manual therapy for improved cervical mobility and reduced tightness. This was minimally effective at reducing his symptoms, but it also did not aggravate his symptoms. Cervical traction was utilized at the conclusion of treatment with this being significantly effective at reducing his familiar symptoms. He reported feeling good upon the conclusion of treatment. He would benefit from additional physical therapy to address his remaining impairments to maximize his functional mobility.    Personal Factors and Comorbidities Other    Examination-Activity Limitations Sit;Lift    Examination-Participation Restrictions Other    Stability/Clinical Decision Making Evolving/Moderate complexity    Rehab Potential Good    PT Frequency 2x / week    PT Duration 6 weeks    PT Treatment/Interventions ADLs/Self Care Home Management;Cryotherapy;Electrical Stimulation;Ultrasound;Traction;Moist Heat;Therapeutic activities;Therapeutic exercise;Manual techniques;Patient/family education;Passive range of motion    PT Next  Visit Plan Contine with current POC.    Consulted and Agree with Plan of Care Patient             Patient will benefit from skilled therapeutic intervention in order to improve the following deficits and impairments:  Pain, Decreased activity tolerance, Decreased range of motion, Postural dysfunction, Decreased strength  Visit Diagnosis: Cervicalgia  Abnormal posture     Problem List Patient Active Problem List   Diagnosis Date Noted   Current smoker 11/21/2016   GERD (gastroesophageal reflux disease) 11/21/2016   COPD (chronic obstructive pulmonary disease) (HCC) 11/21/2016   Aortic atherosclerosis (HCC) 11/21/2016   Essential hypertension 09/29/2014    Granville Lewis, PT 07/31/2021, 12:01 PM  [facility] [address] [place], [place], [address] Phone: [phone]   Fax:  [phone]  Name: Esiah Bazinet MRN: [id number] Date of Birth: 1971/05/02

## 2021-08-02 ENCOUNTER — Ambulatory Visit: Payer: Medicaid Other | Admitting: Physician Assistant

## 2021-08-02 ENCOUNTER — Encounter: Payer: Self-pay | Admitting: Physician Assistant

## 2021-08-02 ENCOUNTER — Other Ambulatory Visit: Payer: Self-pay | Admitting: Physician Assistant

## 2021-08-02 DIAGNOSIS — K219 Gastro-esophageal reflux disease without esophagitis: Secondary | ICD-10-CM

## 2021-08-02 DIAGNOSIS — I10 Essential (primary) hypertension: Secondary | ICD-10-CM

## 2021-08-02 DIAGNOSIS — M5412 Radiculopathy, cervical region: Secondary | ICD-10-CM

## 2021-08-02 DIAGNOSIS — J449 Chronic obstructive pulmonary disease, unspecified: Secondary | ICD-10-CM

## 2021-08-02 DIAGNOSIS — E785 Hyperlipidemia, unspecified: Secondary | ICD-10-CM

## 2021-08-02 DIAGNOSIS — I739 Peripheral vascular disease, unspecified: Secondary | ICD-10-CM

## 2021-08-02 DIAGNOSIS — Z720 Tobacco use: Secondary | ICD-10-CM

## 2021-08-02 DIAGNOSIS — R7303 Prediabetes: Secondary | ICD-10-CM

## 2021-08-02 DIAGNOSIS — R7989 Other specified abnormal findings of blood chemistry: Secondary | ICD-10-CM

## 2021-08-02 NOTE — Progress Notes (Signed)
There were no vitals taken for this visit.   Subjective:    Patient ID: Timothy Nielsen, male    DOB: May 20, 1971, 51 y.o.   MRN: [id number]  HPI: Timothy Nielsen is a 51 y.o. male presenting on 08/02/2021 for No chief complaint on file.   HPI  This is a telemedicine appointment through [employer]  I connected with  Timothy Nielsen on 08/02/21 by a video enabled telemedicine application and verified that I am speaking with the correct person using two identifiers.   I discussed the limitations of evaluation and management by telemedicine. The patient expressed understanding and agreed to proceed.  Pt is at home.  Provider is at office.   Pt is 50yoM with appointment to follow up.   He has HTN, dyslipidemia, COPD,  cervical radiculopathy, neuropathy.  He has been evaluated by neurology.  He says he has 1 more Physical Therapy appointment this Friday.  He has appointment with cardiology march 1 for evaluation of CP.   He continues to have chest "pressure" that lasts an hour but he hasn't taken the ntg.  He has no new symptoms today.  He continues to smoke.         Relevant past medical, surgical, family and social history reviewed and updated as indicated. Interim medical history since our last visit reviewed. Allergies and medications reviewed and updated.   Current Outpatient Medications:    albuterol (VENTOLIN HFA) 108 (90 Base) MCG/ACT inhaler, Inhale 2 puffs into the lungs every 6 (six) hours as needed for wheezing or shortness of breath., Disp: 3 each, Rfl: 0   aspirin EC 81 MG tablet, Take 81 mg by mouth daily. Swallow whole., Disp: , Rfl:    atorvastatin (LIPITOR) 20 MG tablet, Take 1 tablet (20 mg total) by mouth daily., Disp: 90 tablet, Rfl: 1   fluticasone furoate-vilanterol (BREO ELLIPTA) 100-25 MCG/ACT AEPB, Inhale 1 puff into the lungs daily., Disp: 3 each, Rfl: 3   lisinopril-hydrochlorothiazide (ZESTORETIC) 20-12.5 MG tablet, Take 1 tablet by mouth daily., Disp: 90 tablet, Rfl: 0    omeprazole (PRILOSEC) 40 MG capsule, Take 1 capsule (40 mg total) by mouth daily., Disp: 90 capsule, Rfl: 0   cyanocobalamin (,VITAMIN B-12,) 1000 MCG/ML injection, Inject 1 ml intramuscularly daily for 7 days, then inject 1 ml weekly for 4 weeks, then 1 ml monthly thereafter for 1 year. (Patient not taking: Reported on 07/11/2021), Disp: 10 mL, Rfl: 2   nitroGLYCERIN (NITROSTAT) 0.4 MG SL tablet, Place 1 tablet (0.4 mg total) under the tongue every 5 (five) minutes as needed for chest pain. (Patient not taking: Reported on 08/02/2021), Disp: 25 tablet, Rfl: 3    Review of Systems  Per HPI unless specifically indicated above     Objective:    There were no vitals taken for this visit.  Wt Readings from Last 3 Encounters:  07/11/21 268 lb (121.6 kg)  05/21/21 266 lb (120.7 kg)  03/29/21 263 lb (119.3 kg)    Physical Exam Constitutional:      General: He is not in acute distress.    Appearance: He is not toxic-appearing.  HENT:     Head: Normocephalic and atraumatic.  Pulmonary:     Effort: Pulmonary effort is normal. No respiratory distress.     Comments: Pt is talking in complete sentences without dyspnea Neurological:     Mental Status: He is alert and oriented to person, place, and time.  Psychiatric:        Attention and  Perception: Attention normal.        Speech: Speech normal.        Behavior: Behavior is cooperative.    Results for orders placed or performed during the hospital encounter of 07/11/21  Hemoglobin A1c  Result Value Ref Range   Hgb A1c MFr Bld 6.2 (H) 4.8 - 5.6 %   Mean Plasma Glucose 131.24 mg/dL  Lipid panel  Result Value Ref Range   Cholesterol 148 0 - 200 mg/dL   Triglycerides 373 <428 mg/dL   HDL 36 (L) >76 mg/dL   Total CHOL/HDL Ratio 4.1 RATIO   VLDL 27 0 - 40 mg/dL   LDL Cholesterol 85 0 - 99 mg/dL  Comprehensive metabolic panel  Result Value Ref Range   Sodium 135 135 - 145 mmol/L   Potassium 4.0 3.5 - 5.1 mmol/L   Chloride 100 98 - 111  mmol/L   CO2 25 22 - 32 mmol/L   Glucose, Bld 128 (H) 70 - 99 mg/dL   BUN 11 6 - 20 mg/dL   Creatinine, Ser 8.11 0.61 - 1.24 mg/dL   Calcium 9.3 8.9 - 57.2 mg/dL   Total Protein 7.6 6.5 - 8.1 g/dL   Albumin 4.4 3.5 - 5.0 g/dL   AST 30 15 - 41 U/L   ALT 66 (H) 0 - 44 U/L   Alkaline Phosphatase 129 (H) 38 - 126 U/L   Total Bilirubin 0.8 0.3 - 1.2 mg/dL   GFR, Estimated >62 >03 mL/min   Anion gap 10 5 - 15      Assessment & Plan:   Encounter Diagnoses  Name Primary?   PAD (peripheral artery disease) (HCC) Yes   Tobacco use    Chronic obstructive pulmonary disease, unspecified COPD type (HCC)    Primary hypertension    Hyperlipidemia, unspecified hyperlipidemia type    Gastroesophageal reflux disease, unspecified whether esophagitis present    Elevated LFTs    Cervical radiculopathy    Prediabetes       -reviewed results blood test  prediabetes -pt was counseled on prediabetes and encouraged to watch diet with limiting sweets and eating well balanced meals where carbohydrates are only a portion of the meal.  Also encouraged regular exercise and weight management  Elevated LFTs -pt encouraged to Stop etoh -pt encouraged to avoid APAP -will monitor  COPD -pt is urged to Stop smoking  -he is to continue the breo and albuterol inhalers  dyslipidemia -He is on atorvastatin and is encouraged to follow lowfat diet  PAD -reviewed results ABI with pt -pt is urged to stop smoking -pt is on atorvastatin.  will consider increasing dosage if LFTs improve  CP -pt is taking ASA daily and has appointment with cardiology for evaluation.  He is encouraged to use the NTG when he gets CP or chest "pressure" -he is on statin -encouraged smoking cessation  GERD -On ppi  Cervical Radiculopathy -continue with PT as ordered by neurology  Pt to follow up 1 month..  he is to contact office sooner prn

## 2021-08-03 ENCOUNTER — Other Ambulatory Visit: Payer: Self-pay

## 2021-08-03 ENCOUNTER — Ambulatory Visit: Payer: Self-pay

## 2021-08-03 DIAGNOSIS — M542 Cervicalgia: Secondary | ICD-10-CM

## 2021-08-03 DIAGNOSIS — R293 Abnormal posture: Secondary | ICD-10-CM

## 2021-08-03 NOTE — Therapy (Signed)
[facility] [place], [place], [address] Phone: [phone]   Fax:  [phone]  Physical Therapy Treatment  Patient Details  Name: Neal Trulson MRN: [id number] Date of Birth: Sep 16, 1970 Referring Provider (PT): Narda Amber MD   Encounter Date: 08/03/2021   PT End of Session - 08/03/21 1117     Visit Number 12    Number of Visits 12    Date for PT Re-Evaluation 07/31/21    PT Start Time 1116    PT Stop Time 1157    PT Time Calculation (min) 41 min    Activity Tolerance Patient tolerated treatment well    Behavior During Therapy [facility] for tasks assessed/performed             Past Medical History:  Diagnosis Date   Asthma    Hypertension     Past Surgical History:  Procedure Laterality Date   APPENDECTOMY     FRACTURE SURGERY Right    right knee- age 43    There were no vitals filed for this visit.   Subjective Assessment - 08/03/21 1117     Subjective COVID-19 screen performed prior to patient entering clinic.  Patient reports that he feels good today and feels comfortable with today being his last day.    Pertinent History H/o balance problems going back to childhood (he uses a cane for safety), LE numbness, right knee fracture and surgery, HTN.    How long can you sit comfortably? Less than 5 minutes.    Patient Stated Goals Get out of pain and get rid of symptoms in left UE.    Currently in Pain? No/denies    Pain Onset More than a month ago                               [facility] Adult PT Treatment/Exercise - 08/03/21 0001       Neck Exercises: Theraband   Scapula Retraction Other (comment)   2 minutes     Neck Exercises: Seated   Neck Retraction Other reps (comment)   2 minutes     Traction   Type of Traction Cervical    Min (lbs) 5    Max (lbs) 20    Hold Time 99    Rest Time 5    Time 20                          PT Long Term Goals - 08/03/21 1205        PT LONG TERM GOAL #1   Title Independent with an HEP.    Time 6    Period Weeks    Status Achieved      PT LONG TERM GOAL #2   Title Left active cervical rotation to 75 degrees.    Time 6    Period Weeks    Status Achieved      PT LONG TERM GOAL #3   Title Left grip increased by 10#.    Baseline 80# Eval, 95#  07-24-21    Time 6    Period Weeks    Status Achieved      PT LONG TERM GOAL #4   Title Perform ADL's with pain not > 3/10.    Period Weeks    Status Achieved  Plan - 08/03/21 1159     Clinical Impression Statement Patient presented to his appointment today reported that his neck felt good and he felt comfortable being discharged at this time. His HEP was reviewed and he was able to properly demonstrate these interventions. He reported feeling comfortable with these exercises. He reported that his neck felt good upon the conclusion of treatment and he felt comfortable being discharged at this time.    Personal Factors and Comorbidities Other    Examination-Activity Limitations Sit;Lift    Examination-Participation Restrictions Other    Stability/Clinical Decision Making Evolving/Moderate complexity    Rehab Potential Good    PT Frequency 2x / week    PT Duration 6 weeks    PT Treatment/Interventions ADLs/Self Care Home Management;Cryotherapy;Electrical Stimulation;Ultrasound;Traction;Moist Heat;Therapeutic activities;Therapeutic exercise;Manual techniques;Patient/family education;Passive range of motion    PT Next Visit Plan Contine with current POC.    Consulted and Agree with Plan of Care Patient             Patient will benefit from skilled therapeutic intervention in order to improve the following deficits and impairments:  Pain, Decreased activity tolerance, Decreased range of motion, Postural dysfunction, Decreased strength  Visit Diagnosis: Cervicalgia  Abnormal posture     Problem List Patient Active Problem List    Diagnosis Date Noted   Current smoker 11/21/2016   GERD (gastroesophageal reflux disease) 11/21/2016   COPD (chronic obstructive pulmonary disease) ([place]) 11/21/2016   Aortic atherosclerosis ([place]) 11/21/2016   Essential hypertension 09/29/2014    Darlin Coco, PT 08/03/2021, 12:09 PM  [facility] [address] [place], [place], [address] Phone: [phone]   Fax:  [phone]  Name: Burle Kwan MRN: [id number] Date of Birth: 02-16-1971  PHYSICAL THERAPY DISCHARGE SUMMARY  Visits from Start of Care: 12  Current functional level related to goals / functional outcomes: Patient was able to meet all of his goals for physical therapy.    Remaining deficits: None    Education / Equipment: HEP    Patient agrees to discharge. Patient goals were met. Patient is being discharged due to meeting the stated rehab goals.

## 2021-08-21 ENCOUNTER — Encounter: Payer: Self-pay | Admitting: Cardiology

## 2021-08-21 NOTE — Progress Notes (Signed)
? ? ?Cardiology Office Note ? ?Date: 08/22/2021  ? ?ID: Timothy Nielsen, DOB 1970/07/12, MRN [id number] ? ?PCP:  Jacquelin Hawking, PA-C  ?Cardiologist:  Nona Dell, MD ?Electrophysiologist:  None  ? ?Chief Complaint  ?Patient presents with  ? Chest Pain  ? ? ?History of Present Illness:  ?Timothy Nielsen is a 51 y.o. male referred for cardiology consultation by Ms. McElroy PA-C for evaluation of chest pain.  He presents with both claudication that has been worsening over the last year, right greater than left, and also dyspnea on exertion with probable angina pectoris.  He has a longstanding history of tobacco abuse, has not been able to quit but is trying to cut back.  He did not tolerate nicotine patches due to skin reactions.  He does report compliance with his medications, blood pressure is well controlled today.  His last LDL was 85 on Lipitor 20 mg daily. ? ?He has not undergone any prior ischemic testing.  ABIs done recently were consistent with PAD predominantly affecting the right lower extremity. ? ?I reviewed his medications which are outlined below.  He was given nitroglycerin, has used this on occasion with improvement in chest discomfort. ? ?Past Medical History:  ?Diagnosis Date  ? Asthma   ? Hypertension   ? Sydenham's chorea   ? 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 ? ?Past Surgical History:  ?Procedure Laterality Date  ? APPENDECTOMY    ? FRACTURE SURGERY Right   ? Right knee- age 64  ? ? ?Current Outpatient Medications  ?Medication Sig Dispense Refill  ? albuterol (VENTOLIN HFA) 108 (90 Base) MCG/ACT inhaler INHALE 2 PUFFS BY MOUTH EVERY 6 HOURS AS NEEDED FOR COUGHING, WHEEZING, OR SHORTNESS OF BREATH 20.1 g 0  ? aspirin EC 81 MG tablet Take 81 mg by mouth daily. Swallow whole.    ? atorvastatin (LIPITOR) 20 MG tablet Take 1 tablet (20 mg total) by mouth daily. 90 tablet 1  ? fluticasone furoate-vilanterol (BREO ELLIPTA) 100-25 MCG/ACT AEPB Inhale 1 puff into the lungs daily. 3  each 3  ? lisinopril-hydrochlorothiazide (ZESTORETIC) 20-12.5 MG tablet TAKE 1 Tablet BY MOUTH ONCE EVERY DAY 90 tablet 0  ? nitroGLYCERIN (NITROSTAT) 0.4 MG SL tablet Place 1 tablet (0.4 mg total) under the tongue every 5 (five) minutes as needed for chest pain. 25 tablet 3  ? omeprazole (PRILOSEC) 40 MG capsule TAKE 1 Capsule BY MOUTH ONCE EVERY DAY 90 capsule 0  ? cyanocobalamin (,VITAMIN B-12,) 1000 MCG/ML injection Inject 1 ml intramuscularly daily for 7 days, then inject 1 ml weekly for 4 weeks, then 1 ml monthly thereafter for 1 year. (Patient not taking: Reported on 07/11/2021) 10 mL 2  ? ?No current facility-administered medications for this visit.  ? ?Allergies:  Patient has no known allergies.  ? ?Social History: The patient  reports that he has been smoking cigarettes. He started smoking about 34 years ago. He has a 3.75 pack-year smoking history. He has never used smokeless tobacco. He reports current alcohol use of about 18.0 standard drinks per week. He reports that he does not use drugs.  ? ?Family History: The patient's family history includes Alzheimer's disease in his mother; Diabetes in his father and mother; Hypertension in his mother.  ? ?ROS: No orthopnea or PND.  History of posttussive syncope. ? ?Physical Exam: ?VS:  BP 122/80   Pulse 88   Ht 6\' 6"  (1.981 m)   Wt 269 lb 9.6 oz (122.3 kg)  SpO2 96%   BMI 31.16 kg/m? , BMI Body mass index is 31.16 kg/m?. ? ?Wt Readings from Last 3 Encounters:  ?08/22/21 269 lb 9.6 oz (122.3 kg)  ?07/11/21 268 lb (121.6 kg)  ?05/21/21 266 lb (120.7 kg)  ?  ?General: Patient appears comfortable at rest. ?HEENT: Conjunctiva and lids normal, wearing a mask. ?Neck: Supple, no elevated JVP or carotid bruits, no thyromegaly. ?Lungs: Clear to auscultation, nonlabored breathing at rest. ?Cardiac: Decreased breath sounds without wheezing, no S3 or significant systolic murmur, no pericardial rub. ?Abdomen: Soft, nontender, bowel sounds present. ?Extremities:  Decreased DPs right greater than left, no ulcerations. ?Skin: Warm and dry. ?Musculoskeletal: No kyphosis. ?Neuropsychiatric: Alert and oriented x3, affect grossly appropriate. ? ?ECG:  An ECG dated 07/11/2021 was personally reviewed today and demonstrated:  Sinus rhythm. ? ?Recent Labwork: ?07/11/2021: ALT 66; AST 30; BUN 11; Creatinine, Ser 0.97; Potassium 4.0; Sodium 135  ?   ?Component Value Date/Time  ? CHOL 148 07/11/2021 0922  ? CHOL 142 03/30/2019 1618  ? TRIG 136 07/11/2021 0922  ? HDL 36 (L) 07/11/2021 8299  ? HDL 55 03/30/2019 1618  ? CHOLHDL 4.1 07/11/2021 0922  ? VLDL 27 07/11/2021 0922  ? LDLCALC 85 07/11/2021 0922  ? LDLCALC 72 03/30/2019 1618  ? ? ?Other Studies Reviewed Today: ? ?ABIs 07/26/2021: ?FINDINGS: ?Right ABI:  0.69 ?  ?Left ABI:  1.16 ?  ?Right Lower Extremity: Monophasic waveform seen at posterior tibial ?and dorsalis pedis arteries. ?  ?Left Lower Extremity: Multiphasic waveform seen in the posterior ?tibial and dorsalis pedis arteries. ?  ?0.5-0.79 Moderate PAD ?  ?IMPRESSION: ?Moderate peripheral arterial disease of the right lower extremity. ? ?Assessment and Plan: ? ?1.  Dyspnea on exertion and angina pectoris in a 51 year old male with longstanding history of tobacco abuse, hypertension, and evidence of PAD with claudication and recent abnormal ABIs.  Plan is to begin with a Lexiscan Myoview and echocardiogram to assess cardiac structure and function and ischemic burden.  He is on aspirin and Zestoretic with as needed nitroglycerin.  Increase Lipitor to 40 mg daily as well.  Further plans to follow. ? ?2.  PAD with claudication, right worse than left and abnormal ABIs as noted above.  We discussed critical importance of smoking cessation.  Continue aspirin and statin.  We will refer to our vascular team for further evaluation. ? ?3.  Mixed hyperlipidemia, LDL 85 on Lipitor 20 mg daily.  Increase dose to 40 mg daily.  Would ideally like to get LDL 55 or less. ? ?Medication  Adjustments/Labs and Tests Ordered: ?Current medicines are reviewed at length with the patient today.  Concerns regarding medicines are outlined above.  ? ?Tests Ordered: ?Orders Placed This Encounter  ?Procedures  ? NM Myocar Multi W/Spect W/Wall Motion / EF  ? Ambulatory referral to Cardiology  ? ECHOCARDIOGRAM COMPLETE  ? ? ?Medication Changes: ?No orders of the defined types were placed in this encounter. ? ? ?Disposition:  Follow up  test results. ? ?Signed, ?Jonelle Sidle, MD, [facility] ?08/22/2021 11:17 AM    ?[facility] ?[address] [place], [place], [place] [address] ?Phone: [phone]; Fax: [phone]  ?

## 2021-08-22 ENCOUNTER — Ambulatory Visit (INDEPENDENT_AMBULATORY_CARE_PROVIDER_SITE_OTHER): Payer: Self-pay | Admitting: Cardiology

## 2021-08-22 ENCOUNTER — Encounter: Payer: Self-pay | Admitting: Cardiology

## 2021-08-22 VITALS — BP 122/80 | HR 88 | Ht 78.0 in | Wt 269.6 lb

## 2021-08-22 DIAGNOSIS — I739 Peripheral vascular disease, unspecified: Secondary | ICD-10-CM

## 2021-08-22 DIAGNOSIS — R0602 Shortness of breath: Secondary | ICD-10-CM

## 2021-08-22 DIAGNOSIS — E782 Mixed hyperlipidemia: Secondary | ICD-10-CM

## 2021-08-22 DIAGNOSIS — R079 Chest pain, unspecified: Secondary | ICD-10-CM

## 2021-08-22 DIAGNOSIS — Z720 Tobacco use: Secondary | ICD-10-CM

## 2021-08-22 MED ORDER — ATORVASTATIN CALCIUM 40 MG PO TABS: 40.0000 mg | ORAL_TABLET | Freq: Every day | ORAL | 1 refills | Status: DC

## 2021-08-22 NOTE — Patient Instructions (Addendum)
Medication Instructions:  ?Your physician has recommended you make the following change in your medication:  ?Increase Lipitor to 40 mg once a day ?Continue all other medications as directed  ? ?Labwork: ?none ? ?Testing/Procedures: ?Your physician has requested that you have an echocardiogram. Echocardiography is a painless test that uses sound waves to create images of your heart. It provides your doctor with information about the size and shape of your heart and how well your heart?s chambers and valves are working. This procedure takes approximately one hour. There are no restrictions for this procedure.  ? ?Your physician has requested that you have a lexiscan myoview. For further information please visit [email or web address]. Please follow instruction sheet, as given.  ? ?Follow-Up: ?Your physician recommends that you schedule a follow-up appointment in: follow up pending ? ?Any Other Special Instructions Will Be Listed Below (If Applicable). ?Referral made to peripheral vascular ? ?If you need a refill on your cardiac medications before your next appointment, please call your pharmacy. ? ?

## 2021-08-28 ENCOUNTER — Encounter (HOSPITAL_COMMUNITY): Payer: Self-pay

## 2021-08-28 ENCOUNTER — Encounter (HOSPITAL_COMMUNITY)
Admission: RE | Admit: 2021-08-28 | Discharge: 2021-08-28 | Disposition: A | Discharge status: Home / Self Care | Payer: Medicaid Other | Source: Ambulatory Visit | Attending: Cardiology | Admitting: Cardiology

## 2021-08-28 ENCOUNTER — Other Ambulatory Visit: Payer: Self-pay

## 2021-08-28 ENCOUNTER — Ambulatory Visit (HOSPITAL_COMMUNITY)
Admission: RE | Admit: 2021-08-28 | Discharge: 2021-08-28 | Disposition: A | Discharge status: Home / Self Care | Payer: Medicaid Other | Source: Ambulatory Visit | Attending: Cardiology | Admitting: Cardiology

## 2021-08-28 DIAGNOSIS — R079 Chest pain, unspecified: Secondary | ICD-10-CM

## 2021-08-28 LAB — NM MYOCAR MULTI W/SPECT W/WALL MOTION / EF
LV dias vol: 154 mL (ref 62–150)
LV sys vol: 79 mL
Nuc Stress EF: 49 %
Peak HR: 96 {beats}/min
RATE: 0.3
Rest HR: 75 {beats}/min
Rest Nuclear Isotope Dose: 11 mCi
SDS: 1
SRS: 0
SSS: 1
ST Depression (mm): 0 mm
Stress Nuclear Isotope Dose: 31.2 mCi
TID: 1.08

## 2021-08-28 MED ORDER — REGADENOSON 0.4 MG/5ML IV SOLN
INTRAVENOUS | Status: AC
  Administered 2021-08-28: 0.4 mg via INTRAVENOUS
  Filled 2021-08-28: qty 5

## 2021-08-28 MED ORDER — TECHNETIUM TC 99M TETROFOSMIN IV KIT
30.0000 | PACK | Freq: Once | INTRAVENOUS | Status: AC | PRN
  Administered 2021-08-28: 31.2 via INTRAVENOUS

## 2021-08-28 MED ORDER — TECHNETIUM TC 99M TETROFOSMIN IV KIT
10.0000 | PACK | Freq: Once | INTRAVENOUS | Status: AC | PRN
  Administered 2021-08-28: 11 via INTRAVENOUS

## 2021-08-28 MED ORDER — SODIUM CHLORIDE FLUSH 0.9 % IV SOLN
INTRAVENOUS | Status: AC
  Administered 2021-08-28: 10 mL via INTRAVENOUS
  Filled 2021-08-28: qty 10

## 2021-08-29 ENCOUNTER — Telehealth: Payer: Self-pay | Admitting: *Deleted

## 2021-08-29 MED ORDER — METOPROLOL SUCCINATE ER 25 MG PO TB24: 25.0000 mg | ORAL_TABLET | Freq: Every day | ORAL | 1 refills | Status: DC

## 2021-08-29 NOTE — Telephone Encounter (Signed)
-----   Message from Satira Sark, MD sent at 08/28/2021 12:48 PM EST ----- ?Results reviewed.  Myoview showed no clear evidence of ischemia to suggest definitive obstructive CAD.  Occasional PVCs noted during the study.  Calculated LVEF was 49%, would clarify with echocardiogram which has already been ordered.  Suggest adding Toprol-XL 25 mg daily to current regimen.  Schedule office follow-up in the next few months. ?

## 2021-08-29 NOTE — Telephone Encounter (Signed)
Patient informed and verbalized understanding of plan. Copy sent to PCP 

## 2021-09-04 ENCOUNTER — Ambulatory Visit (INDEPENDENT_AMBULATORY_CARE_PROVIDER_SITE_OTHER): Payer: Self-pay

## 2021-09-04 ENCOUNTER — Other Ambulatory Visit: Payer: Self-pay

## 2021-09-04 DIAGNOSIS — R0602 Shortness of breath: Secondary | ICD-10-CM

## 2021-09-04 LAB — ECHOCARDIOGRAM COMPLETE
AR max vel: 3.01 cm2
AV Area VTI: 3.1 cm2
AV Area mean vel: 3.23 cm2
AV Mean grad: 5 mmHg
AV Peak grad: 10.4 mmHg
Ao pk vel: 1.62 m/s
Area-P 1/2: 3.17 cm2
Calc EF: 68 %
S' Lateral: 3.66 cm
Single Plane A2C EF: 64.4 %
Single Plane A4C EF: 70.3 %

## 2021-09-10 ENCOUNTER — Encounter: Payer: Self-pay | Admitting: Physician Assistant

## 2021-09-10 ENCOUNTER — Other Ambulatory Visit: Payer: Self-pay

## 2021-09-10 ENCOUNTER — Ambulatory Visit: Payer: Medicaid Other | Admitting: Physician Assistant

## 2021-09-10 VITALS — BP 121/73 | HR 100 | Temp 97.2°F | Wt 272.0 lb

## 2021-09-10 DIAGNOSIS — I10 Essential (primary) hypertension: Secondary | ICD-10-CM

## 2021-09-10 DIAGNOSIS — I739 Peripheral vascular disease, unspecified: Secondary | ICD-10-CM

## 2021-09-10 DIAGNOSIS — R7989 Other specified abnormal findings of blood chemistry: Secondary | ICD-10-CM

## 2021-09-10 DIAGNOSIS — J449 Chronic obstructive pulmonary disease, unspecified: Secondary | ICD-10-CM

## 2021-09-10 DIAGNOSIS — E785 Hyperlipidemia, unspecified: Secondary | ICD-10-CM

## 2021-09-10 DIAGNOSIS — Z720 Tobacco use: Secondary | ICD-10-CM

## 2021-09-10 MED ORDER — METOPROLOL TARTRATE 25 MG PO TABS: 25.0000 mg | ORAL_TABLET | Freq: Two times a day (BID) | ORAL | 1 refills | Status: DC

## 2021-09-10 NOTE — Progress Notes (Signed)
? ?BP 121/73   Pulse 100   Temp (!) 97.2 ?F (36.2 ?C)   Wt 272 lb (123.4 kg)   SpO2 98%   BMI 31.43 kg/m?   ? ?Subjective:  ? ? Patient ID: Timothy Nielsen, male    DOB: 20-Jun-1971, 51 y.o.   MRN: [id number] ? ?HPI: ?Timothy Nielsen is a 51 y.o. male presenting on 09/10/2021 for Follow-up ? ? ?HPI ? ?Pt is 50yoM with appointment to follow up.   He has HTN, dyslipidemia, COPD,  cervical radiculopathy, neuropathy, tobacco use disorder. ? ?Pt seen by cardiology earlier this month for CP.  Lexiscan and echo were ordered.  He has appointment to follow up with cardiology in June.   He also was referred to vascular surgery and has appointment tomorrow. ? ? ?He is still Smoking "some" ?He is still drinking etoh; he had 6 on Saturday night ? ?He hasn't used his NTG in about a week ? ?He says his Breathing is okay.  He says he Gets sob with moving around. ? ? ? ?Relevant past medical, surgical, family and social history reviewed and updated as indicated. Interim medical history since our last visit reviewed. ?Allergies and medications reviewed and updated. ? ? ?Current Outpatient Medications:  ?  albuterol (VENTOLIN HFA) 108 (90 Base) MCG/ACT inhaler, INHALE 2 PUFFS BY MOUTH EVERY 6 HOURS AS NEEDED FOR COUGHING, WHEEZING, OR SHORTNESS OF BREATH, Disp: 20.1 g, Rfl: 0 ?  aspirin EC 81 MG tablet, Take 81 mg by mouth daily. Swallow whole., Disp: , Rfl:  ?  atorvastatin (LIPITOR) 40 MG tablet, Take 1 tablet (40 mg total) by mouth daily., Disp: 90 tablet, Rfl: 1 ?  fluticasone furoate-vilanterol (BREO ELLIPTA) 100-25 MCG/ACT AEPB, Inhale 1 puff into the lungs daily., Disp: 3 each, Rfl: 3 ?  lisinopril-hydrochlorothiazide (ZESTORETIC) 20-12.5 MG tablet, TAKE 1 Tablet BY MOUTH ONCE EVERY DAY, Disp: 90 tablet, Rfl: 0 ?  metoprolol succinate (TOPROL XL) 25 MG 24 hr tablet, Take 1 tablet (25 mg total) by mouth daily., Disp: 90 tablet, Rfl: 1 ?  omeprazole (PRILOSEC) 40 MG capsule, TAKE 1 Capsule BY MOUTH ONCE EVERY DAY, Disp: 90 capsule,  Rfl: 0 ?  cyanocobalamin (,VITAMIN B-12,) 1000 MCG/ML injection, Inject 1 ml intramuscularly daily for 7 days, then inject 1 ml weekly for 4 weeks, then 1 ml monthly thereafter for 1 year. (Patient not taking: Reported on 07/11/2021), Disp: 10 mL, Rfl: 2 ?  nitroGLYCERIN (NITROSTAT) 0.4 MG SL tablet, Place 1 tablet (0.4 mg total) under the tongue every 5 (five) minutes as needed for chest pain. (Patient not taking: Reported on 09/10/2021), Disp: 25 tablet, Rfl: 3 ? ? ? ?Review of Systems ? ?Per HPI unless specifically indicated above ? ?   ?Objective:  ?  ?BP 121/73   Pulse 100   Temp (!) 97.2 ?F (36.2 ?C)   Wt 272 lb (123.4 kg)   SpO2 98%   BMI 31.43 kg/m?   ?Wt Readings from Last 3 Encounters:  ?09/10/21 272 lb (123.4 kg)  ?08/22/21 269 lb 9.6 oz (122.3 kg)  ?07/11/21 268 lb (121.6 kg)  ?  ?Physical Exam ?Vitals reviewed.  ?Constitutional:   ?   General: He is not in acute distress. ?   Appearance: He is well-developed. He is not toxic-appearing.  ?HENT:  ?   Head: Normocephalic and atraumatic.  ?Cardiovascular:  ?   Rate and Rhythm: Normal rate and regular rhythm.  ?Pulmonary:  ?   Effort: Pulmonary effort is normal.  ?  Breath sounds: No wheezing or rhonchi.  ?Abdominal:  ?   General: Bowel sounds are normal.  ?   Palpations: Abdomen is soft.  ?   Tenderness: There is no abdominal tenderness.  ?Musculoskeletal:  ?   Cervical back: Neck supple.  ?   Right lower leg: No edema.  ?   Left lower leg: No edema.  ?Lymphadenopathy:  ?   Cervical: No cervical adenopathy.  ?Skin: ?   General: Skin is warm and dry.  ?Neurological:  ?   Mental Status: He is alert and oriented to person, place, and time.  ?Psychiatric:     ?   Attention and Perception: Attention normal.     ?   Behavior: Behavior normal. Behavior is cooperative.  ? ? ? ? ? ? ?   ?Assessment & Plan:  ? ? ? ?Encounter Diagnoses  ?Name Primary?  ? Primary hypertension Yes  ? Tobacco use   ? Chronic obstructive pulmonary disease, unspecified COPD type (HCC)    ? PAD (peripheral artery disease) (HCC)   ? Hyperlipidemia, unspecified hyperlipidemia type   ? Elevated LFTs   ? ? ? ? ?1.Copd ?2. Tobacco use disorder   ?-counseled on smoking cessation.  Pt unable to tolerate patches due to skin irritation.  Recommended he try other replacement options like nicotine gum or lozenges.  Discussed chantix but it is cost prohibitive.  Pt was given handouts on local smoking cessation class. ?3. Dyslipidemia- atorvastatin was increased by cardiology ?4. CP- seen for this by cardiology March 2023 with reassuring lexiscan and echo ?5. Genella Rife ?6.  Elevated LFTs-  pt encouraged to avoid etoh ?7.  Prediabetes ?8.  Neuropathy- seen for this by neurology in November 2022 ?9.  Cervical Radiculopathy- seen for this by neurology in November 2022 ?10.  PAD- encouraged smoking cessation.  Pt has appointment with vascular surgery tomorrow ?11. HTN ? ? ?-Rx metoprolol sent to [facility] as they do not have toprol xl which was prescribed by cardiology ?-pt to follow up 2 months.  He is to contact office sooner prn ? ? ?

## 2021-09-11 ENCOUNTER — Encounter: Payer: Self-pay | Admitting: Cardiovascular Disease

## 2021-09-11 ENCOUNTER — Telehealth: Payer: Self-pay | Admitting: Licensed Clinical Social Worker

## 2021-09-11 ENCOUNTER — Other Ambulatory Visit: Payer: Self-pay

## 2021-09-11 ENCOUNTER — Ambulatory Visit (INDEPENDENT_AMBULATORY_CARE_PROVIDER_SITE_OTHER): Payer: Self-pay | Admitting: Cardiovascular Disease

## 2021-09-11 VITALS — BP 110/78 | HR 93 | Ht 78.0 in | Wt 267.0 lb

## 2021-09-11 DIAGNOSIS — I739 Peripheral vascular disease, unspecified: Secondary | ICD-10-CM | POA: Insufficient documentation

## 2021-09-11 NOTE — Telephone Encounter (Signed)
LCSW noted that pt CAFA expires on 09/12/21, he has ongoing medical work up needed. LCSW reached out to Burnard Bunting, w/ [facility] [place]. She shares that pt should make an appt with their team to renew his CAFA and any additional applications that may need renewal. I have mailed pt [facility] flyer with this update along with my card for any additional questions. I remain available.  ? ?Octavio Graves, MSW, LCSW ?Clinical Social Worker II ?[place] [facility]  ?[phone]- work cell phone (preferred) ?[phone]- desk phone ? ?

## 2021-09-11 NOTE — Progress Notes (Signed)
? ? ? ?09/11/2021 ?Timothy Nielsen   ?[birth year]  ?[id number] ? ?Primary Physician Timothy Hawking, PA-C ?Primary Cardiologist: Runell Gess MD Timothy Nielsen, [place] ? ?HPI:  Timothy Nielsen is a 51 y.o. mildly overweight divorced Caucasian male with no children who is currently unemployed but previously worked for a tree company.  He was referred through the courtesy of Dr. Diona Browner, his cardiologist, for evaluation of lifestyle-limiting claudication.  His risk factors include 35 pack years of tobacco abuse currently smoking 1/2 pack/day with the intent to quit.  He has treated hypertension and hyperlipidemia.  Both his parents had coronary intervention.  He is never had a heart attack or stroke.  He does complain of shortness of breath and occasional chest tightness.  He has COPD.  Recent had a 2D echo that was normal and a Myoview stress test that was nonischemic.  He has had lifestyle-limiting claudication for at least a year right greater than left.  He had Doppler studies performed at [facility] 07/26/2021 revealing right ABI of 0.69 and a left of 1.16. ? ? ?No outpatient medications have been marked as taking for the 09/11/21 encounter (Office Visit) with Runell Gess, MD.  ?  ? ?No Known Allergies ? ?Social History  ? ?Socioeconomic History  ? Marital status: Single  ?  Spouse name: Not on file  ? Number of children: 0  ? Years of education: Not on file  ? Highest education level: Not on file  ?Occupational History  ? Not on file  ?Tobacco Use  ? Smoking status: Some Days  ?  Packs/day: 0.25  ?  Years: 15.00  ?  Pack years: 3.75  ?  Types: Cigarettes  ?  Start date: 11/14/1986  ? Smokeless tobacco: Never  ? Tobacco comments:  ?  Patient using patch to quit smoking. Down to about 4 cigarettes a day  ?Vaping Use  ? Vaping Use: Never used  ?Substance and Sexual Activity  ? Alcohol use: Yes  ?  Alcohol/week: 18.0 standard drinks  ?  Types: 18 Cans of beer per week  ?  Comment: 12 pack a week  ? Drug use:  No  ? Sexual activity: Not on file  ?Other Topics Concern  ? Not on file  ?Social History Narrative  ? Right Handed   ? Lives in a one story home   ? ?Social Determinants of Health  ? ?Financial Resource Strain: Not on file  ?Food Insecurity: Not on file  ?Transportation Needs: Not on file  ?Physical Activity: Not on file  ?Stress: Not on file  ?Social Connections: Not on file  ?Intimate Partner Violence: Not on file  ?  ? ?Review of Systems: ?General: negative for chills, fever, night sweats or weight changes.  ?Cardiovascular: negative for chest pain, dyspnea on exertion, edema, orthopnea, palpitations, paroxysmal nocturnal dyspnea or shortness of breath ?Dermatological: negative for rash ?Respiratory: negative for cough or wheezing ?Urologic: negative for hematuria ?Abdominal: negative for nausea, vomiting, diarrhea, bright red blood per rectum, melena, or hematemesis ?Neurologic: negative for visual changes, syncope, or dizziness ?All other systems reviewed and are otherwise negative except as noted above. ? ? ? ?Blood pressure 110/78, pulse 93, height 6\' 6"  (1.981 m), weight 267 lb (121.1 kg), SpO2 98 %.  ?General appearance: alert and no distress ?Neck: no adenopathy, no carotid bruit, no JVD, supple, symmetrical, trachea midline, and thyroid not enlarged, symmetric, no tenderness/mass/nodules ?Lungs: clear to auscultation bilaterally ?Heart: regular rate and rhythm, S1,  S2 normal, no murmur, click, rub or gallop ?Extremities: extremities normal, atraumatic, no cyanosis or edema ?Pulses: 2+ and symmetric ?Skin: Skin color, texture, turgor normal. No rashes or lesions ?Neurologic: Grossly normal ? ?EKG not performed today ? ?ASSESSMENT AND PLAN:  ? ?Peripheral arterial disease (HCC) ?[name] was referred to me by Dr. Diona Browner for evaluation of lifestyle limiting claudication.  He has multiple risk factors including tobacco abuse, hypertension and hyperlipidemia as well as family history of heart disease.  He  has had lifestyle-limiting claudication for the last year right greater than left.  He did have ABIs performed at the hospital 07/26/2021 revealing right ABI of 0.69 and a left of 1.16.  I am going to get lower extremity arterial Doppler studies in anticipation of performing angiography and potential endovascular therapy. ? ? ? ? ?Runell Gess MD FACP,FACC,FAHA, FSCAI ?09/11/2021 ?8:40 AM ?

## 2021-09-11 NOTE — Patient Instructions (Signed)
Medication Instructions:  ?Your physician recommends that you continue on your current medications as directed. Please refer to the Current Medication list given to you today. ? ?*If you need a refill on your cardiac medications before your next appointment, please call your pharmacy* ? ? ?Testing/Procedures: ?Your physician has requested that you have a lower extremity arterial duplex. This test is an ultrasound of the arteries in the legs. It looks at arterial blood flow in the legs. Allow one hour for Lower Arterial scans. There are no restrictions or special instructions ? ?Your physician has requested that you have an ankle brachial index (ABI). During this test an ultrasound and blood pressure cuff are used to evaluate the arteries that supply the arms and legs with blood. Allow thirty minutes for this exam. There are no restrictions or special instructions. ?These procedures will be done at [address] [facility] ? ? ?Follow-Up: ?At [facility], you and your health needs are our priority.  As part of our continuing mission to provide you with exceptional heart care, we have created designated Provider Care Teams.  These Care Teams include your primary Cardiologist (physician) and Advanced Practice Providers (APPs -  Physician Assistants and Nurse Practitioners) who all work together to provide you with the care you need, when you need it. ? ?We recommend signing up for the patient portal called "MyChart".  Sign up information is provided on this After Visit Summary.  MyChart is used to connect with patients for Virtual Visits (Telemedicine).  Patients are able to view lab/test results, encounter notes, upcoming appointments, etc.  Non-urgent messages can be sent to your provider as well.   ?To learn more about what you can do with MyChart, go to [email or web address].   ? ?Your next appointment:   ?To be determined ? ?Provider:   ?[name], MD ?

## 2021-09-11 NOTE — Assessment & Plan Note (Signed)
Mr. Pepperman was referred to me by Dr. Diona Browner for evaluation of lifestyle limiting claudication.  He has multiple risk factors including tobacco abuse, hypertension and hyperlipidemia as well as family history of heart disease.  He has had lifestyle-limiting claudication for the last year right greater than left.  He did have ABIs performed at the hospital 07/26/2021 revealing right ABI of 0.69 and a left of 1.16.  I am going to get lower extremity arterial Doppler studies in anticipation of performing angiography and potential endovascular therapy. ?

## 2021-09-12 ENCOUNTER — Encounter (HOSPITAL_COMMUNITY): Payer: Medicaid Other

## 2021-09-13 ENCOUNTER — Telehealth: Payer: Self-pay | Admitting: Licensed Clinical Social Worker

## 2021-09-13 NOTE — Telephone Encounter (Signed)
LCSW attempted to reach pt via telephone to provide numbers mailed in order to re-enroll in [facility]. No answer at [phone], message left requesting call back. Will re-attempt again as able.  ? ?Octavio Graves, MSW, LCSW ?Clinical Social Worker II ?[place] [facility]  ?[phone]- work cell phone (preferred) ?[phone]- desk phone ? ?

## 2021-09-13 NOTE — Telephone Encounter (Signed)
Pt returned my call. Confirms he had an appt w/ [facility] this morning. He is waiting for bills from his most recent visit/upcoming procedure to complete application with their team for [employer]. Pt also in the process of applying for disability through [facility]. Encouraged pt to call me or the office if any questions/concerns arise. Pt appreciative for follow up.  ? ?Octavio Graves, MSW, LCSW ?Clinical Social Worker II ?[place] [facility]  ?[phone]- work cell phone (preferred) ?[phone]- desk phone ? ?

## 2021-10-01 ENCOUNTER — Telehealth: Payer: Self-pay | Admitting: Cardiology

## 2021-10-01 NOTE — Telephone Encounter (Signed)
Spoke to patient he was calling to find out about lower ext dopplers scheduled 4/13 at 1:00 pm.Dopplers explained to patient. ?

## 2021-10-01 NOTE — Telephone Encounter (Signed)
Patient calling with question/concerns for his procedure on Thursday. Please advise ?

## 2021-10-04 ENCOUNTER — Ambulatory Visit (HOSPITAL_COMMUNITY)
Admission: RE | Admit: 2021-10-04 | Discharge: 2021-10-04 | Disposition: A | Discharge status: Home / Self Care | Payer: Self-pay | Source: Ambulatory Visit | Attending: Internal Medicine | Admitting: Internal Medicine

## 2021-10-04 DIAGNOSIS — I739 Peripheral vascular disease, unspecified: Secondary | ICD-10-CM

## 2021-10-11 ENCOUNTER — Telehealth: Payer: Self-pay | Admitting: Neurology

## 2021-10-11 NOTE — Telephone Encounter (Signed)
I do not see where a surgeon was recommenced. Last procedure note stated "consider referral to PM&R for injections" ?

## 2021-10-11 NOTE — Telephone Encounter (Signed)
Pt called in stating Dr. Allena Katz said to try physical therapy first for his pinched nerve in his neck. If that didn't work then she may have to refer him to a Careers adviser. The PT worked for a while, but now the pain is coming back and it's radiating down to his elbow. He would like to see if he could get a referral for a surgeon? ?

## 2021-10-15 ENCOUNTER — Other Ambulatory Visit: Payer: Self-pay | Admitting: Physician Assistant

## 2021-10-15 DIAGNOSIS — I10 Essential (primary) hypertension: Secondary | ICD-10-CM

## 2021-10-17 ENCOUNTER — Encounter: Payer: Self-pay | Admitting: Cardiovascular Disease

## 2021-10-17 ENCOUNTER — Ambulatory Visit (INDEPENDENT_AMBULATORY_CARE_PROVIDER_SITE_OTHER): Payer: Self-pay | Admitting: Cardiovascular Disease

## 2021-10-17 ENCOUNTER — Other Ambulatory Visit: Payer: Self-pay

## 2021-10-17 DIAGNOSIS — I739 Peripheral vascular disease, unspecified: Secondary | ICD-10-CM

## 2021-10-17 MED ORDER — SODIUM CHLORIDE 0.9% FLUSH: 3.0000 mL | Freq: Two times a day (BID) | INTRAVENOUS | Status: DC

## 2021-10-17 NOTE — Patient Instructions (Signed)
Medication Instructions:  ?Your physician recommends that you continue on your current medications as directed. Please refer to the Current Medication list given to you today. ? ?*If you need a refill on your cardiac medications before your next appointment, please call your pharmacy* ? ? ?Lab Work: ?Your physician recommends that you have labs drawn today: BMET & CBC ? ?If you have labs (blood work) drawn today and your tests are completely normal, you will receive your results only by: ?MyChart Message (if you have MyChart) OR ?A paper copy in the mail ?If you have any lab test that is abnormal or we need to change your treatment, we will call you to review the results. ? ? ?Testing/Procedures: ?Your physician has requested that you have a lower extremity arterial duplex. This test is an ultrasound of the arteries in the legs. It looks at arterial blood flow in the legs. Allow one hour for Lower Arterial scans. There are no restrictions or special instructions ? ?Your physician has requested that you have an ankle brachial index (ABI). During this test an ultrasound and blood pressure cuff are used to evaluate the arteries that supply the arms and legs with blood. Allow thirty minutes for this exam. There are no restrictions or special instructions. ?To be done 1-2 weeks after PV procedure. These will be done at [address] [facility] ? ? ?Follow-Up: ?At [facility], you and your health needs are our priority.  As part of our continuing mission to provide you with exceptional heart care, we have created designated Provider Care Teams.  These Care Teams include your primary Cardiologist (physician) and Advanced Practice Providers (APPs -  Physician Assistants and Nurse Practitioners) who all work together to provide you with the care you need, when you need it. ? ?We recommend signing up for the patient portal called "MyChart".  Sign up information is provided on this After Visit Summary.  MyChart is used  to connect with patients for Virtual Visits (Telemedicine).  Patients are able to view lab/test results, encounter notes, upcoming appointments, etc.  Non-urgent messages can be sent to your provider as well.   ?To learn more about what you can do with MyChart, go to [email or web address].   ? ?Your next appointment:   ?2-3 week(s) ? ?The format for your next appointment:   ?In Person ? ?Provider:   ?[name], MD ? ? ?Other Instructions ? ?[facility] ?[facility] ?[address] ?[place] [address] ?Dept: [phone] ?Loc: [phone] ? ?Timothy Nielsen  10/17/2021 ? ?You are scheduled for a Peripheral Angiogram on Monday, May 8 with Dr. [name]. ? ?1. Please arrive at the Main Entrance A at [facility]: [address] [place], [place] [address] at 5:30 AM (This time is two hours before your procedure to ensure your preparation). Free valet parking service is available.  ? ?Special note: Every effort is made to have your procedure done on time. Please understand that emergencies sometimes delay scheduled procedures. ? ?2. Diet: Do not eat solid foods after midnight.  You may have clear liquids until 5 AM upon the day of the procedure. ? ?3. Labs: You will need to have blood drawn today. ? ? ?4. Medication instructions in preparation for your procedure: ? ?Stop taking, HTCZ (Hydrochlorothiazide) Monday, May 8, ? ? ?On the morning of your procedure, take Aspirin and any morning medicines NOT listed above.  You may use sips of water. ? ?  5. Plan to go home the same day, you will only stay overnight if medically necessary. ?6. You MUST have a responsible adult to drive you home. ?7. An adult MUST be with you the first 24 hours after you arrive home. ?8. Bring a current list of your medications, and the last time and date medication taken. ?9. Bring ID and current insurance cards. ?10.Please wear clothes that are easy to get  on and off and wear slip-on shoes. ? ?Thank you for allowing [place] to care for you! ?  -- [facility]  ?

## 2021-10-17 NOTE — Progress Notes (Signed)
? ? ? ?10/17/2021 ?Timothy Nielsen   ?04/28/1971  ?[id number] ? ?Primary Physician McElroy, Shannon, PA-C ?Primary Cardiologist: Ashleymarie Granderson J Lurene Robley MD FACP, FACC, FAHA, FSCAI ? ?HPI:  Timothy Nielsen is a 50 y.o.  mildly overweight divorced Caucasian male with no children who is currently unemployed but previously worked for a [employer].  He was referred through the courtesy of Dr. McDowell, his cardiologist, for evaluation of lifestyle-limiting claudication.  I last saw him in the office 09/11/2021.  His risk factors include 35 pack years of tobacco abuse currently smoking 1/2 pack/day with the intent to quit.  He has treated hypertension and hyperlipidemia.  Both his parents had coronary intervention.  He is never had a heart attack or stroke.  He does complain of shortness of breath and occasional chest tightness.  He has COPD.  Recent had a 2D echo that was normal and a Myoview stress test that was nonischemic.  He has had lifestyle-limiting claudication for at least a year right greater than left.  He had Doppler studies performed at [facility] 07/26/2021 revealing right ABI of 0.69 and a left of 1.16. ? ?I performed lower extreme arterial Doppler studies on him 10/04/2021 revealing a right ABI of 0.65 with an occluded right SFA.  He does have lifestyle limiting claudication and wishes to proceed with outpatient peripheral vascular angiography and endovascular therapy.  He is smoking but is trying to quit. ? ? ?Current Meds  ?Medication Sig  ? albuterol (VENTOLIN HFA) 108 (90 Base) MCG/ACT inhaler INHALE 2 PUFFS BY MOUTH EVERY 6 HOURS AS NEEDED FOR COUGHING, WHEEZING, OR SHORTNESS OF BREATH  ? aspirin EC 81 MG tablet Take 81 mg by mouth daily. Swallow whole.  ? atorvastatin (LIPITOR) 40 MG tablet Take 1 tablet (40 mg total) by mouth daily.  ? cyanocobalamin (,VITAMIN B-12,) 1000 MCG/ML injection Inject 1 ml intramuscularly daily for 7 days, then inject 1 ml weekly for 4 weeks, then 1 ml monthly thereafter for 1  year.  ? fluticasone furoate-vilanterol (BREO ELLIPTA) 100-25 MCG/ACT AEPB Inhale 1 puff into the lungs daily.  ? lisinopril-hydrochlorothiazide (ZESTORETIC) 20-12.5 MG tablet TAKE 1 Tablet BY MOUTH ONCE EVERY DAY  ? metoprolol tartrate (LOPRESSOR) 25 MG tablet Take 1 tablet (25 mg total) by mouth 2 (two) times daily.  ? nitroGLYCERIN (NITROSTAT) 0.4 MG SL tablet Place 1 tablet (0.4 mg total) under the tongue every 5 (five) minutes as needed for chest pain.  ? omeprazole (PRILOSEC) 40 MG capsule TAKE 1 Capsule BY MOUTH ONCE EVERY DAY  ?  ? ?No Known Allergies ? ?Social History  ? ?Socioeconomic History  ? Marital status: Single  ?  Spouse name: Not on file  ? Number of children: 0  ? Years of education: Not on file  ? Highest education level: Not on file  ?Occupational History  ? Not on file  ?Tobacco Use  ? Smoking status: Some Days  ?  Packs/day: 0.25  ?  Years: 15.00  ?  Pack years: 3.75  ?  Types: Cigarettes  ?  Start date: 11/14/1986  ? Smokeless tobacco: Never  ? Tobacco comments:  ?  Patient using patch to quit smoking. Down to about 4 cigarettes a day  ?Vaping Use  ? Vaping Use: Never used  ?Substance and Sexual Activity  ? Alcohol use: Yes  ?  Alcohol/week: 18.0 standard drinks  ?  Types: 18 Cans of beer per week  ?  Comment: 12 pack a week  ? Drug   use: No  ? Sexual activity: Not on file  ?Other Topics Concern  ? Not on file  ?Social History Narrative  ? Right Handed   ? Lives in a one story home   ? ?Social Determinants of Health  ? ?Financial Resource Strain: Not on file  ?Food Insecurity: Not on file  ?Transportation Needs: Not on file  ?Physical Activity: Not on file  ?Stress: Not on file  ?Social Connections: Not on file  ?Intimate Partner Violence: Not on file  ?  ? ?Review of Systems: ?General: negative for chills, fever, night sweats or weight changes.  ?Cardiovascular: negative for chest pain, dyspnea on exertion, edema, orthopnea, palpitations, paroxysmal nocturnal dyspnea or shortness of  breath ?Dermatological: negative for rash ?Respiratory: negative for cough or wheezing ?Urologic: negative for hematuria ?Abdominal: negative for nausea, vomiting, diarrhea, bright red blood per rectum, melena, or hematemesis ?Neurologic: negative for visual changes, syncope, or dizziness ?All other systems reviewed and are otherwise negative except as noted above. ? ? ? ?Blood pressure 102/70, pulse 79, height 6' 6" (1.981 m), weight 267 lb (121.1 kg).  ?General appearance: alert and no distress ?Neck: no adenopathy, no carotid bruit, no JVD, supple, symmetrical, trachea midline, and thyroid not enlarged, symmetric, no tenderness/mass/nodules ?Lungs: clear to auscultation bilaterally ?Heart: regular rate and rhythm, S1, S2 normal, no murmur, click, rub or gallop ?Extremities: extremities normal, atraumatic, no cyanosis or edema ?Pulses: Absent right pedal pulse ?Skin: Skin color, texture, turgor normal. No rashes or lesions ?Neurologic: Grossly normal ? ?EKG sinus rhythm at 79 with incomplete right bundle branch block.  I personally reviewed this EKG. ? ?ASSESSMENT AND PLAN:  ? ?Peripheral arterial disease (HCC) ?[name] returns today for follow-up of his Doppler studies performed because of right lower extremity claudication.  This revealed a right ABI of 0.65 with an occluded right SFA.  He wishes to proceed with outpatient peripheral angiography and endovascular therapy for lifestyle-limiting claudication. ? ? ? ? ?Shritha Bresee J. Estevon Fluke MD FACP,FACC,FAHA, FSCAI ?10/17/2021 ?10:54 AM ?

## 2021-10-17 NOTE — Assessment & Plan Note (Signed)
Timothy Nielsen returns today for follow-up of his Doppler studies performed because of right lower extremity claudication.  This revealed a right ABI of 0.65 with an occluded right SFA.  He wishes to proceed with outpatient peripheral angiography and endovascular therapy for lifestyle-limiting claudication. ?

## 2021-10-17 NOTE — H&P (View-Only) (Signed)
? ? ? ?10/17/2021 ?Timothy Nielsen   ?12/19/1970  ?[id number] ? ?Primary Physician Soyla Dryer, PA-C ?Primary Cardiologist: Lorretta Harp MD [name], [place] ? ?HPI:  Timothy Nielsen is a 51 y.o.  mildly overweight divorced Caucasian male with no children who is currently unemployed but previously worked for a [employer].  He was referred through the courtesy of Dr. Domenic Polite, his cardiologist, for evaluation of lifestyle-limiting claudication.  I last saw him in the office 09/11/2021.  His risk factors include 35 pack years of tobacco abuse currently smoking 1/2 pack/day with the intent to quit.  He has treated hypertension and hyperlipidemia.  Both his parents had coronary intervention.  He is never had a heart attack or stroke.  He does complain of shortness of breath and occasional chest tightness.  He has COPD.  Recent had a 2D echo that was normal and a Myoview stress test that was nonischemic.  He has had lifestyle-limiting claudication for at least a year right greater than left.  He had Doppler studies performed at [facility] 07/26/2021 revealing right ABI of 0.69 and a left of 1.16. ? ?I performed lower extreme arterial Doppler studies on him 10/04/2021 revealing a right ABI of 0.65 with an occluded right SFA.  He does have lifestyle limiting claudication and wishes to proceed with outpatient peripheral vascular angiography and endovascular therapy.  He is smoking but is trying to quit. ? ? ?Current Meds  ?Medication Sig  ? albuterol (VENTOLIN HFA) 108 (90 Base) MCG/ACT inhaler INHALE 2 PUFFS BY MOUTH EVERY 6 HOURS AS NEEDED FOR COUGHING, WHEEZING, OR SHORTNESS OF BREATH  ? aspirin EC 81 MG tablet Take 81 mg by mouth daily. Swallow whole.  ? atorvastatin (LIPITOR) 40 MG tablet Take 1 tablet (40 mg total) by mouth daily.  ? cyanocobalamin (,VITAMIN B-12,) 1000 MCG/ML injection Inject 1 ml intramuscularly daily for 7 days, then inject 1 ml weekly for 4 weeks, then 1 ml monthly thereafter for 1  year.  ? fluticasone furoate-vilanterol (BREO ELLIPTA) 100-25 MCG/ACT AEPB Inhale 1 puff into the lungs daily.  ? lisinopril-hydrochlorothiazide (ZESTORETIC) 20-12.5 MG tablet TAKE 1 Tablet BY MOUTH ONCE EVERY DAY  ? metoprolol tartrate (LOPRESSOR) 25 MG tablet Take 1 tablet (25 mg total) by mouth 2 (two) times daily.  ? nitroGLYCERIN (NITROSTAT) 0.4 MG SL tablet Place 1 tablet (0.4 mg total) under the tongue every 5 (five) minutes as needed for chest pain.  ? omeprazole (PRILOSEC) 40 MG capsule TAKE 1 Capsule BY MOUTH ONCE EVERY DAY  ?  ? ?No Known Allergies ? ?Social History  ? ?Socioeconomic History  ? Marital status: Single  ?  Spouse name: Not on file  ? Number of children: 0  ? Years of education: Not on file  ? Highest education level: Not on file  ?Occupational History  ? Not on file  ?Tobacco Use  ? Smoking status: Some Days  ?  Packs/day: 0.25  ?  Years: 15.00  ?  Pack years: 3.75  ?  Types: Cigarettes  ?  Start date: 11/14/1986  ? Smokeless tobacco: Never  ? Tobacco comments:  ?  Patient using patch to quit smoking. Down to about 4 cigarettes a day  ?Vaping Use  ? Vaping Use: Never used  ?Substance and Sexual Activity  ? Alcohol use: Yes  ?  Alcohol/week: 18.0 standard drinks  ?  Types: 18 Cans of beer per week  ?  Comment: 12 pack a week  ? Drug  use: No  ? Sexual activity: Not on file  ?Other Topics Concern  ? Not on file  ?Social History Narrative  ? Right Handed   ? Lives in a one story home   ? ?Social Determinants of Health  ? ?Financial Resource Strain: Not on file  ?Food Insecurity: Not on file  ?Transportation Needs: Not on file  ?Physical Activity: Not on file  ?Stress: Not on file  ?Social Connections: Not on file  ?Intimate Partner Violence: Not on file  ?  ? ?Review of Systems: ?General: negative for chills, fever, night sweats or weight changes.  ?Cardiovascular: negative for chest pain, dyspnea on exertion, edema, orthopnea, palpitations, paroxysmal nocturnal dyspnea or shortness of  breath ?Dermatological: negative for rash ?Respiratory: negative for cough or wheezing ?Urologic: negative for hematuria ?Abdominal: negative for nausea, vomiting, diarrhea, bright red blood per rectum, melena, or hematemesis ?Neurologic: negative for visual changes, syncope, or dizziness ?All other systems reviewed and are otherwise negative except as noted above. ? ? ? ?Blood pressure 102/70, pulse 79, height 6\' 6"  (1.981 m), weight 267 lb (121.1 kg).  ?General appearance: alert and no distress ?Neck: no adenopathy, no carotid bruit, no JVD, supple, symmetrical, trachea midline, and thyroid not enlarged, symmetric, no tenderness/mass/nodules ?Lungs: clear to auscultation bilaterally ?Heart: regular rate and rhythm, S1, S2 normal, no murmur, click, rub or gallop ?Extremities: extremities normal, atraumatic, no cyanosis or edema ?Pulses: Absent right pedal pulse ?Skin: Skin color, texture, turgor normal. No rashes or lesions ?Neurologic: Grossly normal ? ?EKG sinus rhythm at 79 with incomplete right bundle branch block.  I personally reviewed this EKG. ? ?ASSESSMENT AND PLAN:  ? ?Peripheral arterial disease ([place]) ?Mr. Amore returns today for follow-up of his Doppler studies performed because of right lower extremity claudication.  This revealed a right ABI of 0.65 with an occluded right SFA.  He wishes to proceed with outpatient peripheral angiography and endovascular therapy for lifestyle-limiting claudication. ? ? ? ? ?Lorretta Harp MD FACP,FACC,FAHA, FSCAI ?10/17/2021 ?10:54 AM ?

## 2021-10-18 LAB — CBC
Hematocrit: 46.8 % (ref 37.5–51.0)
Hemoglobin: 16.7 g/dL (ref 13.0–17.7)
MCH: 32.6 pg (ref 26.6–33.0)
MCHC: 35.7 g/dL (ref 31.5–35.7)
MCV: 91 fL (ref 79–97)
Platelets: 269 10*3/uL (ref 150–450)
RBC: 5.12 x10E6/uL (ref 4.14–5.80)
RDW: 12.7 % (ref 11.6–15.4)
WBC: 9.6 10*3/uL (ref 3.4–10.8)

## 2021-10-18 LAB — BASIC METABOLIC PANEL
BUN/Creatinine Ratio: 10 (ref 9–20)
BUN: 11 mg/dL (ref 6–24)
CO2: 22 mmol/L (ref 20–29)
Calcium: 9.7 mg/dL (ref 8.7–10.2)
Chloride: 100 mmol/L (ref 96–106)
Creatinine, Ser: 1.07 mg/dL (ref 0.76–1.27)
Glucose: 120 mg/dL — ABNORMAL HIGH (ref 70–99)
Potassium: 4.5 mmol/L (ref 3.5–5.2)
Sodium: 138 mmol/L (ref 134–144)
eGFR: 83 mL/min/{1.73_m2} (ref >59)

## 2021-10-25 ENCOUNTER — Telehealth: Payer: Self-pay | Admitting: *Deleted

## 2021-10-25 NOTE — Telephone Encounter (Signed)
Abdominal aortogram scheduled at [facility] for: Monday Oct 29, 2021 7:30 AM ?Arrival time and place: [place] Entrance A at: 5:30 AM ? ? ?No solid food after midnight prior to cath, clear liquids until 5 AM day of procedure. ? ?Medication instructions: ?-Hold: ? Lisinopril/HCT-AM of procedure  ?-Except hold medications usual morning medications can be taken with sips of water including aspirin 81 mg. ? ?Confirmed patient has responsible adult to drive home post procedure and be with patient first 24 hours after arriving home. ? ?Patient reports no new symptoms concerning for COVID-19/no exposure to COVID-19 in the past 10 days. ? ?Reviewed procedure instructions with patient.  ? ?

## 2021-10-29 ENCOUNTER — Other Ambulatory Visit: Payer: Self-pay

## 2021-10-29 ENCOUNTER — Other Ambulatory Visit (HOSPITAL_COMMUNITY): Payer: Self-pay

## 2021-10-29 ENCOUNTER — Encounter (HOSPITAL_COMMUNITY)
Admission: RE | Disposition: A | Discharge status: Home / Self Care | Payer: Self-pay | Source: Home / Self Care | Attending: Cardiovascular Disease

## 2021-10-29 ENCOUNTER — Ambulatory Visit (HOSPITAL_COMMUNITY)
Admission: RE | Admit: 2021-10-29 | Discharge: 2021-10-29 | Disposition: A | Discharge status: Home / Self Care | Payer: Medicaid Other | Attending: Cardiovascular Disease | Admitting: Cardiovascular Disease

## 2021-10-29 DIAGNOSIS — F17210 Nicotine dependence, cigarettes, uncomplicated: Secondary | ICD-10-CM | POA: Insufficient documentation

## 2021-10-29 DIAGNOSIS — I70211 Atherosclerosis of native arteries of extremities with intermittent claudication, right leg: Secondary | ICD-10-CM

## 2021-10-29 DIAGNOSIS — E785 Hyperlipidemia, unspecified: Secondary | ICD-10-CM | POA: Insufficient documentation

## 2021-10-29 DIAGNOSIS — I739 Peripheral vascular disease, unspecified: Secondary | ICD-10-CM

## 2021-10-29 DIAGNOSIS — J449 Chronic obstructive pulmonary disease, unspecified: Secondary | ICD-10-CM | POA: Insufficient documentation

## 2021-10-29 DIAGNOSIS — I10 Essential (primary) hypertension: Secondary | ICD-10-CM | POA: Insufficient documentation

## 2021-10-29 HISTORY — PX: ABDOMINAL AORTOGRAM W/LOWER EXTREMITY: CATH118223

## 2021-10-29 HISTORY — PX: PERIPHERAL VASCULAR ATHERECTOMY: CATH118256

## 2021-10-29 LAB — POCT ACTIVATED CLOTTING TIME
Activated Clotting Time: 257 seconds
Activated Clotting Time: 269 seconds
Activated Clotting Time: 269 seconds
Activated Clotting Time: 269 seconds
Activated Clotting Time: 269 seconds
Activated Clotting Time: 215 seconds
Activated Clotting Time: 179 seconds

## 2021-10-29 SURGERY — ABDOMINAL AORTOGRAM W/LOWER EXTREMITY
Anesthesia: LOCAL | Laterality: Bilateral

## 2021-10-29 MED ORDER — CLOPIDOGREL BISULFATE 300 MG PO TABS
ORAL_TABLET | ORAL | Status: DC | PRN
  Administered 2021-10-29: 600 mg via ORAL

## 2021-10-29 MED ORDER — FENTANYL CITRATE (PF) 100 MCG/2ML IJ SOLN
INTRAMUSCULAR | Status: AC
  Filled 2021-10-29: qty 2

## 2021-10-29 MED ORDER — ASPIRIN 81 MG PO CHEW: 81.0000 mg | CHEWABLE_TABLET | ORAL | Status: DC

## 2021-10-29 MED ORDER — SODIUM CHLORIDE 0.9 % WEIGHT BASED INFUSION
3.0000 mL/kg/h | INTRAVENOUS | Status: AC
  Administered 2021-10-29: 3 mL/kg/h via INTRAVENOUS

## 2021-10-29 MED ORDER — CLOPIDOGREL BISULFATE 75 MG PO TABS: 75.0000 mg | ORAL_TABLET | Freq: Every day | ORAL | Status: DC

## 2021-10-29 MED ORDER — IODIXANOL 320 MG/ML IV SOLN
INTRAVENOUS | Status: DC | PRN
  Administered 2021-10-29: 270 mL

## 2021-10-29 MED ORDER — SODIUM CHLORIDE 0.9 % IV SOLN: 250.0000 mL | INTRAVENOUS | Status: DC | PRN

## 2021-10-29 MED ORDER — HEPARIN SODIUM (PORCINE) 1000 UNIT/ML IJ SOLN
INTRAMUSCULAR | Status: AC
Start: 2021-10-29 — End: ?
  Filled 2021-10-29: qty 20

## 2021-10-29 MED ORDER — HEPARIN (PORCINE) IN NACL 1000-0.9 UT/500ML-% IV SOLN
INTRAVENOUS | Status: AC
Start: 2021-10-29 — End: ?
  Filled 2021-10-29: qty 1000

## 2021-10-29 MED ORDER — HEPARIN SODIUM (PORCINE) 1000 UNIT/ML IJ SOLN
INTRAMUSCULAR | Status: DC | PRN
  Administered 2021-10-29: 4000 [IU] via INTRAVENOUS
  Administered 2021-10-29: 2500 [IU] via INTRAVENOUS
  Administered 2021-10-29: 3000 [IU] via INTRAVENOUS
  Administered 2021-10-29: 12000 [IU] via INTRAVENOUS

## 2021-10-29 MED ORDER — LIDOCAINE HCL (PF) 1 % IJ SOLN
INTRAMUSCULAR | Status: DC | PRN
  Administered 2021-10-29: 25 mL

## 2021-10-29 MED ORDER — ACETAMINOPHEN 325 MG PO TABS: 650.0000 mg | ORAL_TABLET | ORAL | Status: DC | PRN

## 2021-10-29 MED ORDER — CLOPIDOGREL BISULFATE 75 MG PO TABS
75.0000 mg | ORAL_TABLET | Freq: Every day | ORAL | 2 refills | Status: DC
  Filled 2021-10-29: qty 30, 30d supply, fill #0

## 2021-10-29 MED ORDER — SODIUM CHLORIDE 0.9% FLUSH: 3.0000 mL | INTRAVENOUS | Status: DC | PRN

## 2021-10-29 MED ORDER — HEPARIN (PORCINE) IN NACL 1000-0.9 UT/500ML-% IV SOLN
INTRAVENOUS | Status: DC | PRN
  Administered 2021-10-29 (×2): 500 mL

## 2021-10-29 MED ORDER — ONDANSETRON HCL 4 MG/2ML IJ SOLN: 4.0000 mg | Freq: Four times a day (QID) | INTRAMUSCULAR | Status: DC | PRN

## 2021-10-29 MED ORDER — SODIUM CHLORIDE 0.9 % WEIGHT BASED INFUSION: 1.0000 mL/kg/h | INTRAVENOUS | Status: DC

## 2021-10-29 MED ORDER — PANTOPRAZOLE SODIUM 40 MG PO TBEC
40.0000 mg | DELAYED_RELEASE_TABLET | Freq: Every day | ORAL | 1 refills | Status: DC
Start: 2021-10-29 — End: 2022-01-07
  Filled 2021-10-29: qty 30, 30d supply, fill #0

## 2021-10-29 MED ORDER — FENTANYL CITRATE (PF) 100 MCG/2ML IJ SOLN
INTRAMUSCULAR | Status: DC | PRN
  Administered 2021-10-29: 25 ug via INTRAVENOUS

## 2021-10-29 MED ORDER — SODIUM CHLORIDE 0.9% FLUSH: 3.0000 mL | Freq: Two times a day (BID) | INTRAVENOUS | Status: DC

## 2021-10-29 MED ORDER — MIDAZOLAM HCL 2 MG/2ML IJ SOLN
INTRAMUSCULAR | Status: DC | PRN
  Administered 2021-10-29: 1 mg via INTRAVENOUS

## 2021-10-29 MED ORDER — SODIUM CHLORIDE 0.9 % IV SOLN: INTRAVENOUS | Status: AC

## 2021-10-29 MED ORDER — CLOPIDOGREL BISULFATE 300 MG PO TABS
ORAL_TABLET | ORAL | Status: AC
  Filled 2021-10-29: qty 1

## 2021-10-29 MED ORDER — ASPIRIN EC 81 MG PO TBEC: 81.0000 mg | DELAYED_RELEASE_TABLET | Freq: Every day | ORAL | Status: DC

## 2021-10-29 MED ORDER — MORPHINE SULFATE (PF) 2 MG/ML IV SOLN: 2.0000 mg | INTRAVENOUS | Status: DC | PRN

## 2021-10-29 MED ORDER — LABETALOL HCL 5 MG/ML IV SOLN: 10.0000 mg | INTRAVENOUS | Status: DC | PRN

## 2021-10-29 MED ORDER — ATORVASTATIN CALCIUM 80 MG PO TABS: 80.0000 mg | ORAL_TABLET | Freq: Every day | ORAL | Status: DC

## 2021-10-29 MED ORDER — LIDOCAINE HCL (PF) 1 % IJ SOLN
INTRAMUSCULAR | Status: AC
  Filled 2021-10-29: qty 30

## 2021-10-29 MED ORDER — HEPARIN SODIUM (PORCINE) 1000 UNIT/ML IJ SOLN
INTRAMUSCULAR | Status: AC
  Filled 2021-10-29: qty 10

## 2021-10-29 MED ORDER — MIDAZOLAM HCL 2 MG/2ML IJ SOLN
INTRAMUSCULAR | Status: AC
  Filled 2021-10-29: qty 2

## 2021-10-29 MED ORDER — HYDRALAZINE HCL 20 MG/ML IJ SOLN: 5.0000 mg | INTRAMUSCULAR | Status: DC | PRN

## 2021-10-29 SURGICAL SUPPLY — 26 items
BAG SNAP BAND KOVER 36X36 (MISCELLANEOUS) ×1 IMPLANT
BALLN ADMIRAL INPACT 5X200 (BALLOONS) ×3
BALLN COYOTE OTW 2.5X150X150 (BALLOONS) ×3
BALLOON ADMIRAL INPACT 5X200 (BALLOONS) IMPLANT
BALLOON COYOTE OTW 2.5X150X150 (BALLOONS) IMPLANT
CATH ANGIO 5F PIGTAIL 65CM (CATHETERS) ×1 IMPLANT
CATH CROSS OVER TEMPO 5F (CATHETERS) ×1 IMPLANT
CATH HAWKONE LX EXTENDED TIP (CATHETERS) ×1 IMPLANT
CATH VIANCE CROSS STAND 150CM (MICROCATHETER) ×3
CATH VIANCE CROSS STD 150CM (MICROCATHETER) IMPLANT
DEVICE SPIDERFX EMB PROT 6MM (WIRE) ×1 IMPLANT
GUIDEWIRE ASTATO XS 20G 300CM (WIRE) ×1 IMPLANT
GUIDEWIRE ZILIENT 6G 014 (WIRE) ×1 IMPLANT
KIT ENCORE 26 ADVANTAGE (KITS) ×1 IMPLANT
KIT ESSENTIALS PG (KITS) ×1 IMPLANT
KIT PV (KITS) ×3 IMPLANT
SHEATH HIGHFLEX ANSEL 7FR 55CM (SHEATH) ×1 IMPLANT
SHEATH PINNACLE 5F 10CM (SHEATH) ×1 IMPLANT
SHEATH PINNACLE 7F 10CM (SHEATH) ×1 IMPLANT
STOPCOCK MORSE 400PSI 3WAY (MISCELLANEOUS) ×1 IMPLANT
SYR MEDRAD MARK 7 150ML (SYRINGE) ×3 IMPLANT
TAPE SHOOT N SEE (TAPE) ×1 IMPLANT
TRANSDUCER W/STOPCOCK (MISCELLANEOUS) ×3 IMPLANT
TRAY PV CATH (CUSTOM PROCEDURE TRAY) ×3 IMPLANT
TUBING CIL FLEX 10 FLL-RA (TUBING) ×1 IMPLANT
WIRE HITORQ VERSACORE ST 145CM (WIRE) ×1 IMPLANT

## 2021-10-29 NOTE — Progress Notes (Signed)
Pt ambulated to BR with own cane without difficulty-left groin site WNL ?

## 2021-10-29 NOTE — Progress Notes (Signed)
? ?  Patient underwent peripheral angiogram today with directional atherectomy followed by drug-coated balloon angioplasty of right SFA with Dr. Gwenlyn Found. He will be discharged on DAPT. Sent in prescription of Plavix to [facility]. Patient is on Omeprazole at home so advised patient to start this and will start Protonix instead. Plan is for him to be discharged later today after bed rest is over. He already has repeat ultrasounds and follow-up visit with Dr. Gwenlyn Found arranged. ? ?Darreld Mclean, PA-C ?10/29/2021 1:10 PM ? ? ?

## 2021-10-29 NOTE — Progress Notes (Signed)
Site area: left groin ? ?Site Prior to Removal:  Level 0 ? ?Pressure Applied For 35 MINUTES   ? ?Minutes Beginning at 1155 ? ?Manual:   Yes.   ? ?Patient Status During Pull:  Stable ? ?Post Pull Groin Site:  Level 0 ? ?Post Pull Instructions Given:  Yes.   ? ?Post Pull Pulses Present:  Yes.   ? ?Dressing Applied:  Yes.   ? ?Comments:  be df rest started at 1230 X 6 hr.  ?

## 2021-10-29 NOTE — Interval H&P Note (Signed)
History and Physical Interval Note: ? ?10/29/2021 ?7:31 AM ? ?Timothy Nielsen  has presented today for surgery, with the diagnosis of claudication.  The various methods of treatment have been discussed with the patient and family. After consideration of risks, benefits and other options for treatment, the patient has consented to  Procedure(s): ?ABDOMINAL AORTOGRAM W/LOWER EXTREMITY (N/A) as a surgical intervention.  The patient's history has been reviewed, patient examined, no change in status, stable for surgery.  I have reviewed the patient's chart and labs.  Questions were answered to the patient's satisfaction.   ? ? ?[name] ? ? ?

## 2021-10-30 ENCOUNTER — Encounter (HOSPITAL_COMMUNITY): Payer: Self-pay | Admitting: Cardiovascular Disease

## 2021-10-31 ENCOUNTER — Other Ambulatory Visit: Payer: Self-pay | Admitting: Physician Assistant

## 2021-10-31 DIAGNOSIS — I10 Essential (primary) hypertension: Secondary | ICD-10-CM

## 2021-10-31 DIAGNOSIS — E785 Hyperlipidemia, unspecified: Secondary | ICD-10-CM

## 2021-11-02 ENCOUNTER — Telehealth: Payer: Self-pay | Admitting: Cardiovascular Disease

## 2021-11-02 NOTE — Telephone Encounter (Signed)
Called pt back regarding bruising after procedure on 10/29/21. Pt describes bruising as purple around the site and wraps around his leg and goes in towards his inner thigh. Pt denies swelling, redness, bleeding, oozing or warmth to the touch. Pt reports a small marble sized knot at the entry site where access was. Explained to pt that if he has any signs of bleeding or infection to let us know. Pt will have follow up dopplers on 5/25 and back to see Dr. Allyson Sabal on 5/30. Pt verbalizes understanding.  ?

## 2021-11-02 NOTE — Telephone Encounter (Signed)
New Message: ? ? ? ? ? ?Patient had a procedure on 10-29-21. He said around the area that they went in, he have a lot of bruising and a knot. The patient wants to know if this is normal? ?

## 2021-11-12 ENCOUNTER — Encounter: Payer: Self-pay | Admitting: Physician Assistant

## 2021-11-12 ENCOUNTER — Ambulatory Visit: Payer: Medicaid Other | Admitting: Physician Assistant

## 2021-11-12 VITALS — BP 123/78 | HR 70 | Temp 97.0°F

## 2021-11-12 DIAGNOSIS — I739 Peripheral vascular disease, unspecified: Secondary | ICD-10-CM

## 2021-11-12 DIAGNOSIS — E785 Hyperlipidemia, unspecified: Secondary | ICD-10-CM

## 2021-11-12 DIAGNOSIS — K219 Gastro-esophageal reflux disease without esophagitis: Secondary | ICD-10-CM

## 2021-11-12 DIAGNOSIS — J449 Chronic obstructive pulmonary disease, unspecified: Secondary | ICD-10-CM

## 2021-11-12 DIAGNOSIS — I10 Essential (primary) hypertension: Secondary | ICD-10-CM

## 2021-11-12 NOTE — Progress Notes (Signed)
BP 123/78   Pulse 70   Temp (!) 97 F (36.1 C)   SpO2 98%    Subjective:    Patient ID: Timothy Nielsen, male    DOB: 1970-10-18, 51 y.o.   MRN: [id number]  HPI: Timothy Nielsen is a 51 y.o. male presenting on 11/12/2021 for Hypertension, Hyperlipidemia, and COPD   HPI    Chief Complaint  Patient presents with   Hypertension   Hyperlipidemia   COPD    Pt had atherectomy  on 10/29/21 and was started on DAPT.    He says he is doing better.  He has stopped smoking.  He did not get labs drawn due to he says he didn't know he was needing to.  Pt reports he still gets heaviness in his chest at times.  He doesn't use his NTG because he says his heart test was good.  hE has follow up with vascular surgery and cardiology next week.    Relevant past medical, surgical, family and social history reviewed and updated as indicated. Interim medical history since our last visit reviewed. Allergies and medications reviewed and updated.   Current Outpatient Medications:    albuterol (VENTOLIN HFA) 108 (90 Base) MCG/ACT inhaler, INHALE 2 PUFFS BY MOUTH EVERY 6 HOURS AS NEEDED FOR COUGHING, WHEEZING, OR SHORTNESS OF BREATH, Disp: 20.1 g, Rfl: 0   aspirin EC 81 MG tablet, Take 81 mg by mouth daily. Swallow whole., Disp: , Rfl:    atorvastatin (LIPITOR) 40 MG tablet, Take 1 tablet (40 mg total) by mouth daily., Disp: 90 tablet, Rfl: 1   clopidogrel (PLAVIX) 75 MG tablet, Take 1 tablet (75 mg total) by mouth daily., Disp: 30 tablet, Rfl: 2   Cyanocobalamin 1500 MCG TBDP, Take 3,000 mcg by mouth daily., Disp: , Rfl:    fluticasone furoate-vilanterol (BREO ELLIPTA) 100-25 MCG/ACT AEPB, Inhale 1 puff into the lungs daily., Disp: 3 each, Rfl: 3   lisinopril-hydrochlorothiazide (ZESTORETIC) 20-12.5 MG tablet, TAKE 1 Tablet BY MOUTH ONCE EVERY DAY, Disp: 90 tablet, Rfl: 0   metoprolol tartrate (LOPRESSOR) 25 MG tablet, Take 1 tablet (25 mg total) by mouth 2 (two) times daily., Disp: 180 tablet, Rfl: 1    nitroGLYCERIN (NITROSTAT) 0.4 MG SL tablet, Place 1 tablet (0.4 mg total) under the tongue every 5 (five) minutes as needed for chest pain., Disp: 25 tablet, Rfl: 3   pantoprazole (PROTONIX) 40 MG tablet, Take 1 tablet (40 mg total) by mouth daily., Disp: 30 tablet, Rfl: 1    Review of Systems  Per HPI unless specifically indicated above     Objective:    BP 123/78   Pulse 70   Temp (!) 97 F (36.1 C)   SpO2 98%   Wt Readings from Last 3 Encounters:  10/29/21 272 lb (123.4 kg)  10/17/21 267 lb (121.1 kg)  09/11/21 267 lb (121.1 kg)    Physical Exam Vitals reviewed.  Constitutional:      General: He is not in acute distress.    Appearance: He is well-developed. He is not ill-appearing.  HENT:     Head: Normocephalic and atraumatic.  Cardiovascular:     Rate and Rhythm: Normal rate and regular rhythm.     Pulses:          Dorsalis pedis pulses are 1+ on the right side and 1+ on the left side.  Pulmonary:     Effort: Pulmonary effort is normal.     Breath sounds: Normal breath sounds. No wheezing.  Abdominal:     General: Bowel sounds are normal.     Palpations: Abdomen is soft.     Tenderness: There is no abdominal tenderness.  Musculoskeletal:     Cervical back: Neck supple.     Right lower leg: No edema.     Left lower leg: No edema.  Lymphadenopathy:     Cervical: No cervical adenopathy.  Skin:    General: Skin is warm and dry.     Comments: Bruising left thigh- fading  Neurological:     Mental Status: He is alert and oriented to person, place, and time.  Psychiatric:        Behavior: Behavior normal.           Assessment & Plan:    Encounter Diagnoses  Name Primary?   Primary hypertension Yes   Hyperlipidemia, unspecified hyperlipidemia type    Chronic obstructive pulmonary disease, unspecified COPD type (HCC)    PAD (peripheral artery disease) (HCC)    Gastroesophageal reflux disease, unspecified whether esophagitis present      Pt to get  fasting labs.  He will be called with results  He is congratulated on stopping smoking and is encouraged to stay smoke-free  Discussed with pt that clopidogrel is available through [facility] but pantoprazole is not  Pt encouraged to use his NTG as prescribed when he has CP.   He is encouraged to discuss this with cardiologist at follow up next week.  Pt to follow up 3 months.  He is to contact office sooner prn

## 2021-11-13 ENCOUNTER — Telehealth: Payer: Self-pay | Admitting: Cardiovascular Disease

## 2021-11-13 ENCOUNTER — Other Ambulatory Visit (HOSPITAL_COMMUNITY): Payer: Self-pay

## 2021-11-13 ENCOUNTER — Telehealth (HOSPITAL_COMMUNITY): Payer: Self-pay

## 2021-11-13 NOTE — Telephone Encounter (Signed)
Pharmacy Transitions of Care Follow-up Telephone Call  Date of discharge: 10/29/21  Discharge Diagnosis: Atherectomy  How have you been since you were released from the hospital? Patient doing well.    Medication changes made at discharge: START taking: clopidogrel (Plavix)  pantoprazole (Protonix)  CHANGE how you take: Cyanocobalamin  STOP taking: omeprazole 40 MG capsule (PRILOSEC)   Medication changes verified by the patient? Yes  Medication Accessibility:  Home Pharmacy: [facility] -- [place], [place]   Was the patient provided with refills on discharged medications? Yes   Have all prescriptions been transferred from [facility] to home pharmacy? Yes   Is the patient able to afford medications? No Eligible patient assistance: [facility] for clopidogrel; counseled on GoodRx for pantoprazole    Medication Review:  CLOPIDOGREL (PLAVIX) Clopidogrel 75 mg once daily.  - Reviewed potential DDIs with patient  - Advised patient of medications to avoid (NSAIDs, ASA)  - Educated that Tylenol (acetaminophen) will be the preferred analgesic to prevent risk of bleeding  - Emphasized importance of monitoring for signs and symptoms of bleeding (abnormal bruising, prolonged bleeding, nose bleeds, bleeding from gums, discolored urine, black tarry stools)  - Advised patient to alert all providers of anticoagulation therapy prior to starting a new medication or having a procedure   Follow-up Appointments:  PCP Hospital f/u appt confirmed? Scheduled to see Runell Gess, MD on 11/20/21 @ 8:00 am.   If their condition worsens, is the pt aware to call PCP or go to the Emergency Dept.? Yes  Final Patient Assessment: -Pt is doing well.  -Pt verbalized understanding of clopidogrel.  -Pt has post discharge appointment and pantoprazole sent to [facility]. Unable to transfer clopidogrel as [facility] doesn't accept transfers. Contacted Dr. Hazle Coca office to advise them than a new script would need to be sent.

## 2021-11-13 NOTE — Telephone Encounter (Signed)
*  STAT* If patient is at the pharmacy, call can be transferred to refill team.   1. Which medications need to be refilled? (please list name of each medication and dose if known) need a new prescription need to be faxed  for his Clopidogrel  2. Which pharmacy/location (including street and city if local pharmacy) is medication to be sent to? [facility]  [phone] DFax#  [phone]  3. Do they need a 30 day or 90 day supply? 90 days and refills

## 2021-11-14 ENCOUNTER — Other Ambulatory Visit: Payer: Self-pay

## 2021-11-14 MED ORDER — CLOPIDOGREL BISULFATE 75 MG PO TABS: 75.0000 mg | ORAL_TABLET | Freq: Every day | ORAL | 2 refills | Status: AC

## 2021-11-14 NOTE — Telephone Encounter (Signed)
Called patient to advise Medication refill sent to pharmacy. Left detailed message.

## 2021-11-15 ENCOUNTER — Other Ambulatory Visit (HOSPITAL_COMMUNITY)
Admission: RE | Admit: 2021-11-15 | Discharge: 2021-11-15 | Disposition: A | Discharge status: Home / Self Care | Payer: Self-pay | Source: Ambulatory Visit | Attending: Physician Assistant | Admitting: Physician Assistant

## 2021-11-15 ENCOUNTER — Ambulatory Visit (HOSPITAL_COMMUNITY)
Admission: RE | Admit: 2021-11-15 | Discharge: 2021-11-15 | Disposition: A | Discharge status: Home / Self Care | Payer: Medicaid Other | Source: Ambulatory Visit | Attending: Cardiovascular Disease | Admitting: Cardiovascular Disease

## 2021-11-15 DIAGNOSIS — E785 Hyperlipidemia, unspecified: Secondary | ICD-10-CM

## 2021-11-15 DIAGNOSIS — I10 Essential (primary) hypertension: Secondary | ICD-10-CM

## 2021-11-15 DIAGNOSIS — I739 Peripheral vascular disease, unspecified: Secondary | ICD-10-CM | POA: Insufficient documentation

## 2021-11-15 LAB — COMPREHENSIVE METABOLIC PANEL
ALT: 67 U/L — ABNORMAL HIGH (ref 0–44)
AST: 29 U/L (ref 15–41)
Albumin: 4.3 g/dL (ref 3.5–5.0)
Alkaline Phosphatase: 122 U/L (ref 38–126)
Anion gap: 6 (ref 5–15)
BUN: 6 mg/dL (ref 6–20)
CO2: 25 mmol/L (ref 22–32)
Calcium: 9 mg/dL (ref 8.9–10.3)
Chloride: 105 mmol/L (ref 98–111)
Creatinine, Ser: 0.86 mg/dL (ref 0.61–1.24)
GFR, Estimated: >60 mL/min (ref >60)
Glucose, Bld: 119 mg/dL — ABNORMAL HIGH (ref 70–99)
Potassium: 3.7 mmol/L (ref 3.5–5.1)
Sodium: 136 mmol/L (ref 135–145)
Total Bilirubin: 0.8 mg/dL (ref 0.3–1.2)
Total Protein: 7 g/dL (ref 6.5–8.1)

## 2021-11-15 LAB — LIPID PANEL
Cholesterol: 138 mg/dL (ref 0–200)
HDL: 32 mg/dL — ABNORMAL LOW (ref >40)
LDL Cholesterol: 75 mg/dL (ref 0–99)
Total CHOL/HDL Ratio: 4.3 RATIO
Triglycerides: 157 mg/dL — ABNORMAL HIGH (ref <150)
VLDL: 31 mg/dL (ref 0–40)

## 2021-11-20 ENCOUNTER — Ambulatory Visit: Payer: Medicaid Other | Admitting: Cardiovascular Disease

## 2021-11-20 ENCOUNTER — Encounter (HOSPITAL_COMMUNITY): Payer: Medicaid Other

## 2021-11-20 ENCOUNTER — Telehealth: Payer: Self-pay

## 2021-11-20 NOTE — Telephone Encounter (Signed)
Spoke with pt regarding re-scheduling his office visit with Dr. Allyson Sabal. Able to change appointment. Pt verbalizes understanding.

## 2021-11-21 ENCOUNTER — Encounter: Payer: Self-pay | Admitting: Cardiology

## 2021-11-21 NOTE — Progress Notes (Unsigned)
Cardiology Office Note  Date: 11/22/2021   ID: Timothy Nielsen, DOB Dec 13, 1970, MRN [id number]  PCP:  Soyla Dryer, PA-C  Cardiologist:  Rozann Lesches, MD Electrophysiologist:  None   Chief Complaint  Patient presents with   Cardiac follow-up    History of Present Illness: Timothy Nielsen is a 51 y.o. male seen in consultation back in March.  He is here for a follow-up visit.  States that he has been walking for exercise, still has to use a cane at times.  He quit smoking about a month ago as well.  Cardiac structural and ischemic testing from March is outlined below.  Plan to continue medical therapy at this point.  He did have evaluation by Dr. Gwenlyn Found for PAD and ultimately underwent directional atherectomy and drug-coated balloon angioplasty of the right SFA in early May.  Reports improvement although not resolution of claudication.  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 SFA    Current Outpatient Medications  Medication Sig Dispense Refill   albuterol (VENTOLIN HFA) 108 (90 Base) MCG/ACT inhaler INHALE 2 PUFFS BY MOUTH EVERY 6 HOURS AS NEEDED FOR COUGHING, WHEEZING, OR SHORTNESS OF BREATH 20.1 g 0   aspirin EC 81 MG tablet Take 81 mg by mouth daily. Swallow whole.     atorvastatin (LIPITOR) 40 MG tablet  Take 1 tablet (40 mg total) by mouth daily. 90 tablet 1   clopidogrel (PLAVIX) 75 MG tablet Take 1 tablet (75 mg total) by mouth daily. 90 tablet 2   Cyanocobalamin 1500 MCG TBDP Take 3,000 mcg by mouth daily.     fluticasone furoate-vilanterol (BREO ELLIPTA) 100-25 MCG/ACT AEPB Inhale 1 puff into the lungs daily. 3 each 3   lisinopril-hydrochlorothiazide (ZESTORETIC) 20-12.5 MG tablet TAKE 1 Tablet BY MOUTH ONCE EVERY DAY 90 tablet 0   metoprolol tartrate (LOPRESSOR) 25 MG tablet Take 1 tablet (25 mg total) by mouth 2 (two) times daily. 180 tablet 1   nitroGLYCERIN (NITROSTAT) 0.4 MG SL tablet Place 1 tablet (0.4 mg total) under the tongue every 5 (five) minutes as needed for chest pain. 25 tablet 3   pantoprazole (PROTONIX) 40 MG tablet Take 1 tablet (40 mg total) by mouth daily. 30 tablet 1   No current facility-administered medications for this visit.   Allergies:  Patient has no known allergies.   ROS: No palpitations or syncope.  Physical Exam: VS:  BP 110/78   Pulse 79   Ht 6\' 6"  (1.981 m)   Wt 274 lb (124.3 kg)   SpO2 97%   BMI 31.66 kg/m , BMI Body mass index is 31.66 kg/m.  Wt Readings from Last 3 Encounters:  11/22/21 274 lb (124.3 kg)  10/29/21 272 lb (123.4 kg)  10/17/21 267 lb (121.1 kg)    General: Patient appears  comfortable at rest. HEENT: Conjunctiva and lids normal. Neck: Supple, no elevated JVP or carotid bruits, no thyromegaly. Lungs: Clear to auscultation, nonlabored breathing at rest. Cardiac: Regular rate and rhythm, no S3 or significant systolic murmur, no pericardial rub. Extremities: No pitting edema.  ECG:  An ECG dated 10/17/2021 was personally reviewed today and demonstrated:  Sinus rhythm with lead motion artifact.  Recent Labwork: 10/17/2021: Hemoglobin 16.7; Platelets 269 11/15/2021: ALT 67; AST 29; BUN 6; Creatinine, Ser 0.86; Potassium 3.7; Sodium 136     Component Value Date/Time   CHOL 138 11/15/2021 1031   CHOL 142 03/30/2019 1618    TRIG 157 (H) 11/15/2021 1031   HDL 32 (L) 11/15/2021 1031   HDL 55 03/30/2019 1618   CHOLHDL 4.3 11/15/2021 1031   VLDL 31 11/15/2021 1031   LDLCALC 75 11/15/2021 1031   LDLCALC 72 03/30/2019 1618    Other Studies Reviewed Today:  Lexiscan Myoview 08/28/2021:   Findings are consistent with no prior ischemia. The study is intermediate risk.   No ST deviation was noted.   Left ventricular function is abnormal. Global function is mildly reduced. End diastolic cavity size is moderately enlarged. End systolic cavity size is mildly enlarged.   Prior study not available for comparison.   Findings: Occasional PVCs during pharmacological stress.  No evidence of ischemia or infarction. There is an apical perfusion defect that improves with stress consistent with artifact. There is basal, inferior septal wall motion abnormality with increase in LV size and LVEF of 49 % (mildly reduced) but in the setting of subdiaphragmatic attenuation worse at rest.   Conclusions: Stress test is negative for ischemia and less suggestive of infarction Moderate risk study due to mildly decrease LVEF. Consider echocardiogram if clinically indicated.  Echocardiogram 09/04/2021:  1. Left ventricular ejection fraction, by estimation, is 60 to 65%. The  left ventricle has normal function. The left ventricle has no regional  wall motion abnormalities. Left ventricular diastolic parameters are  indeterminate. The average left  ventricular global longitudinal strain is -20.5 %. The global longitudinal  strain is normal.   2. Right ventricular systolic function is normal. The right ventricular  size is normal. Tricuspid regurgitation signal is inadequate for assessing  PA pressure.   3. The mitral valve is normal in structure. No evidence of mitral valve  regurgitation. No evidence of mitral stenosis.   4. The aortic valve is tricuspid. Aortic valve regurgitation is not  visualized. No aortic stenosis is present.    5. Aortic dilatation noted. 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   6. The inferior vena cava is normal in size with greater than 50%  respiratory variability, suggesting right atrial pressure of 3 mmHg.   Assessment and Plan:  1.  Stable dyspnea on exertion and angina symptoms being managed medically.  Lexiscan Myoview in March showed no large ischemic territories and LVEF was normal by concurrent echocardiogram, 60 to 65% range.  Continue aspirin, Plavix, Zestoretic, Lopressor, Lipitor, and as needed nitroglycerin.  2.  Tobacco abuse in remission.  He quit smoking about a month ago.  3.  PAD status post directional atherectomy and drug-coated balloon angioplasty of the right SFA in May by Dr. Gwenlyn Found.  Continue antiplatelet regimen and statin.  I talked about continuing a regular walking plan to improve claudication.  4.  Mixed hyperlipidemia, on Lipitor.  Recent LDL 75.  Medication Adjustments/Labs and Tests Ordered: Current medicines are reviewed at length with the patient  today.  Concerns regarding medicines are outlined above.   Tests Ordered: No orders of the defined types were placed in this encounter.   Medication Changes: No orders of the defined types were placed in this encounter.   Disposition:  Follow up  6 months.  Signed, Satira Sark, MD, [facility] 11/22/2021 8:56 AM    [facility] [place], [place], [place] [address] Phone: [phone]; Fax: [phone]

## 2021-11-22 ENCOUNTER — Encounter: Payer: Self-pay | Admitting: Cardiology

## 2021-11-22 ENCOUNTER — Ambulatory Visit (INDEPENDENT_AMBULATORY_CARE_PROVIDER_SITE_OTHER): Payer: Self-pay | Admitting: Cardiology

## 2021-11-22 VITALS — BP 110/78 | HR 79 | Ht 78.0 in | Wt 274.0 lb

## 2021-11-22 DIAGNOSIS — F17201 Nicotine dependence, unspecified, in remission: Secondary | ICD-10-CM

## 2021-11-22 DIAGNOSIS — I739 Peripheral vascular disease, unspecified: Secondary | ICD-10-CM

## 2021-11-22 DIAGNOSIS — E782 Mixed hyperlipidemia: Secondary | ICD-10-CM

## 2021-11-22 NOTE — Patient Instructions (Signed)

## 2021-11-27 ENCOUNTER — Ambulatory Visit (INDEPENDENT_AMBULATORY_CARE_PROVIDER_SITE_OTHER): Payer: Self-pay | Admitting: Cardiovascular Disease

## 2021-11-27 ENCOUNTER — Encounter: Payer: Self-pay | Admitting: Cardiovascular Disease

## 2021-11-27 DIAGNOSIS — I739 Peripheral vascular disease, unspecified: Secondary | ICD-10-CM

## 2021-11-27 NOTE — Addendum Note (Signed)
Addended by: Beatrix Fetters on: 11/27/2021 09:13 AM   Modules accepted: Orders

## 2021-11-27 NOTE — Assessment & Plan Note (Signed)
History of peripheral arterial disease status post right SFA directional atherectomy with drug-coated balloon angioplasty by myself 10/29/2021 because of lifestyle-limiting claudication.  He had excellent angiographic result.  His recent Doppler studies performed 11/15/2021 revealed a widely patent SFA and his right ABI did increase from 0.65 up to 0.80.  His claudication has markedly improved.  He has stopped smoking.  I we will recheck lower extremity with Doppler studies in 6 months and will see him back in 1 year for follow-up.

## 2021-11-27 NOTE — Patient Instructions (Signed)
Medication Instructions:  Your physician recommends that you continue on your current medications as directed. Please refer to the Current Medication list given to you today.  *If you need a refill on your cardiac medications before your next appointment, please call your pharmacy*   Testing/Procedures: Your physician has requested that you have a lower extremity arterial duplex. This test is an ultrasound of the arteries in the legs. It looks at arterial blood flow in the legs. Allow one hour for Lower  Arterial scans. There are no restrictions or special instructions  Your physician has requested that you have an ankle brachial index (ABI). During this test an ultrasound and blood pressure cuff are used to evaluate the arteries that supply the arms and legs with blood. Allow thirty minutes for this exam. There are no restrictions or special instructions. To be done in December. These procedures will be done at [place]   Follow-Up: At [facility], you and your health needs are our priority.  As part of our continuing mission to provide you with exceptional heart care, we have created designated Provider Care Teams.  These Care Teams include your primary Cardiologist (physician) and Advanced Practice Providers (APPs -  Physician Assistants and Nurse Practitioners) who all work together to provide you with the care you need, when you need it.  We recommend signing up for the patient portal called "MyChart".  Sign up information is provided on this After Visit Summary.  MyChart is used to connect with patients for Virtual Visits (Telemedicine).  Patients are able to view lab/test results, encounter notes, upcoming appointments, etc.  Non-urgent messages can be sent to your provider as well.   To learn more about what you can do with MyChart, go to [email or web address].    Your next appointment:   12 month(s)  The format for your next appointment:   In  Person  Provider:   [name], MD

## 2021-11-27 NOTE — Progress Notes (Signed)
11/27/2021 [name]   08-03-1970  [id number]  Primary Physician Jacquelin Hawking, PA-C Primary Cardiologist: Runell Gess MD Nicholes Calamity, [place]  HPI:  Timothy Nielsen is a 51 y.o.  mildly overweight divorced Caucasian male with no children who is currently unemployed but previously worked for a tree company.  He was referred through the courtesy of Dr. Diona Browner, his cardiologist, for evaluation of lifestyle-limiting claudication.  I last saw him in the office 10/17/2021.  His risk factors include 35 pack years of tobacco abuse currently smoking 1/2 pack/day with the intent to quit.  He has treated hypertension and hyperlipidemia.  Both his parents had coronary intervention.  He is never had a heart attack or stroke.  He does complain of shortness of breath and occasional chest tightness.  He has COPD.  Recent had a 2D echo that was normal and a Myoview stress test that was nonischemic.  He has had lifestyle-limiting claudication for at least a year right greater than left.  He had Doppler studies performed at [facility] 07/26/2021 revealing right ABI of 0.69 and a left of 1.16.  I performed lower extreme arterial Doppler studies on him 10/04/2021 revealing a right ABI of 0.65 with an occluded right SFA.  I ultimately performed peripheral angiography and endovascular therapy on him 10/29/2021.  He had a right SFA CTO and had directional atherectomy followed by drug-coated balloon angioplasty.  He did have a small hematoma in his left groin which is resolving.  His Doppler studies performed 11/15/2021 revealed a widely patent right SFA.  His ABI increased from 0.65-0.80.  His claudication has significantly improved.  He has stopped smoking.   Current Meds  Medication Sig   albuterol (VENTOLIN HFA) 108 (90 Base) MCG/ACT inhaler INHALE 2 PUFFS BY MOUTH EVERY 6 HOURS AS NEEDED FOR COUGHING, WHEEZING, OR SHORTNESS OF BREATH   aspirin EC 81 MG tablet Take 81 mg by mouth daily. Swallow whole.    atorvastatin (LIPITOR) 40 MG tablet Take 1 tablet (40 mg total) by mouth daily.   clopidogrel (PLAVIX) 75 MG tablet Take 1 tablet (75 mg total) by mouth daily.   Cyanocobalamin 1500 MCG TBDP Take 3,000 mcg by mouth daily.   fluticasone furoate-vilanterol (BREO ELLIPTA) 100-25 MCG/ACT AEPB Inhale 1 puff into the lungs daily.   lisinopril-hydrochlorothiazide (ZESTORETIC) 20-12.5 MG tablet TAKE 1 Tablet BY MOUTH ONCE EVERY DAY   metoprolol tartrate (LOPRESSOR) 25 MG tablet Take 1 tablet (25 mg total) by mouth 2 (two) times daily.   nitroGLYCERIN (NITROSTAT) 0.4 MG SL tablet Place 1 tablet (0.4 mg total) under the tongue every 5 (five) minutes as needed for chest pain.   pantoprazole (PROTONIX) 40 MG tablet Take 1 tablet (40 mg total) by mouth daily.     No Known Allergies  Social History   Socioeconomic History   Marital status: Single    Spouse name: Not on file   Number of children: 0   Years of education: Not on file   Highest education level: Not on file  Occupational History   Not on file  Tobacco Use   Smoking status: Former    Packs/day: 0.25    Years: 15.00    Pack years: 3.75    Types: Cigarettes    Start date: 11/14/1986    Quit date: 10/29/2021    Years since quitting: 0.0   Smokeless tobacco: Never  Vaping Use   Vaping Use: Never used  Substance and Sexual Activity  Alcohol use: Yes    Alcohol/week: 18.0 standard drinks    Types: 18 Cans of beer per week    Comment: 12 pack a week   Drug use: No   Sexual activity: Not on file  Other Topics Concern   Not on file  Social History Narrative   Right Handed    Lives in a one story home    Social Determinants of Health   Financial Resource Strain: Not on file  Food Insecurity: Not on file  Transportation Needs: Not on file  Physical Activity: Not on file  Stress: Not on file  Social Connections: Not on file  Intimate Partner Violence: Not on file     Review of Systems: General: negative for chills, fever,  night sweats or weight changes.  Cardiovascular: negative for chest pain, dyspnea on exertion, edema, orthopnea, palpitations, paroxysmal nocturnal dyspnea or shortness of breath Dermatological: negative for rash Respiratory: negative for cough or wheezing Urologic: negative for hematuria Abdominal: negative for nausea, vomiting, diarrhea, bright red blood per rectum, melena, or hematemesis Neurologic: negative for visual changes, syncope, or dizziness All other systems reviewed and are otherwise negative except as noted above.    Blood pressure 114/68, pulse 83, height 6\' 6"  (1.981 m), weight 270 lb 6.4 oz (122.7 kg), SpO2 96 %.  General appearance: alert and no distress Neck: no adenopathy, no carotid bruit, no JVD, supple, symmetrical, trachea midline, and thyroid not enlarged, symmetric, no tenderness/mass/nodules Lungs: clear to auscultation bilaterally Heart: regular rate and rhythm, S1, S2 normal, no murmur, click, rub or gallop Extremities: extremities normal, atraumatic, no cyanosis or edema Pulses: 2+ and symmetric Skin: Skin color, texture, turgor normal. No rashes or lesions Neurologic: Grossly normal  EKG not performed today  ASSESSMENT AND PLAN:   Peripheral arterial disease (HCC) History of peripheral arterial disease status post right SFA directional atherectomy with drug-coated balloon angioplasty by myself 10/29/2021 because of lifestyle-limiting claudication.  He had excellent angiographic result.  His recent Doppler studies performed 11/15/2021 revealed a widely patent SFA and his right ABI did increase from 0.65 up to 0.80.  His claudication has markedly improved.  He has stopped smoking.  I we will recheck lower extremity with Doppler studies in 6 months and will see him back in 1 year for follow-up.     11/17/2021 MD FACP,FACC,FAHA, [facility] 11/27/2021 9:05 AM

## 2021-12-05 ENCOUNTER — Telehealth: Payer: Self-pay

## 2021-12-05 NOTE — Telephone Encounter (Signed)
attempted to follow up with patient regarding his cone financial assistance application status /approval as he requested in person while shopping at [employer] onsite food market on 6.7.23  Plan -pt was called but was unavailable at the time of call, but a message was left advising him to return the call as it relates to his CAFA approval status and to serve as a reminder for him to check his mail to look for the letter sent out the the CAFA dept within this week of 6.12.23.

## 2021-12-17 ENCOUNTER — Encounter: Payer: Self-pay | Admitting: Physician Assistant

## 2021-12-17 ENCOUNTER — Ambulatory Visit: Payer: Medicaid Other | Admitting: Physician Assistant

## 2021-12-17 VITALS — BP 135/83 | HR 78 | Temp 97.7°F

## 2021-12-17 DIAGNOSIS — F39 Unspecified mood [affective] disorder: Secondary | ICD-10-CM

## 2021-12-17 DIAGNOSIS — M259 Joint disorder, unspecified: Secondary | ICD-10-CM

## 2021-12-17 MED ORDER — CITALOPRAM HYDROBROMIDE 20 MG PO TABS: 20.0000 mg | ORAL_TABLET | Freq: Every day | ORAL | 0 refills | Status: DC

## 2021-12-31 ENCOUNTER — Ambulatory Visit: Payer: Medicaid Other | Admitting: Physician Assistant

## 2021-12-31 DIAGNOSIS — F39 Unspecified mood [affective] disorder: Secondary | ICD-10-CM

## 2021-12-31 NOTE — Progress Notes (Signed)
There were no vitals taken for this visit.   Subjective:    Patient ID: Timothy Nielsen, male    DOB: 1970/09/27, 51 y.o.   MRN: [id number]  HPI: Timothy Nielsen is a 51 y.o. male presenting on 12/31/2021 for No chief complaint on file.   HPI  This is a telemedicine appointment through [employer]   I connected with  [name] on 12/31/21 by a video enabled telemedicine application and verified that I am speaking with the correct person using two identifiers.   I discussed the limitations of evaluation and management by telemedicine. The patient expressed understanding and agreed to proceed.  Pt is at home.  Provider is at office.     Pt is 51yoM with appointment to follow up mood.  He was started on citalopram at 12/17/21 appointment.  He says he Can't tell much difference.  He says he is Staying inside due to the heat.  He says he "Gets aggrivated as hell" just cleaning up around the house.  He says he has Psychiatrist appt at [facility]- august 22.  He has No SI, HI.    He is staying active, exercising in the house.   He has No pets  he has an outside cat   Pt established care this office 03/01/2021.  His first mention of MH issues was reported anxiety at his 07/11/21 appointment.  He declined counseling or other treatment at that time.    His next mention of mood issues was at [facility] 12/17/21.       Relevant past medical, surgical, family and social history reviewed and updated as indicated. Interim medical history since our last visit reviewed. Allergies and medications reviewed and updated.   Current Outpatient Medications:    albuterol (VENTOLIN HFA) 108 (90 Base) MCG/ACT inhaler, INHALE 2 PUFFS BY MOUTH EVERY 6 HOURS AS NEEDED FOR COUGHING, WHEEZING, OR SHORTNESS OF BREATH, Disp: 20.1 g, Rfl: 0   aspirin EC 81 MG tablet, Take 81 mg by mouth daily. Swallow whole., Disp: , Rfl:    atorvastatin (LIPITOR) 40 MG tablet, Take 1 tablet (40 mg total) by mouth daily., Disp: 90 tablet,  Rfl: 1   citalopram (CELEXA) 20 MG tablet, Take 1 tablet (20 mg total) by mouth daily., Disp: 90 tablet, Rfl: 0   clopidogrel (PLAVIX) 75 MG tablet, Take 1 tablet (75 mg total) by mouth daily., Disp: 90 tablet, Rfl: 2   Cyanocobalamin 1500 MCG TBDP, Take 3,000 mcg by mouth daily., Disp: , Rfl:    fluticasone furoate-vilanterol (BREO ELLIPTA) 100-25 MCG/ACT AEPB, Inhale 1 puff into the lungs daily., Disp: 3 each, Rfl: 3   lisinopril-hydrochlorothiazide (ZESTORETIC) 20-12.5 MG tablet, TAKE 1 Tablet BY MOUTH ONCE EVERY DAY, Disp: 90 tablet, Rfl: 0   metoprolol tartrate (LOPRESSOR) 25 MG tablet, Take 1 tablet (25 mg total) by mouth 2 (two) times daily., Disp: 180 tablet, Rfl: 1   pantoprazole (PROTONIX) 40 MG tablet, Take 1 tablet (40 mg total) by mouth daily., Disp: 30 tablet, Rfl: 1   nitroGLYCERIN (NITROSTAT) 0.4 MG SL tablet, Place 1 tablet (0.4 mg total) under the tongue every 5 (five) minutes as needed for chest pain., Disp: 25 tablet, Rfl: 3    Review of Systems  Per HPI unless specifically indicated above     Objective:    There were no vitals taken for this visit.  Wt Readings from Last 3 Encounters:  11/27/21 270 lb 6.4 oz (122.7 kg)  11/22/21 274 lb (124.3 kg)  10/29/21 272 lb (123.4 kg)    Physical Exam Constitutional:      General: He is not in acute distress.    Appearance: He is not toxic-appearing.  HENT:     Head: Normocephalic and atraumatic.  Pulmonary:     Effort: Pulmonary effort is normal. No respiratory distress.     Comments: Pt is talking in complete sentences without dyspnea. Neurological:     Mental Status: He is alert and oriented to person, place, and time.  Psychiatric:        Attention and Perception: Attention normal.        Mood and Affect: Affect is not inappropriate.        Speech: Speech normal.        Behavior: Behavior normal. Behavior is cooperative.     Comments: Pt attentive, conversant, appropriate           Assessment & Plan:     Encounter Diagnosis  Name Primary?   Mood disorder (HCC) Yes     -pt to Continue citalopram -encouraged pt to Learn meditation.  He can research sources on the internet.  Discussed that this would be a good option to help outlet his negative emotions -pt to Keep appt with psychiatrist at [facility]  -pt to F/u here 2-3 week.  He is to contact office sooner prn

## 2022-01-07 ENCOUNTER — Other Ambulatory Visit: Payer: Self-pay | Admitting: Physician Assistant

## 2022-01-07 MED ORDER — PANTOPRAZOLE SODIUM 40 MG PO TBEC: 40.0000 mg | DELAYED_RELEASE_TABLET | Freq: Every day | ORAL | 1 refills | Status: DC

## 2022-01-15 ENCOUNTER — Ambulatory Visit: Payer: Medicaid Other | Admitting: Physician Assistant

## 2022-01-15 ENCOUNTER — Encounter: Payer: Self-pay | Admitting: Physician Assistant

## 2022-01-15 VITALS — BP 113/70 | HR 79 | Temp 97.6°F

## 2022-01-15 DIAGNOSIS — E785 Hyperlipidemia, unspecified: Secondary | ICD-10-CM

## 2022-01-15 DIAGNOSIS — Z125 Encounter for screening for malignant neoplasm of prostate: Secondary | ICD-10-CM

## 2022-01-15 DIAGNOSIS — F39 Unspecified mood [affective] disorder: Secondary | ICD-10-CM

## 2022-01-15 DIAGNOSIS — F17200 Nicotine dependence, unspecified, uncomplicated: Secondary | ICD-10-CM

## 2022-01-15 DIAGNOSIS — I739 Peripheral vascular disease, unspecified: Secondary | ICD-10-CM

## 2022-01-15 DIAGNOSIS — M5412 Radiculopathy, cervical region: Secondary | ICD-10-CM

## 2022-01-15 DIAGNOSIS — J449 Chronic obstructive pulmonary disease, unspecified: Secondary | ICD-10-CM

## 2022-01-15 DIAGNOSIS — I10 Essential (primary) hypertension: Secondary | ICD-10-CM

## 2022-01-15 DIAGNOSIS — R7303 Prediabetes: Secondary | ICD-10-CM

## 2022-01-15 MED ORDER — NAPROXEN 500 MG PO TABS: ORAL_TABLET | ORAL | 0 refills | Status: DC

## 2022-01-15 NOTE — Progress Notes (Signed)
BP 113/70   Pulse 79   Temp 97.6 F (36.4 C)   SpO2 99%    Subjective:    Patient ID: Timothy Nielsen, male    DOB: 1971-06-12, 51 y.o.   MRN: [id number]  HPI: Timothy Nielsen is a 51 y.o. male presenting on 01/15/2022 for mood   HPI   Chief Complaint  Patient presents with   mood     Pt says his Mood is about the same.   He says he has No SI, HI.   He thinks he is gritting his teeth and there is pressure.   He says his neck pinched nerve is acting up again.   He still has episodes of getting "aggravated as hell".    His appointment with psychiatrist is august 22 at [facility].   Pt says his Neck flared up again Thursday last week.   No self- treatments.  No apap, heat, ice, nothing.    MRI c-spine 05/2021 showed mild spinal stenosis and severe neural foraminal stenosis.     He continues to smoke.  He says prednisone tends to make him angrier.     He has PAD but no hx MI and a non-ischemic myoview earlier this year.      Relevant past medical, surgical, family and social history reviewed and updated as indicated. Interim medical history since our last visit reviewed. Allergies and medications reviewed and updated.    Current Outpatient Medications:    albuterol (VENTOLIN HFA) 108 (90 Base) MCG/ACT inhaler, INHALE 2 PUFFS BY MOUTH EVERY 6 HOURS AS NEEDED FOR COUGHING, WHEEZING, OR SHORTNESS OF BREATH, Disp: 20.1 g, Rfl: 0   aspirin EC 81 MG tablet, Take 81 mg by mouth daily. Swallow whole., Disp: , Rfl:    atorvastatin (LIPITOR) 40 MG tablet, Take 1 tablet (40 mg total) by mouth daily., Disp: 90 tablet, Rfl: 1   citalopram (CELEXA) 20 MG tablet, Take 1 tablet (20 mg total) by mouth daily., Disp: 90 tablet, Rfl: 0   clopidogrel (PLAVIX) 75 MG tablet, Take 1 tablet (75 mg total) by mouth daily., Disp: 90 tablet, Rfl: 2   Cyanocobalamin 1500 MCG TBDP, Take 3,000 mcg by mouth daily., Disp: , Rfl:    fluticasone furoate-vilanterol (BREO ELLIPTA) 100-25 MCG/ACT AEPB,  Inhale 1 puff into the lungs daily., Disp: 3 each, Rfl: 3   lisinopril-hydrochlorothiazide (ZESTORETIC) 20-12.5 MG tablet, TAKE 1 Tablet BY MOUTH ONCE EVERY DAY, Disp: 90 tablet, Rfl: 0   metoprolol tartrate (LOPRESSOR) 25 MG tablet, Take 1 tablet (25 mg total) by mouth 2 (two) times daily., Disp: 180 tablet, Rfl: 1   nitroGLYCERIN (NITROSTAT) 0.4 MG SL tablet, Place 1 tablet (0.4 mg total) under the tongue every 5 (five) minutes as needed for chest pain., Disp: 25 tablet, Rfl: 3   pantoprazole (PROTONIX) 40 MG tablet, Take 1 tablet (40 mg total) by mouth daily., Disp: 30 tablet, Rfl: 1    Review of Systems  Per HPI unless specifically indicated above     Objective:    BP 113/70   Pulse 79   Temp 97.6 F (36.4 C)   SpO2 99%   Wt Readings from Last 3 Encounters:  11/27/21 270 lb 6.4 oz (122.7 kg)  11/22/21 274 lb (124.3 kg)  10/29/21 272 lb (123.4 kg)    Physical Exam Vitals reviewed.  Constitutional:      General: He is not in acute distress.    Appearance: He is well-developed. He is not ill-appearing.  HENT:     Head: Normocephalic and atraumatic.  Cardiovascular:     Rate and Rhythm: Normal rate and regular rhythm.  Pulmonary:     Effort: Pulmonary effort is normal.     Breath sounds: Normal breath sounds. No wheezing.  Abdominal:     General: Bowel sounds are normal.     Palpations: Abdomen is soft.     Tenderness: There is no abdominal tenderness.  Musculoskeletal:     Cervical back: Neck supple. No rigidity. No spinous process tenderness. Normal range of motion.     Right lower leg: No edema.     Left lower leg: No edema.  Lymphadenopathy:     Cervical: No cervical adenopathy.  Skin:    General: Skin is warm and dry.  Neurological:     Mental Status: He is alert and oriented to person, place, and time.     Motor: No weakness or tremor.  Psychiatric:        Attention and Perception: Attention normal.        Speech: Speech normal.        Behavior: Behavior  normal. Behavior is cooperative.     GAD 7 score 14 PHQ 9 score 5      Assessment & Plan:    Encounter Diagnoses  Name Primary?   Mood disorder (HCC) Yes   Cervical radiculopathy    PAD (peripheral artery disease) (HCC)    Tobacco use disorder    Chronic obstructive pulmonary disease, unspecified COPD type (HCC)    Primary hypertension    Hyperlipidemia, unspecified hyperlipidemia type    Prediabetes      -discussed with pt that he needs to see psychiatrist for evaluation and treatment.  Recommended he continue the citalopram.  The psychiatrist is best able to determine if an increase in this medication or a change to a different medication is best.  Pt is in agreement with this plan  -trial of Naproxen for 1-2 week.  He is to use Ice/heat  -discussed ways that smoking is bad for his PAD and limits the oxygen to his body.  He is encouraged to stop smoking  -pt to follow up 1 month with labs before appt.  He is to contact office sooner prn

## 2022-01-23 ENCOUNTER — Other Ambulatory Visit: Payer: Self-pay | Admitting: Physician Assistant

## 2022-01-23 DIAGNOSIS — I10 Essential (primary) hypertension: Secondary | ICD-10-CM

## 2022-01-28 ENCOUNTER — Encounter: Payer: Self-pay | Admitting: Physician Assistant

## 2022-01-28 ENCOUNTER — Ambulatory Visit: Payer: Medicaid Other | Admitting: Physician Assistant

## 2022-01-28 VITALS — BP 111/73 | HR 68 | Temp 97.8°F

## 2022-01-28 DIAGNOSIS — K0889 Other specified disorders of teeth and supporting structures: Secondary | ICD-10-CM

## 2022-01-28 DIAGNOSIS — K029 Dental caries, unspecified: Secondary | ICD-10-CM

## 2022-01-28 MED ORDER — AMOXICILLIN 500 MG PO CAPS: 500.0000 mg | ORAL_CAPSULE | Freq: Three times a day (TID) | ORAL | 0 refills | Status: AC

## 2022-01-28 MED ORDER — CITALOPRAM HYDROBROMIDE 20 MG PO TABS: 20.0000 mg | ORAL_TABLET | Freq: Every day | ORAL | 0 refills | Status: DC

## 2022-01-28 NOTE — Progress Notes (Signed)
BP 111/73   Pulse 68   Temp 97.8 F (36.6 C)   SpO2 99%    Subjective:    Patient ID: Timothy Nielsen, male    DOB: March 06, 1971, 51 y.o.   MRN: [id number]  HPI: Timothy Nielsen is a 51 y.o. male presenting on 01/28/2022 for No chief complaint on file.   HPI  Pt is in today  c/o dentalgia.    Upper front teeth hurting since Friday Today upper back/lower bak hurting  Pt denies fever, face swelling.    Relevant past medical, surgical, family and social history reviewed and updated as indicated. Interim medical history since our last visit reviewed. Allergies and medications reviewed and updated.    Current Outpatient Medications:    albuterol (VENTOLIN HFA) 108 (90 Base) MCG/ACT inhaler, INHALE 2 PUFFS BY MOUTH EVERY 6 HOURS AS NEEDED FOR COUGHING, WHEEZING, OR SHORTNESS OF BREATH, Disp: 20.1 g, Rfl: 0   aspirin EC 81 MG tablet, Take 81 mg by mouth daily. Swallow whole., Disp: , Rfl:    atorvastatin (LIPITOR) 40 MG tablet, Take 1 tablet (40 mg total) by mouth daily., Disp: 90 tablet, Rfl: 1   citalopram (CELEXA) 20 MG tablet, Take 1 tablet (20 mg total) by mouth daily., Disp: 90 tablet, Rfl: 0   clopidogrel (PLAVIX) 75 MG tablet, Take 1 tablet (75 mg total) by mouth daily., Disp: 90 tablet, Rfl: 2   Cyanocobalamin 1500 MCG TBDP, Take 3,000 mcg by mouth daily., Disp: , Rfl:    fluticasone furoate-vilanterol (BREO ELLIPTA) 100-25 MCG/ACT AEPB, Inhale 1 puff into the lungs daily., Disp: 3 each, Rfl: 3   lisinopril-hydrochlorothiazide (ZESTORETIC) 20-12.5 MG tablet, TAKE 1 Tablet BY MOUTH ONCE EVERY DAY, Disp: 90 tablet, Rfl: 0   metoprolol tartrate (LOPRESSOR) 25 MG tablet, Take 1 tablet (25 mg total) by mouth 2 (two) times daily., Disp: 180 tablet, Rfl: 1   naproxen (NAPROSYN) 500 MG tablet, 1 po bid with meals for one to two weeks, Disp: 28 tablet, Rfl: 0   nitroGLYCERIN (NITROSTAT) 0.4 MG SL tablet, Place 1 tablet (0.4 mg total) under the tongue every 5 (five) minutes as needed for chest  pain., Disp: 25 tablet, Rfl: 3   pantoprazole (PROTONIX) 40 MG tablet, Take 1 tablet (40 mg total) by mouth daily., Disp: 30 tablet, Rfl: 1  Review of Systems  Per HPI unless specifically indicated above     Objective:    BP 111/73   Pulse 68   Temp 97.8 F (36.6 C)   SpO2 99%   Wt Readings from Last 3 Encounters:  11/27/21 270 lb 6.4 oz (122.7 kg)  11/22/21 274 lb (124.3 kg)  10/29/21 272 lb (123.4 kg)    Physical Exam Constitutional:      General: He is not in acute distress.    Appearance: He is not toxic-appearing.  HENT:     Head: Normocephalic and atraumatic.     Jaw: No trismus or swelling.     Comments: No swelling of face.    Mouth/Throat:     Mouth: Mucous membranes are moist.     Dentition: Abnormal dentition. Dental tenderness and dental caries present. No dental abscesses.      Comments: Left upper front teeth (1&2) have areas of decay and blackness.  No abscess seen.  Pulmonary:     Effort: Pulmonary effort is normal. No respiratory distress.  Neurological:     Mental Status: He is alert and oriented to person, place, and time.  Psychiatric:  Behavior: Behavior normal.           Assessment & Plan:   Encounter Diagnoses  Name Primary?   Dentalgia Yes   Dental decay       Rx amoxil.  Encouraged dental hygiene.  Pt is placed on dental list.  Pt has routine follow up later this month

## 2022-02-06 ENCOUNTER — Telehealth: Payer: Self-pay

## 2022-02-06 NOTE — Telephone Encounter (Signed)
Called client as a return call to schedule to renew care Connect. No answer, left message.  Francee Nodal RN [employer]

## 2022-02-15 ENCOUNTER — Other Ambulatory Visit (HOSPITAL_COMMUNITY)
Admission: RE | Admit: 2022-02-15 | Discharge: 2022-02-15 | Disposition: A | Discharge status: Home / Self Care | Payer: Medicaid Other | Source: Ambulatory Visit | Attending: Physician Assistant | Admitting: Physician Assistant

## 2022-02-15 DIAGNOSIS — Z125 Encounter for screening for malignant neoplasm of prostate: Secondary | ICD-10-CM | POA: Insufficient documentation

## 2022-02-15 DIAGNOSIS — E785 Hyperlipidemia, unspecified: Secondary | ICD-10-CM | POA: Insufficient documentation

## 2022-02-15 DIAGNOSIS — R7303 Prediabetes: Secondary | ICD-10-CM | POA: Insufficient documentation

## 2022-02-15 DIAGNOSIS — I10 Essential (primary) hypertension: Secondary | ICD-10-CM | POA: Insufficient documentation

## 2022-02-15 DIAGNOSIS — I739 Peripheral vascular disease, unspecified: Secondary | ICD-10-CM | POA: Insufficient documentation

## 2022-02-15 LAB — COMPREHENSIVE METABOLIC PANEL
ALT: 53 U/L — ABNORMAL HIGH (ref 0–44)
AST: 29 U/L (ref 15–41)
Albumin: 4.2 g/dL (ref 3.5–5.0)
Alkaline Phosphatase: 105 U/L (ref 38–126)
Anion gap: 6 (ref 5–15)
BUN: 9 mg/dL (ref 6–20)
CO2: 27 mmol/L (ref 22–32)
Calcium: 9.3 mg/dL (ref 8.9–10.3)
Chloride: 103 mmol/L (ref 98–111)
Creatinine, Ser: 0.79 mg/dL (ref 0.61–1.24)
GFR, Estimated: >60 mL/min (ref >60)
Glucose, Bld: 125 mg/dL — ABNORMAL HIGH (ref 70–99)
Potassium: 4.1 mmol/L (ref 3.5–5.1)
Sodium: 136 mmol/L (ref 135–145)
Total Bilirubin: 1 mg/dL (ref 0.3–1.2)
Total Protein: 7.2 g/dL (ref 6.5–8.1)

## 2022-02-15 LAB — LIPID PANEL
Cholesterol: 162 mg/dL (ref 0–200)
HDL: 36 mg/dL — ABNORMAL LOW (ref >40)
LDL Cholesterol: 93 mg/dL (ref 0–99)
Total CHOL/HDL Ratio: 4.5 RATIO
Triglycerides: 166 mg/dL — ABNORMAL HIGH (ref <150)
VLDL: 33 mg/dL (ref 0–40)

## 2022-02-15 LAB — PSA: Prostatic Specific Antigen: 0.8 ng/mL (ref 0.00–4.00)

## 2022-02-18 ENCOUNTER — Ambulatory Visit: Payer: Medicaid Other | Admitting: Physician Assistant

## 2022-02-18 ENCOUNTER — Encounter: Payer: Self-pay | Admitting: Physician Assistant

## 2022-02-18 VITALS — BP 127/82 | HR 79 | Temp 97.9°F

## 2022-02-18 DIAGNOSIS — F17200 Nicotine dependence, unspecified, uncomplicated: Secondary | ICD-10-CM

## 2022-02-18 DIAGNOSIS — J449 Chronic obstructive pulmonary disease, unspecified: Secondary | ICD-10-CM

## 2022-02-18 DIAGNOSIS — I10 Essential (primary) hypertension: Secondary | ICD-10-CM

## 2022-02-18 DIAGNOSIS — G8929 Other chronic pain: Secondary | ICD-10-CM

## 2022-02-18 DIAGNOSIS — I739 Peripheral vascular disease, unspecified: Secondary | ICD-10-CM

## 2022-02-18 DIAGNOSIS — E785 Hyperlipidemia, unspecified: Secondary | ICD-10-CM

## 2022-02-18 DIAGNOSIS — F39 Unspecified mood [affective] disorder: Secondary | ICD-10-CM

## 2022-02-18 MED ORDER — CLOPIDOGREL BISULFATE 75 MG PO TABS
75.0000 mg | ORAL_TABLET | Freq: Every day | ORAL | 0 refills | Status: DC
Start: 2022-02-18 — End: 2022-06-26

## 2022-02-18 MED ORDER — ALBUTEROL SULFATE HFA 108 (90 BASE) MCG/ACT IN AERS: INHALATION_SPRAY | RESPIRATORY_TRACT | 0 refills | Status: DC

## 2022-02-18 MED ORDER — ATORVASTATIN CALCIUM 80 MG PO TABS: 80.0000 mg | ORAL_TABLET | Freq: Every day | ORAL | 0 refills | Status: DC

## 2022-02-18 MED ORDER — LISINOPRIL-HYDROCHLOROTHIAZIDE 20-12.5 MG PO TABS
ORAL_TABLET | ORAL | 0 refills | Status: DC
Start: 2022-02-18 — End: 2022-07-29

## 2022-02-18 MED ORDER — METOPROLOL TARTRATE 25 MG PO TABS
25.0000 mg | ORAL_TABLET | Freq: Two times a day (BID) | ORAL | 1 refills | Status: DC
Start: 2022-02-18 — End: 2022-12-30

## 2022-02-18 NOTE — Progress Notes (Signed)
BP 127/82   Pulse 79   Temp 97.9 F (36.6 C)   SpO2 98%    Subjective:    Patient ID: Timothy Nielsen, male    DOB: [birth year], 51 y.o.   MRN: [id number]  HPI: Timothy Nielsen is a 51 y.o. male presenting on 02/18/2022 for Hypertension, Hyperlipidemia, Gastroesophageal Reflux, and COPD   HPI    Chief Complaint  Patient presents with   Hypertension   Hyperlipidemia   Gastroesophageal Reflux   COPD    He is smoking.  He says he has No CP.  He says his breathing is stable  Pt c/o L knee pain.  He started using naproxen for the pain.  He says he has been hurting couple months Consistently and Hurting off and on for a year.    He says he has cafa    Relevant past medical, surgical, family and social history reviewed and updated as indicated. Interim medical history since our last visit reviewed. Allergies and medications reviewed and updated.    Current Outpatient Medications:    albuterol (VENTOLIN HFA) 108 (90 Base) MCG/ACT inhaler, INHALE 2 PUFFS BY MOUTH EVERY 6 HOURS AS NEEDED FOR COUGHING, WHEEZING, OR SHORTNESS OF BREATH, Disp: 20.1 g, Rfl: 0   aspirin EC 81 MG tablet, Take 81 mg by mouth daily. Swallow whole., Disp: , Rfl:    atorvastatin (LIPITOR) 40 MG tablet, Take 1 tablet (40 mg total) by mouth daily., Disp: 90 tablet, Rfl: 1   citalopram (CELEXA) 20 MG tablet, Take 1 tablet (20 mg total) by mouth daily., Disp: 30 tablet, Rfl: 0   clopidogrel (PLAVIX) 75 MG tablet, Take 75 mg by mouth daily., Disp: , Rfl:    Cyanocobalamin 1500 MCG TBDP, Take 3,000 mcg by mouth daily., Disp: , Rfl:    fluticasone furoate-vilanterol (BREO ELLIPTA) 100-25 MCG/ACT AEPB, Inhale 1 puff into the lungs daily., Disp: 3 each, Rfl: 3   lisinopril-hydrochlorothiazide (ZESTORETIC) 20-12.5 MG tablet, TAKE 1 Tablet BY MOUTH ONCE EVERY DAY, Disp: 90 tablet, Rfl: 0   metoprolol tartrate (LOPRESSOR) 25 MG tablet, Take 1 tablet (25 mg total) by mouth 2 (two) times daily., Disp: 180 tablet, Rfl: 1    naproxen (NAPROSYN) 500 MG tablet, 1 po bid with meals for one to two weeks, Disp: 28 tablet, Rfl: 0   pantoprazole (PROTONIX) 40 MG tablet, Take 1 tablet (40 mg total) by mouth daily., Disp: 30 tablet, Rfl: 1   nitroGLYCERIN (NITROSTAT) 0.4 MG SL tablet, Place 1 tablet (0.4 mg total) under the tongue every 5 (five) minutes as needed for chest pain. (Patient not taking: Reported on 02/18/2022), Disp: 25 tablet, Rfl: 3   Review of Systems  Per HPI unless specifically indicated above     Objective:    BP 127/82   Pulse 79   Temp 97.9 F (36.6 C)   SpO2 98%   Wt Readings from Last 3 Encounters:  11/27/21 270 lb 6.4 oz (122.7 kg)  11/22/21 274 lb (124.3 kg)  10/29/21 272 lb (123.4 kg)    Physical Exam Vitals reviewed.  Constitutional:      General: He is not in acute distress.    Appearance: He is well-developed. He is not ill-appearing.  HENT:     Head: Normocephalic and atraumatic.  Cardiovascular:     Rate and Rhythm: Normal rate and regular rhythm.     Pulses:          Dorsalis pedis pulses are 2+ on the right side and  2+ on the left side.  Pulmonary:     Effort: Pulmonary effort is normal.     Breath sounds: Normal breath sounds. No wheezing.  Abdominal:     General: Bowel sounds are normal.     Palpations: Abdomen is soft.     Tenderness: There is no abdominal tenderness.  Musculoskeletal:     Cervical back: Neck supple.     Left knee: Crepitus present. No swelling, erythema or bony tenderness. Normal range of motion. No tenderness.     Right lower leg: No edema.     Left lower leg: No edema.  Lymphadenopathy:     Cervical: No cervical adenopathy.  Skin:    General: Skin is warm and dry.  Neurological:     Mental Status: He is alert and oriented to person, place, and time.  Psychiatric:        Behavior: Behavior normal.     Results for orders placed or performed during the hospital encounter of 02/15/22  Comprehensive metabolic panel  Result Value Ref Range    Sodium 136 135 - 145 mmol/L   Potassium 4.1 3.5 - 5.1 mmol/L   Chloride 103 98 - 111 mmol/L   CO2 27 22 - 32 mmol/L   Glucose, Bld 125 (H) 70 - 99 mg/dL   BUN 9 6 - 20 mg/dL   Creatinine, Ser 6.50 0.61 - 1.24 mg/dL   Calcium 9.3 8.9 - 35.4 mg/dL   Total Protein 7.2 6.5 - 8.1 g/dL   Albumin 4.2 3.5 - 5.0 g/dL   AST 29 15 - 41 U/L   ALT 53 (H) 0 - 44 U/L   Alkaline Phosphatase 105 38 - 126 U/L   Total Bilirubin 1.0 0.3 - 1.2 mg/dL   GFR, Estimated >65 >68 mL/min   Anion gap 6 5 - 15  Lipid panel  Result Value Ref Range   Cholesterol 162 0 - 200 mg/dL   Triglycerides 127 (H) <150 mg/dL   HDL 36 (L) >51 mg/dL   Total CHOL/HDL Ratio 4.5 RATIO   VLDL 33 0 - 40 mg/dL   LDL Cholesterol 93 0 - 99 mg/dL  PSA  Result Value Ref Range   Prostatic Specific Antigen 0.80 0.00 - 4.00 ng/mL      Assessment & Plan:   Encounter Diagnoses  Name Primary?   Primary hypertension Yes   Chronic pain of left knee    PAD (peripheral artery disease) (HCC)    Hyperlipidemia, unspecified hyperlipidemia type    Chronic obstructive pulmonary disease, unspecified COPD type (HCC)    Tobacco use disorder    Mood disorder (HCC)     dyslipidemia -Reviewed labs with pt  -Increase lipitor in light of smoking and vascular disease.  He is encouraged to follow lowfat diet  PAD -increase lipitor -encouraged smoking cessation -he is on plavix and asa  L knee pain -pt encouraged to Limit naproxen due to GI issues.  Ice the knee 10-20 minutes 3 or 4 times daily to help.   Will get  Xray.  He says he has cafa  Anxiety/depression -he has follow up appointment with MH at [facility] clinic  F/u 3 months.  He is to contact office sooner prn

## 2022-02-22 ENCOUNTER — Ambulatory Visit (HOSPITAL_COMMUNITY)
Admission: RE | Admit: 2022-02-22 | Discharge: 2022-02-22 | Disposition: A | Discharge status: Home / Self Care | Payer: Medicaid Other | Source: Ambulatory Visit | Attending: Physician Assistant | Admitting: Physician Assistant

## 2022-02-22 DIAGNOSIS — G8929 Other chronic pain: Secondary | ICD-10-CM | POA: Insufficient documentation

## 2022-02-22 DIAGNOSIS — M25562 Pain in left knee: Secondary | ICD-10-CM | POA: Insufficient documentation

## 2022-02-27 ENCOUNTER — Other Ambulatory Visit: Payer: Self-pay | Admitting: Physician Assistant

## 2022-02-27 MED ORDER — FLUTICASONE FUROATE-VILANTEROL 100-25 MCG/ACT IN AEPB: 1.0000 | INHALATION_SPRAY | Freq: Every day | RESPIRATORY_TRACT | 3 refills | Status: DC

## 2022-03-14 ENCOUNTER — Other Ambulatory Visit: Payer: Self-pay | Admitting: Physician Assistant

## 2022-03-14 MED ORDER — PANTOPRAZOLE SODIUM 40 MG PO TBEC: 40.0000 mg | DELAYED_RELEASE_TABLET | Freq: Every day | ORAL | 1 refills | Status: DC

## 2022-03-19 ENCOUNTER — Encounter: Payer: Self-pay | Admitting: Cardiovascular Disease

## 2022-03-19 ENCOUNTER — Ambulatory Visit: Payer: Medicaid Other | Attending: Cardiovascular Disease | Admitting: Cardiovascular Disease

## 2022-03-19 DIAGNOSIS — I739 Peripheral vascular disease, unspecified: Secondary | ICD-10-CM

## 2022-03-19 NOTE — Assessment & Plan Note (Signed)
Mr. Ballen was referred to me by Dr. Domenic Polite, his cardiologist for PAD.  I performed peripheral angiography and endovascular therapy on his right SFA 10/29/2021.  He had directional atherectomy followed by [facility] with improvement of his right ABI from 0.65 up to 0.80.  He had three-vessel runoff.  His claudication resolved.  His major complaints are of pain in both feet from the midfoot to his toes which sounds neuropathic.  The claudication that he was experiencing in his calf continues to remain improved.  We will check lower extremity arterial Dopplers again in November.

## 2022-03-19 NOTE — Patient Instructions (Signed)
Medication Instructions:  Your physician recommends that you continue on your current medications as directed. Please refer to the Current Medication list given to you today.  *If you need a refill on your cardiac medications before your next appointment, please call your pharmacy*   Testing/Procedures: Your physician has requested that you have a lower extremity arterial duplex. This test is an ultrasound of the arteries in the legs. It looks at arterial blood flow in the legs. Allow one hour for Lower Arterial scans. There are no restrictions or special instructions To be done in November. This procedure will be done at [place]. [address]  Your physician has requested that you have an ankle brachial index (ABI). During this test an ultrasound and blood pressure cuff are used to evaluate the arteries that supply the arms and legs with blood. Allow thirty minutes for this exam. There are no restrictions or special instructions. To be done in November. This procedure will be done at [place]. [address]    Follow-Up: At [facility], you and your health needs are our priority.  As part of our continuing mission to provide you with exceptional heart care, we have created designated Provider Care Teams.  These Care Teams include your primary Cardiologist (physician) and Advanced Practice Providers (APPs -  Physician Assistants and Nurse Practitioners) who all work together to provide you with the care you need, when you need it.  We recommend signing up for the patient portal called "MyChart".  Sign up information is provided on this After Visit Summary.  MyChart is used to connect with patients for Virtual Visits (Telemedicine).  Patients are able to view lab/test results, encounter notes, upcoming appointments, etc.  Non-urgent messages can be sent to your provider as well.   To learn more about what you can do with MyChart, go to [email or web address].    Your next  appointment:   12 month(s)  The format for your next appointment:   In Person  Provider:   [name], MD

## 2022-03-19 NOTE — Progress Notes (Signed)
03/19/2022 Timothy Nielsen   12-18-1970  [id number]  Primary Physician Timothy Dryer, PA-C Primary Cardiologist: Timothy Harp MD Timothy Nielsen, [place]  HPI:  Timothy Nielsen is a 51 y.o.  mildly overweight divorced Caucasian male with no children who is currently unemployed but previously worked for a [employer].  He was referred through the courtesy of Dr. Domenic Nielsen, his cardiologist, for evaluation of lifestyle-limiting claudication.  I last saw him in the office 11/27/2021.  His risk factors include 35 pack years of tobacco abuse currently smoking 1/2 pack/day with the intent to quit.  He has treated hypertension and hyperlipidemia.  Both his parents had coronary intervention.  He is never had a heart attack or stroke.  He does complain of shortness of breath and occasional chest tightness.  He has COPD.  Recent had a 2D echo that was normal and a Myoview stress test that was nonischemic.  He has had lifestyle-limiting claudication for at least a year right greater than left.  He had Doppler studies performed at [facility] 07/26/2021 revealing right ABI of 0.69 and a left of 1.16.  I performed lower extreme arterial Doppler studies on him 10/04/2021 revealing a right ABI of 0.65 with an occluded right SFA.  I ultimately performed peripheral angiography and endovascular therapy on him 10/29/2021.  He had a right SFA CTO and had directional atherectomy followed by drug-coated balloon angioplasty.  He did have a small hematoma in his left groin which is resolving.  His Doppler studies performed 11/15/2021 revealed a widely patent right SFA.  His ABI increased from 0.65-0.80.  His claudication has significantly improved.  He did stop smoking briefly but has gone back to smoking 4 to 6 cigarettes a day.  Since I saw him over 3 months ago he continues to enjoy the absence of right calf claudication although he does complain of some pain in the distal aspects of his feet bilaterally which sounds  neuropathic.  He has gone back to smoking.  He denies chest pain or shortness of breath.  Current Meds  Medication Sig   albuterol (VENTOLIN HFA) 108 (90 Base) MCG/ACT inhaler INHALE 2 PUFFS BY MOUTH EVERY 6 HOURS AS NEEDED FOR COUGHING, WHEEZING, OR SHORTNESS OF BREATH   aspirin EC 81 MG tablet Take 81 mg by mouth daily. Swallow whole.   atorvastatin (LIPITOR) 80 MG tablet Take 1 tablet (80 mg total) by mouth daily.   citalopram (CELEXA) 20 MG tablet Take 1 tablet (20 mg total) by mouth daily.   clopidogrel (PLAVIX) 75 MG tablet Take 1 tablet (75 mg total) by mouth daily.   Cyanocobalamin 1500 MCG TBDP Take 3,000 mcg by mouth daily.   fluticasone furoate-vilanterol (BREO ELLIPTA) 100-25 MCG/ACT AEPB Inhale 1 puff into the lungs daily.   lisinopril-hydrochlorothiazide (ZESTORETIC) 20-12.5 MG tablet TAKE 1 Tablet BY MOUTH ONCE EVERY DAY   metoprolol tartrate (LOPRESSOR) 25 MG tablet Take 1 tablet (25 mg total) by mouth 2 (two) times daily.   naproxen (NAPROSYN) 500 MG tablet 1 po bid with meals for one to two weeks   nitroGLYCERIN (NITROSTAT) 0.4 MG SL tablet Place 1 tablet (0.4 mg total) under the tongue every 5 (five) minutes as needed for chest pain.   pantoprazole (PROTONIX) 40 MG tablet Take 1 tablet (40 mg total) by mouth daily.   traZODone (DESYREL) 50 MG tablet Take 50 mg by mouth.     No Known Allergies  Social History   Socioeconomic History  Marital status: Single    Spouse name: Not on file   Number of children: 0   Years of education: Not on file   Highest education level: Not on file  Occupational History   Not on file  Tobacco Use   Smoking status: Every Day    Packs/day: 0.25    Years: 15.00    Total pack years: 3.75    Types: Cigarettes    Start date: 11/14/1986   Smokeless tobacco: Never  Vaping Use   Vaping Use: Never used  Substance and Sexual Activity   Alcohol use: Yes    Alcohol/week: 18.0 standard drinks of alcohol    Types: 18 Cans of beer per week     Comment: 12 pack a week   Drug use: No   Sexual activity: Not on file  Other Topics Concern   Not on file  Social History Narrative   Right Handed    Lives in a one story home    Social Determinants of Health   Financial Resource Strain: Not on file  Food Insecurity: Not on file  Transportation Needs: Not on file  Physical Activity: Not on file  Stress: Not on file  Social Connections: Not on file  Intimate Partner Violence: Not on file     Review of Systems: General: negative for chills, fever, night sweats or weight changes.  Cardiovascular: negative for chest pain, dyspnea on exertion, edema, orthopnea, palpitations, paroxysmal nocturnal dyspnea or shortness of breath Dermatological: negative for rash Respiratory: negative for cough or wheezing Urologic: negative for hematuria Abdominal: negative for nausea, vomiting, diarrhea, bright red blood per rectum, melena, or hematemesis Neurologic: negative for visual changes, syncope, or dizziness All other systems reviewed and are otherwise negative except as noted above.    Blood pressure 118/78, pulse 72, height 6\' 6"  (1.981 m), weight 260 lb 12.8 oz (118.3 kg), SpO2 99 %.  General appearance: alert and no distress Neck: no adenopathy, no carotid bruit, no JVD, supple, symmetrical, trachea midline, and thyroid not enlarged, symmetric, no tenderness/mass/nodules Lungs: clear to auscultation bilaterally Heart: regular rate and rhythm, S1, S2 normal, no murmur, click, rub or gallop Extremities: extremities normal, atraumatic, no cyanosis or edema Pulses: 2+ and symmetric Skin: Skin color, texture, turgor normal. No rashes or lesions Neurologic: Grossly normal  EKG not performed today  ASSESSMENT AND PLAN:   Peripheral arterial disease [facility]) Timothy Nielsen was referred to me by Dr. Domenic Nielsen, his cardiologist for PAD.  I performed peripheral angiography and endovascular therapy on his right SFA 10/29/2021.  He had directional  atherectomy followed by [facility] with improvement of his right ABI from 0.65 up to 0.80.  He had three-vessel runoff.  His claudication resolved.  His major complaints are of pain in both feet from the midfoot to his toes which sounds neuropathic.  The claudication that he was experiencing in his calf continues to remain improved.  We will check lower extremity arterial Dopplers again in November.     Timothy Harp MD FACP,FACC,FAHA, [facility] 03/19/2022 9:40 AM

## 2022-03-21 ENCOUNTER — Telehealth: Payer: Self-pay | Admitting: Licensed Clinical Social Worker

## 2022-03-21 NOTE — Progress Notes (Signed)
  Heart and Vascular Care Navigation  03/21/2022  Timothy Nielsen March 06, 1971 [id number]  Reason for Referral:  Patient is participating in a Managed Medicaid Plan: No, self pay, CAFA approved  Engaged with patient by telephone for initial visit for Heart and Vascular Care Coordination.                                                                                                   Assessment:  LCSW was able to reach pt this morning at [phone]. Introduced self, role, reason for call. Pt confirmed home, address and PCP. Pt is active with [employer] and approved for [facility] Financial Assistance through 04/04/22. He shares they will help him with renewing when needed. LCSW encouraged pt to reach back out to LCSW if any additional questions/concerns.                                       HRT/VAS Care Coordination     Patients Home Cardiology Office [facility]   Outpatient Care Team Social Worker   Social Worker Name: Westley Hummer, LCSW, [facility]   Living arrangements for the past 2 months Single Family Home   Lives with: Self   Patient Current Insurance Coverage [facility] Assistance   Patient Has Concern With Paying Medical Bills No   Does Patient Have Prescription Coverage? No   Patient Prescription Assistance Programs [facility]   [place] [facility] Medications uses [facility] for some medications       Social History:                                                                             SDOH Screenings   Food Insecurity: No Food Insecurity (03/21/2022)  Housing: Low Risk  (03/21/2022)  Transportation Needs: No Transportation Needs (03/21/2022)  Utilities: Not At Risk (03/21/2022)  Depression (PHQ2-9): Medium Risk (01/15/2022)  Financial Resource Strain: Medium Risk (03/21/2022)  Tobacco Use: High Risk (03/19/2022)    SDOH Interventions: Financial Resources:  Financial Strain Interventions: Intervention Not Indicated (already connected w/ Government social research officer) Building control surveyor for [place] Insecurity:  Food Insecurity Interventions: Intervention Not Indicated  Housing Insecurity:  Housing Interventions: Intervention Not Indicated  Transportation:   Transportation Interventions: Intervention Not Indicated    Other Care Navigation Interventions:     Provided Pharmacy assistance resources [facility]   Follow-up plan:   LCSW reached out to pt, no additional needs at this time, encouraged him to reach out if any questions/concerns arise that we can assist with.

## 2022-03-27 ENCOUNTER — Ambulatory Visit: Payer: Medicaid Other | Admitting: Physician Assistant

## 2022-03-27 ENCOUNTER — Encounter: Payer: Self-pay | Admitting: Physician Assistant

## 2022-03-27 VITALS — BP 116/70 | HR 76 | Temp 97.0°F | Ht 78.0 in | Wt 261.8 lb

## 2022-03-27 DIAGNOSIS — G5793 Unspecified mononeuropathy of bilateral lower limbs: Secondary | ICD-10-CM

## 2022-03-27 DIAGNOSIS — F17200 Nicotine dependence, unspecified, uncomplicated: Secondary | ICD-10-CM

## 2022-03-27 DIAGNOSIS — I739 Peripheral vascular disease, unspecified: Secondary | ICD-10-CM

## 2022-03-27 DIAGNOSIS — E785 Hyperlipidemia, unspecified: Secondary | ICD-10-CM

## 2022-03-27 MED ORDER — GABAPENTIN 300 MG PO CAPS: ORAL_CAPSULE | ORAL | 1 refills | Status: DC

## 2022-03-27 NOTE — Progress Notes (Signed)
BP 116/70   Pulse 76   Temp (!) 97 F (36.1 C)   Wt 261 lb 12 oz (118.7 kg)   SpO2 96%   BMI 30.25 kg/m    Subjective:    Patient ID: Timothy Nielsen, male    DOB: Nov 28, 1970, 51 y.o.   MRN: [id number]  HPI: Timothy Nielsen is a 51 y.o. male presenting on 03/27/2022 for Foot Pain   HPI   Chief Complaint  Patient presents with   Foot Pain      Pt c/o foot pain.  Vasc surg rec he see his PCP becase pain sounds neuropathic.   C/o pain both feet from the midfoot to his toes.   He has appt to rechec doppler in November.    His lipitor was inc to 80mg  but he is still taking the 40mg   Rieght foot hurts worse.   Toes numb all the tie.  Better in the morning.  Worse if he's up on them.    By nighttime they hurt- feel frostbitten, like they're burning.     He takes naproxen usu qd for his knee pain    Relevant past medical, surgical, family and social history reviewed and updated as indicated. Interim medical history since our last visit reviewed. Allergies and medications reviewed and updated.   Current Outpatient Medications:    albuterol (VENTOLIN HFA) 108 (90 Base) MCG/ACT inhaler, INHALE 2 PUFFS BY MOUTH EVERY 6 HOURS AS NEEDED FOR COUGHING, WHEEZING, OR SHORTNESS OF BREATH, Disp: 3 each, Rfl: 0   aspirin EC 81 MG tablet, Take 81 mg by mouth daily. Swallow whole., Disp: , Rfl:    atorvastatin (LIPITOR) 40 MG tablet, Take 40 mg by mouth daily., Disp: , Rfl:    clopidogrel (PLAVIX) 75 MG tablet, Take 1 tablet (75 mg total) by mouth daily., Disp: 90 tablet, Rfl: 0   Cyanocobalamin 1500 MCG TBDP, Take 3,000 mcg by mouth in the morning and at bedtime., Disp: , Rfl:    fluticasone furoate-vilanterol (BREO ELLIPTA) 100-25 MCG/ACT AEPB, Inhale 1 puff into the lungs daily., Disp: 3 each, Rfl: 3   lisinopril-hydrochlorothiazide (ZESTORETIC) 20-12.5 MG tablet, TAKE 1 Tablet BY MOUTH ONCE EVERY DAY, Disp: 90 tablet, Rfl: 0   metoprolol tartrate (LOPRESSOR) 25 MG tablet, Take 1 tablet (25 mg  total) by mouth 2 (two) times daily., Disp: 180 tablet, Rfl: 1   naproxen (NAPROSYN) 500 MG tablet, 1 po bid with meals for one to two weeks, Disp: 28 tablet, Rfl: 0   nitroGLYCERIN (NITROSTAT) 0.4 MG SL tablet, Place 1 tablet (0.4 mg total) under the tongue every 5 (five) minutes as needed for chest pain., Disp: 25 tablet, Rfl: 3   pantoprazole (PROTONIX) 40 MG tablet, Take 1 tablet (40 mg total) by mouth daily., Disp: 30 tablet, Rfl: 1   sertraline (ZOLOFT) 50 MG tablet, Take 50 mg by mouth daily., Disp: , Rfl:    traZODone (DESYREL) 50 MG tablet, Take 25-50 mg by mouth at bedtime as needed for sleep., Disp: , Rfl:    atorvastatin (LIPITOR) 80 MG tablet, Take 1 tablet (80 mg total) by mouth daily. (Patient not taking: Reported on 03/27/2022), Disp: 90 tablet, Rfl: 0    Review of Systems  Per HPI unless specifically indicated above     Objective:    BP 116/70   Pulse 76   Temp (!) 97 F (36.1 C)   Wt 261 lb 12 oz (118.7 kg)   SpO2 96%   BMI 30.25 kg/m  Wt Readings from Last 3 Encounters:  03/27/22 261 lb 12 oz (118.7 kg)  03/19/22 260 lb 12.8 oz (118.3 kg)  11/27/21 270 lb 6.4 oz (122.7 kg)    Physical Exam Vitals reviewed.  Constitutional:      General: He is not in acute distress.    Appearance: He is well-developed. He is not ill-appearing.  HENT:     Head: Normocephalic and atraumatic.  Cardiovascular:     Rate and Rhythm: Normal rate and regular rhythm.     Pulses:          Dorsalis pedis pulses are 1+ on the right side and 2+ on the left side.       Posterior tibial pulses are 2+ on the right side and 2+ on the left side.  Pulmonary:     Effort: Pulmonary effort is normal.     Breath sounds: Normal breath sounds. No wheezing.  Abdominal:     General: Bowel sounds are normal.     Palpations: Abdomen is soft.     Tenderness: There is no abdominal tenderness.  Musculoskeletal:     Cervical back: Neck supple.     Right foot: Normal range of motion. No deformity  or foot drop.     Left foot: Normal range of motion. No deformity or foot drop.  Feet:     Right foot:     Protective Sensation: 8 sites tested.  2 sites sensed.     Skin integrity: No skin breakdown.     Left foot:     Protective Sensation: 8 sites tested.  2 sites sensed.     Skin integrity: Callus present. No skin breakdown.  Lymphadenopathy:     Cervical: No cervical adenopathy.  Skin:    General: Skin is warm and dry.  Neurological:     Mental Status: He is alert and oriented to person, place, and time.  Psychiatric:        Behavior: Behavior normal.            Assessment & Plan:   Encounter Diagnoses  Name Primary?   Neuropathy of both feet Yes   PAD (peripheral artery disease) (HCC)    Tobacco use disorder    Hyperlipidemia, unspecified hyperlipidemia type       -pt counseled to get on the atorvastatin 80mg  (he needs to check his bottles) -encouraged smoking cessation -rx neurontin.  Discussed with pt it is not available through [facility].  He was given goodrx coupon -encouraged flu shot tomorrow (at flu shot clinic) but he declines -pt to follow up November as scheduled.  He is to contact office sooner prn

## 2022-04-30 ENCOUNTER — Other Ambulatory Visit: Payer: Self-pay | Admitting: Physician Assistant

## 2022-04-30 DIAGNOSIS — G5793 Unspecified mononeuropathy of bilateral lower limbs: Secondary | ICD-10-CM

## 2022-04-30 DIAGNOSIS — I739 Peripheral vascular disease, unspecified: Secondary | ICD-10-CM

## 2022-04-30 DIAGNOSIS — I10 Essential (primary) hypertension: Secondary | ICD-10-CM

## 2022-04-30 DIAGNOSIS — E785 Hyperlipidemia, unspecified: Secondary | ICD-10-CM

## 2022-04-30 DIAGNOSIS — R7303 Prediabetes: Secondary | ICD-10-CM

## 2022-05-08 ENCOUNTER — Ambulatory Visit (HOSPITAL_COMMUNITY): Admission: RE | Admit: 2022-05-08 | Payer: Medicaid Other | Source: Ambulatory Visit

## 2022-05-21 ENCOUNTER — Ambulatory Visit: Payer: Medicaid Other | Admitting: Physician Assistant

## 2022-05-21 ENCOUNTER — Encounter: Payer: Self-pay | Admitting: Physician Assistant

## 2022-05-21 VITALS — BP 130/78 | HR 74 | Temp 97.2°F | Ht 78.0 in | Wt 256.0 lb

## 2022-05-21 DIAGNOSIS — J449 Chronic obstructive pulmonary disease, unspecified: Secondary | ICD-10-CM

## 2022-05-21 DIAGNOSIS — F17200 Nicotine dependence, unspecified, uncomplicated: Secondary | ICD-10-CM

## 2022-05-21 DIAGNOSIS — I10 Essential (primary) hypertension: Secondary | ICD-10-CM

## 2022-05-21 DIAGNOSIS — R7303 Prediabetes: Secondary | ICD-10-CM

## 2022-05-21 DIAGNOSIS — E785 Hyperlipidemia, unspecified: Secondary | ICD-10-CM

## 2022-05-21 DIAGNOSIS — G5793 Unspecified mononeuropathy of bilateral lower limbs: Secondary | ICD-10-CM

## 2022-05-21 DIAGNOSIS — I739 Peripheral vascular disease, unspecified: Secondary | ICD-10-CM

## 2022-05-21 MED ORDER — NAPROXEN 500 MG PO TABS: 500.0000 mg | ORAL_TABLET | Freq: Two times a day (BID) | ORAL | 0 refills | Status: DC | PRN

## 2022-05-21 MED ORDER — PANTOPRAZOLE SODIUM 40 MG PO TBEC: 40.0000 mg | DELAYED_RELEASE_TABLET | Freq: Every day | ORAL | 1 refills | Status: DC

## 2022-05-21 NOTE — Progress Notes (Signed)
BP 130/78   Pulse 74   Temp (!) 97.2 F (36.2 C)   Ht 6\' 6"  (1.981 m)   Wt 256 lb (116.1 kg)   SpO2 98%   BMI 29.58 kg/m    Subjective:    Patient ID: , male    DOB: May 19, 1971, 51 y.o.   MRN: [id number]  HPI: Timothy Nielsen is a 51 y.o. male presenting on 05/21/2022 for Follow-up   HPI  Pt is 51yoWM with HTN, dyslipidemia, PAD, COPD, tobacco use disorder,  pre-diabetes, cervical radiculopathy and chronic neuropathic pain both feet.    PAD who was seen by vascular surgeon.      Pt is s/p atherectomy and angioplasty of the LE.   He was scheduled for follow up LE arterial scan in November but pt rescheduled it to December.    Pt says he is Doing alright he guesses.   He ran out of his naproxen that he uses for his knee pain and protonix.  He didn't get his labs drawn; he says he didn't because he doesn't want another bill  He gets medicaid starting December 1.   He doesn't have his card with him but says [facility] was listed to be his new PCP.    He is still smoking.  He says The gabapentin is helping a lot with his pain.    Relevant past medical, surgical, family and social history reviewed and updated as indicated. Interim medical history since our last visit reviewed. Allergies and medications reviewed and updated.     Current Outpatient Medications:    albuterol (VENTOLIN HFA) 108 (90 Base) MCG/ACT inhaler, INHALE 2 PUFFS BY MOUTH EVERY 6 HOURS AS NEEDED FOR COUGHING, WHEEZING, OR SHORTNESS OF BREATH, Disp: 3 each, Rfl: 0   aspirin EC 81 MG tablet, Take 81 mg by mouth daily. Swallow whole., Disp: , Rfl:    atorvastatin (LIPITOR) 80 MG tablet, Take 1 tablet (80 mg total) by mouth daily., Disp: 90 tablet, Rfl: 0   clopidogrel (PLAVIX) 75 MG tablet, Take 1 tablet (75 mg total) by mouth daily., Disp: 90 tablet, Rfl: 0   Cyanocobalamin 1500 MCG TBDP, Take 3,000 mcg by mouth in the morning and at bedtime., Disp: , Rfl:    fluticasone  furoate-vilanterol (BREO ELLIPTA) 100-25 MCG/ACT AEPB, Inhale 1 puff into the lungs daily., Disp: 3 each, Rfl: 3   gabapentin (NEURONTIN) 300 MG capsule, Take 1 po qd x 1 week then increase to bid, Disp: 60 capsule, Rfl: 1   lisinopril-hydrochlorothiazide (ZESTORETIC) 20-12.5 MG tablet, TAKE 1 Tablet BY MOUTH ONCE EVERY DAY, Disp: 90 tablet, Rfl: 0   metoprolol tartrate (LOPRESSOR) 25 MG tablet, Take 1 tablet (25 mg total) by mouth 2 (two) times daily., Disp: 180 tablet, Rfl: 1   nitroGLYCERIN (NITROSTAT) 0.4 MG SL tablet, Place 1 tablet (0.4 mg total) under the tongue every 5 (five) minutes as needed for chest pain., Disp: 25 tablet, Rfl: 3   omeprazole (PRILOSEC) 20 MG capsule, Take 20 mg by mouth daily., Disp: , Rfl:    sertraline (ZOLOFT) 100 MG tablet, Take 100 mg by mouth daily., Disp: , Rfl:    traZODone (DESYREL) 50 MG tablet, Take 25-50 mg by mouth at bedtime as needed for sleep., Disp: , Rfl:    naproxen (NAPROSYN) 500 MG tablet, 1 po bid with meals for one to two weeks (Patient not taking: Reported on 05/21/2022), Disp: 28 tablet, Rfl: 0   pantoprazole (PROTONIX) 40 MG tablet, Take  1 tablet (40 mg total) by mouth daily. (Patient not taking: Reported on 05/21/2022), Disp: 30 tablet, Rfl: 1    Review of Systems  Per HPI unless specifically indicated above     Objective:    BP 130/78   Pulse 74   Temp (!) 97.2 F (36.2 C)   Ht 6\' 6"  (1.981 m)   Wt 256 lb (116.1 kg)   SpO2 98%   BMI 29.58 kg/m   Wt Readings from Last 3 Encounters:  05/21/22 256 lb (116.1 kg)  03/27/22 261 lb 12 oz (118.7 kg)  03/19/22 260 lb 12.8 oz (118.3 kg)    Physical Exam Constitutional:      General: He is not in acute distress.    Appearance: He is not toxic-appearing.  HENT:     Head: Normocephalic and atraumatic.  Pulmonary:     Effort: Pulmonary effort is normal. No respiratory distress.  Neurological:     Mental Status: He is oriented to person, place, and time.  Psychiatric:         Behavior: Behavior normal.           Assessment & Plan:    Encounter Diagnoses  Name Primary?   Primary hypertension Yes   Hyperlipidemia, unspecified hyperlipidemia type    PAD (peripheral artery disease) (HCC)    Neuropathy of both feet    Prediabetes    Tobacco use disorder    Chronic obstructive pulmonary disease, unspecified COPD type (HCC)      -he was given refills of naproxen and protonix.  He says he has plenty of his other meds -encouraged smoking cessation -pt is encouraged to Get appt with new PCP and update labs there since his medicaid starts 05/24/22

## 2022-05-24 ENCOUNTER — Other Ambulatory Visit: Payer: Medicaid Other | Admitting: Physician Assistant

## 2022-05-28 NOTE — Progress Notes (Unsigned)
Cardiology Office Note  Date: 05/29/2022   ID: Timothy Nielsen, DOB [birth year], MRN [id number]  PCP:  Billie Lade, MD  Cardiologist:  Nona Dell, MD Electrophysiologist:  None   Chief Complaint  Patient presents with   Cardiac follow-up    History of Present Illness: Timothy Nielsen is a 51 y.o. male last seen in June.  He is here for a follow-up visit.  Reports no angina and stable NYHA class II dyspnea.  Complains mainly of neuropathy in his feet, also right knee arthritic pain.  No progressive claudication. He has had interval evaluation by Dr. Allyson Sabal for management of PAD, I reviewed the notes and testing.  I reviewed his most recent lab work and current medications.  Lipitor was uptitrated to 80 mg daily, he has not had a follow-up lipid panel as yet with last LDL 93.  This is to be rechecked by PCP in the next few months.  He reports no intolerances.  Otherwise on stable antihypertensive regimen.  Past Medical History:  Diagnosis Date   Asthma    Hypertension    PAD (peripheral artery disease) (HCC)    Sydenham's chorea    Childhood - uses cane with mild residual balance disorder    Past Surgical History:  Procedure Laterality Date   ABDOMINAL AORTOGRAM W/LOWER EXTREMITY Bilateral 10/29/2021   Procedure: ABDOMINAL AORTOGRAM W/LOWER EXTREMITY;  Surgeon: Runell Gess, MD;  Location: [facility] INVASIVE CV LAB;  Service: Cardiovascular;  Laterality: Bilateral;   APPENDECTOMY     FRACTURE SURGERY Right    Right knee- age 31   PERIPHERAL VASCULAR ATHERECTOMY  10/29/2021   Procedure: PERIPHERAL VASCULAR ATHERECTOMY;  Surgeon: Runell Gess, MD;  Location: [facility] INVASIVE CV LAB;  Service: Cardiovascular;;  Rt. SFA    Current Outpatient Medications  Medication Sig Dispense Refill   albuterol (VENTOLIN HFA) 108 (90 Base) MCG/ACT inhaler INHALE 2 PUFFS BY MOUTH EVERY 6 HOURS AS NEEDED FOR COUGHING, WHEEZING, OR SHORTNESS OF BREATH 3 each 0   aspirin EC 81 MG tablet Take 81 mg by  mouth daily. Swallow whole.     atorvastatin (LIPITOR) 80 MG tablet Take 1 tablet (80 mg total) by mouth daily. 90 tablet 0   clopidogrel (PLAVIX) 75 MG tablet Take 1 tablet (75 mg total) by mouth daily. 90 tablet 0   Cyanocobalamin 1500 MCG TBDP Take 3,000 mcg by mouth in the morning and at bedtime.     fluticasone furoate-vilanterol (BREO ELLIPTA) 100-25 MCG/ACT AEPB Inhale 1 puff into the lungs daily. 3 each 3   gabapentin (NEURONTIN) 300 MG capsule TAKE 1 CAPSULE BY MOUTH 2 TIMES DAILY 60 capsule 1   lisinopril-hydrochlorothiazide (ZESTORETIC) 20-12.5 MG tablet TAKE 1 Tablet BY MOUTH ONCE EVERY DAY 90 tablet 0   metoprolol tartrate (LOPRESSOR) 25 MG tablet Take 1 tablet (25 mg total) by mouth 2 (two) times daily. 180 tablet 1   naproxen (NAPROSYN) 500 MG tablet Take 1 tablet (500 mg total) by mouth 2 (two) times daily as needed. 30 tablet 0   nitroGLYCERIN (NITROSTAT) 0.4 MG SL tablet Place 1 tablet (0.4 mg total) under the tongue every 5 (five) minutes as needed for chest pain. 25 tablet 3   pantoprazole (PROTONIX) 40 MG tablet Take 1 tablet (40 mg total) by mouth daily. 30 tablet 1   sertraline (ZOLOFT) 100 MG tablet Take 100 mg by mouth daily.     traZODone (DESYREL) 50 MG tablet Take 25-50 mg by mouth at bedtime as needed  for sleep.     No current facility-administered medications for this visit.   Allergies:  Patient has no known allergies.   ROS: No palpitations or syncope.  Physical Exam: VS:  BP 130/70   Pulse 72   Ht 6\' 6"  (1.981 m)   Wt 259 lb 3.2 oz (117.6 kg)   SpO2 96%   BMI 29.95 kg/m , BMI Body mass index is 29.95 kg/m.  Wt Readings from Last 3 Encounters:  05/29/22 259 lb 3.2 oz (117.6 kg)  05/21/22 256 lb (116.1 kg)  03/27/22 261 lb 12 oz (118.7 kg)    General: Patient appears comfortable at rest.  Using a cane. HEENT: Conjunctiva and lids normal Neck: Supple, no elevated JVP or carotid bruits. Lungs: Clear to auscultation, nonlabored breathing at  rest. Cardiac: Regular rate and rhythm, no S3 or significant systolic murmur. Extremities: No pitting edema, no ulcerations.  ECG:  An ECG dated 10/17/2021 was personally reviewed today and demonstrated:  Sinus rhythm with lead motion artifact.  Recent Labwork: 10/17/2021: Hemoglobin 16.7; Platelets 269 02/15/2022: ALT 53; AST 29; BUN 9; Creatinine, Ser 0.79; Potassium 4.1; Sodium 136     Component Value Date/Time   CHOL 162 02/15/2022 0838   CHOL 142 03/30/2019 1618   TRIG 166 (H) 02/15/2022 0838   HDL 36 (L) 02/15/2022 0838   HDL 55 03/30/2019 1618   CHOLHDL 4.5 02/15/2022 0838   VLDL 33 02/15/2022 0838   LDLCALC 93 02/15/2022 0838   LDLCALC 72 03/30/2019 1618    Other Studies Reviewed Today:  05/30/2019 Myoview 08/28/2021:   Findings are consistent with no prior ischemia. The study is intermediate risk.   No ST deviation was noted.   Left ventricular function is abnormal. Global function is mildly reduced. End diastolic cavity size is moderately enlarged. End systolic cavity size is mildly enlarged.   Prior study not available for comparison.   Findings: Occasional PVCs during pharmacological stress.  No evidence of ischemia or infarction. There is an apical perfusion defect that improves with stress consistent with artifact. There is basal, inferior septal wall motion abnormality with increase in LV size and LVEF of 49 % (mildly reduced) but in the setting of subdiaphragmatic attenuation worse at rest.   Conclusions: Stress test is negative for ischemia and less suggestive of infarction Moderate risk study due to mildly decrease LVEF. Consider echocardiogram if clinically indicated.   Echocardiogram 09/04/2021:  1. Left ventricular ejection fraction, by estimation, is 60 to 65%. The  left ventricle has normal function. The left ventricle has no regional  wall motion abnormalities. Left ventricular diastolic parameters are  indeterminate. The average left  ventricular global  longitudinal strain is -20.5 %. The global longitudinal  strain is normal.   2. Right ventricular systolic function is normal. The right ventricular  size is normal. Tricuspid regurgitation signal is inadequate for assessing  PA pressure.   3. The mitral valve is normal in structure. No evidence of mitral valve  regurgitation. No evidence of mitral stenosis.   4. The aortic valve is tricuspid. Aortic valve regurgitation is not  visualized. No aortic stenosis is present.   5. Aortic dilatation noted. 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   6. The inferior vena cava is normal in size with greater than 50%  respiratory variability, suggesting right atrial pressure of 3 mmHg.   Assessment and Plan:  1.  PAD status post directional atherectomy and drug-coated balloon angioplasty of the right  SFA in May by Dr. Allyson Sabal.  No progressive claudication reported.  He remains on medical therapy including aspirin, Plavix, and now high-dose Lipitor.  2.  Mixed hyperlipidemia, now on Lipitor 80 mg daily.  Last LDL was up to 93 although had been better controlled in the past.  Reports compliance with medical therapy, will have follow-up FLP with PCP in the next few months.  Ideally goal LDL should be in the 50s to 60s.  3.  Essential hypertension, blood pressure control is reasonable today on current regimen including Zestoretic and Lopressor.  Medication Adjustments/Labs and Tests Ordered: Current medicines are reviewed at length with the patient today.  Concerns regarding medicines are outlined above.   Tests Ordered: No orders of the defined types were placed in this encounter.   Medication Changes: No orders of the defined types were placed in this encounter.   Disposition:  Follow up  6 months.  Signed, Jonelle Sidle, MD, [facility] 05/29/2022 8:58 AM    [facility] [address] [place], [place], [place] [address] Phone: [phone]; Fax:  [phone]

## 2022-05-29 ENCOUNTER — Ambulatory Visit: Payer: Medicaid Other | Attending: Cardiology | Admitting: Cardiology

## 2022-05-29 ENCOUNTER — Encounter: Payer: Self-pay | Admitting: Cardiology

## 2022-05-29 VITALS — BP 130/70 | HR 72 | Ht 78.0 in | Wt 259.2 lb

## 2022-05-29 DIAGNOSIS — I739 Peripheral vascular disease, unspecified: Secondary | ICD-10-CM

## 2022-05-29 DIAGNOSIS — E782 Mixed hyperlipidemia: Secondary | ICD-10-CM

## 2022-05-29 DIAGNOSIS — I10 Essential (primary) hypertension: Secondary | ICD-10-CM

## 2022-05-29 NOTE — Patient Instructions (Addendum)

## 2022-06-04 ENCOUNTER — Ambulatory Visit (HOSPITAL_COMMUNITY)
Admission: RE | Admit: 2022-06-04 | Discharge: 2022-06-04 | Disposition: A | Discharge status: Home / Self Care | Payer: Medicaid Other | Source: Ambulatory Visit | Attending: Internal Medicine | Admitting: Internal Medicine

## 2022-06-04 DIAGNOSIS — I739 Peripheral vascular disease, unspecified: Secondary | ICD-10-CM | POA: Diagnosis not present

## 2022-06-05 ENCOUNTER — Other Ambulatory Visit: Payer: Self-pay

## 2022-06-05 DIAGNOSIS — I739 Peripheral vascular disease, unspecified: Secondary | ICD-10-CM

## 2022-06-12 ENCOUNTER — Telehealth: Payer: Self-pay | Admitting: Cardiovascular Disease

## 2022-06-12 NOTE — Telephone Encounter (Signed)
Bernita Buffy, RN 06/12/2022  8:49 AM EST     Letter with results mailed to pt.   Bernita Buffy, RN 06/05/2022  7:48 AM EST     [facility]art 12/13   Runell Gess, MD 06/04/2022  3:53 PM EST     Progression of mid right SFA stenosis status post intervention.  Repeat 6 months.   Runell Gess, MD 06/04/2022  1:09 PM EST     Early restenosis RSFA s/p atherectomy/DCB. Repeat 6 months     Patient called w/results

## 2022-06-12 NOTE — Telephone Encounter (Signed)
Patient was returning call. Please advise ?

## 2022-06-21 ENCOUNTER — Ambulatory Visit: Payer: Medicaid Other | Admitting: Internal Medicine

## 2022-06-26 ENCOUNTER — Ambulatory Visit (INDEPENDENT_AMBULATORY_CARE_PROVIDER_SITE_OTHER): Payer: Medicaid Other | Admitting: Internal Medicine

## 2022-06-26 ENCOUNTER — Telehealth: Payer: Self-pay | Admitting: Internal Medicine

## 2022-06-26 ENCOUNTER — Encounter: Payer: Self-pay | Admitting: Internal Medicine

## 2022-06-26 ENCOUNTER — Other Ambulatory Visit: Payer: Self-pay

## 2022-06-26 VITALS — BP 128/78 | HR 83 | Resp 18 | Ht 78.0 in | Wt 253.1 lb

## 2022-06-26 DIAGNOSIS — J441 Chronic obstructive pulmonary disease with (acute) exacerbation: Secondary | ICD-10-CM | POA: Diagnosis not present

## 2022-06-26 DIAGNOSIS — K219 Gastro-esophageal reflux disease without esophagitis: Secondary | ICD-10-CM

## 2022-06-26 DIAGNOSIS — Z0001 Encounter for general adult medical examination with abnormal findings: Secondary | ICD-10-CM | POA: Diagnosis not present

## 2022-06-26 DIAGNOSIS — Z1211 Encounter for screening for malignant neoplasm of colon: Secondary | ICD-10-CM | POA: Diagnosis not present

## 2022-06-26 DIAGNOSIS — F17200 Nicotine dependence, unspecified, uncomplicated: Secondary | ICD-10-CM

## 2022-06-26 DIAGNOSIS — I739 Peripheral vascular disease, unspecified: Secondary | ICD-10-CM

## 2022-06-26 DIAGNOSIS — E538 Deficiency of other specified B group vitamins: Secondary | ICD-10-CM | POA: Diagnosis not present

## 2022-06-26 DIAGNOSIS — E785 Hyperlipidemia, unspecified: Secondary | ICD-10-CM | POA: Insufficient documentation

## 2022-06-26 DIAGNOSIS — E7800 Pure hypercholesterolemia, unspecified: Secondary | ICD-10-CM

## 2022-06-26 MED ORDER — NICOTINE POLACRILEX 4 MG MT GUM: 4.0000 mg | CHEWING_GUM | OROMUCOSAL | 0 refills | Status: DC | PRN

## 2022-06-26 MED ORDER — CLOPIDOGREL BISULFATE 75 MG PO TABS: 75.0000 mg | ORAL_TABLET | Freq: Every day | ORAL | 3 refills | Status: DC

## 2022-06-26 MED ORDER — PREDNISONE 20 MG PO TABS: 40.0000 mg | ORAL_TABLET | Freq: Every day | ORAL | 0 refills | Status: DC

## 2022-06-26 MED ORDER — PANTOPRAZOLE SODIUM 40 MG PO TBEC: 40.0000 mg | DELAYED_RELEASE_TABLET | Freq: Every day | ORAL | 0 refills | Status: DC

## 2022-06-26 MED ORDER — VARENICLINE TARTRATE (STARTER) 0.5 MG X 11 & 1 MG X 42 PO TBPK: ORAL_TABLET | ORAL | 0 refills | Status: DC

## 2022-06-26 MED ORDER — ATORVASTATIN CALCIUM 80 MG PO TABS: 80.0000 mg | ORAL_TABLET | Freq: Every day | ORAL | 3 refills | Status: DC

## 2022-06-26 MED ORDER — PREDNISONE 20 MG PO TABS: 40.0000 mg | ORAL_TABLET | Freq: Every day | ORAL | 0 refills | Status: AC

## 2022-06-26 NOTE — Assessment & Plan Note (Signed)
History of suggested COPD. No PFT's on file. 37 pack years. He has increased SOB and increased sputum production this week. Currently on Breo elipta and albuterol as needed. Using albuterol twice daily.   Assessment/Plan: Chronic problem with acute exacerbation.  Prescribed prednisone for 5 days.  Continue Breo.Albuterol as needed.  Recommend PFT's in future.

## 2022-06-26 NOTE — Assessment & Plan Note (Signed)
Refilled atorvastatin and clopidogrel

## 2022-06-26 NOTE — Patient Instructions (Signed)
Thank you, [name] for allowing [place] to provide your care today. Today we discussed COPD, tobacco use, and general screenings needed.    I have ordered the following labs for you:   Lab Orders         Hemoglobin A1c         TSH         VITAMIN D 25 Hydroxy (Vit-D Deficiency, Fractures)         HCV Ab w Reflex to Quant PCR         HIV Antibody (routine testing w rflx)         Cologuard         B12 and Folate Panel         CBC with Differential/Platelet         Lipid Profile      Tests ordered today:  none  Referrals ordered today:   Referral Orders  No referral(s) requested today     I have ordered the following medication/changed the following medications:   Stop the following medications: There are no discontinued medications.   Start the following medications: Meds ordered this encounter  Medications   predniSONE (DELTASONE) 20 MG tablet    Sig: Take 2 tablets (40 mg total) by mouth daily for 5 days.    Dispense:  5 tablet    Refill:  0   Varenicline Tartrate, Starter, (CHANTIX STARTING MONTH PAK) 0.5 MG X 11 & 1 MG X 42 TBPK    Sig: Days 1 to 3: 0.5 mg once daily.  Days 4 to 7: 0.5 mg twice daily.  Then 1 mg twice daily    Dispense:  1 each    Refill:  0   nicotine polacrilex (NICORETTE) 4 MG gum    Sig: Take 1 each (4 mg total) by mouth as needed for smoking cessation.    Dispense:  100 tablet    Refill:  0     Follow up:  1 month       Remember: Come back one morning in the next week for fasting labs.     Tamsen Snider, M.D.

## 2022-06-26 NOTE — Assessment & Plan Note (Addendum)
History of B12 deficiency on oral supplementation. Patient drinks approximately 18 beers on weekend. Has history of peripheral neuropathy.   Assessment/Plan: Chronic problem. Check B12 levels today. Counseled on alcohol use.

## 2022-06-26 NOTE — Assessment & Plan Note (Signed)
52 year old. No family history of colon cancer. Last screening with [facility] Due for colon cancer screening. - Cologuard

## 2022-06-26 NOTE — Progress Notes (Signed)
HPI:Mr.Anna Livers is a 52 y.o. male living with GERD, tobacco use disorder, HTN, HLD, PAD, and COPD who presents to establish care. For the details of today's visit, please refer to the assessment and plan.   Past Medical History:  Diagnosis Date   Asthma    COPD (chronic obstructive pulmonary disease) ([place])    Hypertension    PAD (peripheral artery disease) (HCC)    Sydenham's chorea    Childhood - uses cane with mild residual balance disorder    Past Surgical History:  Procedure Laterality Date   ABDOMINAL AORTOGRAM W/LOWER EXTREMITY Bilateral 10/29/2021   Procedure: ABDOMINAL AORTOGRAM W/LOWER EXTREMITY;  Surgeon: Lorretta Harp, MD;  Location: [facility];  Service: Cardiovascular;  Laterality: Bilateral;   APPENDECTOMY     FRACTURE SURGERY Right    Right knee- age 52   PERIPHERAL VASCULAR ATHERECTOMY  10/29/2021   Procedure: PERIPHERAL VASCULAR ATHERECTOMY;  Surgeon: Lorretta Harp, MD;  Location: [facility];  Service: Cardiovascular;;  Rt. SFA    Family History  Problem Relation Age of Onset   Diabetes Mother    Hypertension Mother    Alzheimer's disease Mother    Diabetes Father     Social History   Tobacco Use   Smoking status: Every Day    Packs/day: 1.00    Years: 37.00    Total pack years: 37.00    Types: Cigarettes    Start date: 11/14/1986   Smokeless tobacco: Never  Vaping Use   Vaping Use: Never used  Substance Use Topics   Alcohol use: Yes    Alcohol/week: 18.0 standard drinks of alcohol    Types: 18 Cans of beer per week    Comment: 12 pack a week   Drug use: No    Physical Exam: Vitals:   06/26/22 0810  BP: 128/78  Pulse: 83  Resp: 18  SpO2: 95%  Weight: 253 lb 1.9 oz (114.8 kg)  Height: 6\' 6"  (1.981 m)     Physical Exam Constitutional:      Appearance: He is well-developed and overweight.  Cardiovascular:     Rate and Rhythm: Normal rate and regular rhythm.     Heart sounds: No murmur heard. Pulmonary:      Effort: Pulmonary effort is normal. No respiratory distress.     Breath sounds: No wheezing.     Comments: Bilateral decreased breath sounds at bases  Abdominal:     General: Bowel sounds are normal.     Palpations: Abdomen is soft.  Musculoskeletal:     Right lower leg: No edema.     Left lower leg: No edema.  Skin:    General: Skin is warm and dry.  Neurological:     Mental Status: He is alert.  Psychiatric:        Mood and Affect: Mood and affect normal.        Speech: Speech is tangential.        Behavior: Behavior normal.        Thought Content: Thought content normal.      Assessment & Plan:   COPD (chronic obstructive pulmonary disease) (HCC) History of suggested COPD. No PFT's on file. 37 pack years. He has increased SOB and increased sputum production this week. Currently on Breo elipta and albuterol as needed. Using albuterol twice daily.   Assessment/Plan: Chronic problem with acute exacerbation.  Prescribed prednisone for 5 days.  Continue Breo.Albuterol as needed.  Recommend PFT's  in future.   Tobacco use disorder Patient has 37-pack-year history.  He has quit once this year when finding out he had PA, but resumed at the 1 month.  He quit with the help of nicotine patches but then had a skin reaction.  He also notes he has anxiety and smoking helps him with anxiety. Recently started seeing psychiatrists who has prescribed sertraline.   Assessment/Plan: Chronic problem, untreated.   37 pack years and COPD suggested on evaluation. Patient counseled on tobacco use.Start Chantix today and nicotine gum. Goal is quit date with in one month.    GERD (gastroesophageal reflux disease) Refilled protonix  Peripheral arterial disease (HCC) Refilled atorvastatin and clopidogrel  HLD (hyperlipidemia) Patient has HLD and PAD. Atorvastatin increased to 80 mg approximately 3 months ago. Target LDL< 50.  -Check lipid panel   Screening for colon cancer 53 year old. No  family history of colon cancer. Last screening with [facility] Due for colon cancer screening. - Cologuard   B12 deficiency History of B12 deficiency on oral supplementation. Patient drinks approximately 18 beers on weekend. Has history of peripheral neuropathy.   Assessment/Plan: Check B12 levels today. Counseled on alcohol use.     Lorene Dy, MD

## 2022-06-26 NOTE — Assessment & Plan Note (Addendum)
Patient has HLD and PAD. Atorvastatin increased to 80 mg approximately 3 months ago. Target LDL< 50.   Assessment/Plan: Chronic problem.  -Check lipid panel

## 2022-06-26 NOTE — Telephone Encounter (Signed)
Thera Flake, [phone]   Called stating she has a question on the qty of the predniSONE (DELTASONE) 20 MG tablet. Can you please call her when available?     PT SEEN DR Court Joy TODAY

## 2022-06-26 NOTE — Telephone Encounter (Signed)
Changed the quantity to 10 tabs and resent.

## 2022-06-26 NOTE — Assessment & Plan Note (Addendum)
Patient has 37-pack-year history.  He has quit once this year when finding out he had PA, but resumed at the 1 month.  He quit with the help of nicotine patches but then had a skin reaction.  He also notes he has anxiety and smoking helps him with anxiety. Recently started seeing psychiatrists who has prescribed sertraline.   Assessment/Plan: Chronic problem, untreated.   37 pack years and COPD suggested on evaluation. Patient counseled on tobacco use.Start Chantix today and nicotine gum. Goal is quit date with in one month.

## 2022-06-26 NOTE — Assessment & Plan Note (Signed)
Refilled protonix

## 2022-07-02 ENCOUNTER — Other Ambulatory Visit (HOSPITAL_COMMUNITY)
Admission: RE | Admit: 2022-07-02 | Discharge: 2022-07-02 | Disposition: A | Discharge status: Home / Self Care | Payer: Medicaid Other | Source: Ambulatory Visit | Attending: Internal Medicine | Admitting: Internal Medicine

## 2022-07-02 DIAGNOSIS — E538 Deficiency of other specified B group vitamins: Secondary | ICD-10-CM | POA: Insufficient documentation

## 2022-07-02 DIAGNOSIS — J441 Chronic obstructive pulmonary disease with (acute) exacerbation: Secondary | ICD-10-CM | POA: Diagnosis not present

## 2022-07-02 DIAGNOSIS — Z0001 Encounter for general adult medical examination with abnormal findings: Secondary | ICD-10-CM | POA: Insufficient documentation

## 2022-07-02 DIAGNOSIS — F329 Major depressive disorder, single episode, unspecified: Secondary | ICD-10-CM | POA: Diagnosis not present

## 2022-07-02 DIAGNOSIS — F419 Anxiety disorder, unspecified: Secondary | ICD-10-CM | POA: Diagnosis not present

## 2022-07-02 LAB — LIPID PANEL
Cholesterol: 130 mg/dL (ref 0–200)
HDL: 39 mg/dL — ABNORMAL LOW (ref >40)
LDL Cholesterol: 69 mg/dL (ref 0–99)
Total CHOL/HDL Ratio: 3.3 RATIO
Triglycerides: 111 mg/dL (ref <150)
VLDL: 22 mg/dL (ref 0–40)

## 2022-07-02 LAB — HIV ANTIBODY (ROUTINE TESTING W REFLEX): HIV Screen 4th Generation wRfx: NONREACTIVE

## 2022-07-02 LAB — CBC WITH DIFFERENTIAL/PLATELET
Abs Immature Granulocytes: 0.04 10*3/uL (ref 0.00–0.07)
Basophils Absolute: 0.1 10*3/uL (ref 0.0–0.1)
Basophils Relative: 1 %
Eosinophils Absolute: 0.1 10*3/uL (ref 0.0–0.5)
Eosinophils Relative: 1 %
HCT: 48.6 % (ref 39.0–52.0)
Hemoglobin: 16.8 g/dL (ref 13.0–17.0)
Immature Granulocytes: 0 %
Lymphocytes Relative: 11 %
Lymphs Abs: 1.1 10*3/uL (ref 0.7–4.0)
MCH: 31.8 pg (ref 26.0–34.0)
MCHC: 34.6 g/dL (ref 30.0–36.0)
MCV: 91.9 fL (ref 80.0–100.0)
Monocytes Absolute: 0.5 10*3/uL (ref 0.1–1.0)
Monocytes Relative: 5 %
Neutro Abs: 8.5 10*3/uL — ABNORMAL HIGH (ref 1.7–7.7)
Neutrophils Relative %: 82 %
Platelets: 245 10*3/uL (ref 150–400)
RBC: 5.29 MIL/uL (ref 4.22–5.81)
RDW: 13.2 % (ref 11.5–15.5)
WBC: 10.3 10*3/uL (ref 4.0–10.5)
nRBC: 0 % (ref 0.0–0.2)

## 2022-07-02 LAB — TSH: TSH: 1.433 u[IU]/mL (ref 0.350–4.500)

## 2022-07-02 LAB — HEMOGLOBIN A1C
Hgb A1c MFr Bld: 5.9 % — ABNORMAL HIGH (ref 4.8–5.6)
Mean Plasma Glucose: 122.63 mg/dL

## 2022-07-02 LAB — FOLATE: Folate: 9.4 ng/mL (ref >5.9)

## 2022-07-02 LAB — VITAMIN B12: Vitamin B-12: 791 pg/mL (ref 180–914)

## 2022-07-02 LAB — VITAMIN D 25 HYDROXY (VIT D DEFICIENCY, FRACTURES): Vit D, 25-Hydroxy: 14.95 ng/mL — ABNORMAL LOW (ref 30–100)

## 2022-07-03 LAB — HCV AB W REFLEX TO QUANT PCR: HCV Ab: NONREACTIVE

## 2022-07-03 LAB — HCV INTERPRETATION

## 2022-07-05 ENCOUNTER — Other Ambulatory Visit: Payer: Self-pay | Admitting: Internal Medicine

## 2022-07-05 DIAGNOSIS — E559 Vitamin D deficiency, unspecified: Secondary | ICD-10-CM

## 2022-07-05 DIAGNOSIS — E7800 Pure hypercholesterolemia, unspecified: Secondary | ICD-10-CM

## 2022-07-05 MED ORDER — VITAMIN D3 25 MCG (1000 UT) PO CAPS: 1000.0000 [IU] | ORAL_CAPSULE | Freq: Every day | ORAL | 1 refills | Status: DC

## 2022-07-05 MED ORDER — EZETIMIBE 10 MG PO TABS: 10.0000 mg | ORAL_TABLET | Freq: Every day | ORAL | 3 refills | Status: DC

## 2022-07-10 DIAGNOSIS — H2512 Age-related nuclear cataract, left eye: Secondary | ICD-10-CM | POA: Diagnosis not present

## 2022-07-10 DIAGNOSIS — Z1211 Encounter for screening for malignant neoplasm of colon: Secondary | ICD-10-CM | POA: Diagnosis not present

## 2022-07-19 LAB — COLOGUARD: COLOGUARD: NEGATIVE

## 2022-07-26 DIAGNOSIS — H2512 Age-related nuclear cataract, left eye: Secondary | ICD-10-CM | POA: Diagnosis not present

## 2022-07-26 DIAGNOSIS — H269 Unspecified cataract: Secondary | ICD-10-CM | POA: Diagnosis not present

## 2022-07-26 HISTORY — PX: CATARACT EXTRACTION: SUR2

## 2022-07-29 ENCOUNTER — Encounter: Payer: Self-pay | Admitting: Internal Medicine

## 2022-07-29 ENCOUNTER — Ambulatory Visit: Payer: Medicaid Other | Admitting: Internal Medicine

## 2022-07-29 VITALS — BP 119/75 | HR 83 | Ht 78.0 in | Wt 258.2 lb

## 2022-07-29 DIAGNOSIS — I10 Essential (primary) hypertension: Secondary | ICD-10-CM

## 2022-07-29 DIAGNOSIS — J449 Chronic obstructive pulmonary disease, unspecified: Secondary | ICD-10-CM

## 2022-07-29 DIAGNOSIS — Z122 Encounter for screening for malignant neoplasm of respiratory organs: Secondary | ICD-10-CM

## 2022-07-29 DIAGNOSIS — E559 Vitamin D deficiency, unspecified: Secondary | ICD-10-CM

## 2022-07-29 DIAGNOSIS — K219 Gastro-esophageal reflux disease without esophagitis: Secondary | ICD-10-CM

## 2022-07-29 DIAGNOSIS — F17200 Nicotine dependence, unspecified, uncomplicated: Secondary | ICD-10-CM

## 2022-07-29 DIAGNOSIS — G629 Polyneuropathy, unspecified: Secondary | ICD-10-CM

## 2022-07-29 DIAGNOSIS — L84 Corns and callosities: Secondary | ICD-10-CM

## 2022-07-29 DIAGNOSIS — E7800 Pure hypercholesterolemia, unspecified: Secondary | ICD-10-CM

## 2022-07-29 MED ORDER — PANTOPRAZOLE SODIUM 40 MG PO TBEC: 40.0000 mg | DELAYED_RELEASE_TABLET | Freq: Every day | ORAL | 0 refills | Status: DC

## 2022-07-29 MED ORDER — LISINOPRIL-HYDROCHLOROTHIAZIDE 20-12.5 MG PO TABS: ORAL_TABLET | ORAL | 0 refills | Status: DC

## 2022-07-29 MED ORDER — GABAPENTIN 300 MG PO CAPS: ORAL_CAPSULE | ORAL | 1 refills | Status: DC

## 2022-07-29 NOTE — Patient Instructions (Signed)
It was a pleasure to see you today.  Thank you for giving [place] the opportunity to be involved in your care.  Below is a brief recap of your visit and next steps.  We will plan to see you again in 3 months.  Summary No medication changes today I have ordered lung cancer screening and pulmonary function testing today I have also placed a referral to podiatry for foot care in the setting of neuropathy We will plan for follow up in 3 months

## 2022-07-29 NOTE — Progress Notes (Unsigned)
Established Patient Office Visit  Subjective   Patient ID: Timothy Nielsen, male    DOB: [birth year]  Age: 52 y.o. MRN: [id number]  Chief Complaint  Patient presents with   Hyperlipidemia    Follow up   Mr. Timothy Nielsen returns to care today.  He was last seen at [facility] on 1/3 by Dr. Court Nielsen as a new patient presenting to establish care.  He was prescribed prednisone 5 mg daily for treatment of COPD exacerbation.  Baseline labs were ordered.  1 month follow-up was arranged.  There have been no acute interval events.  Mr. Timothy Nielsen reports feeling well today.  He endorses symptoms of GERD as he is currently out of of Protonix.  He is otherwise asymptomatic.  He reports that he quit smoking 1 week ago and would like to move forward with lung cancer screening.  He has no additional acute concerns to discuss.  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   Past Surgical History:  Procedure Laterality Date   ABDOMINAL AORTOGRAM W/LOWER EXTREMITY Bilateral 10/29/2021   Procedure: ABDOMINAL AORTOGRAM W/LOWER EXTREMITY;  Surgeon: Timothy Harp, MD;  Location: [facility];  Service: Cardiovascular;  Laterality: Bilateral;   APPENDECTOMY     CATARACT EXTRACTION Left 07/26/2022   FRACTURE SURGERY Right    Right knee- age 59   PERIPHERAL VASCULAR ATHERECTOMY  10/29/2021   Procedure: PERIPHERAL VASCULAR ATHERECTOMY;  Surgeon: Timothy Harp, MD;  Location: [facility];  Service: Cardiovascular;;  Rt. SFA   Social History   Tobacco Use   Smoking status: Former    Packs/day: 1.00    Years: 37.00    Total pack years: 37.00    Types: Cigarettes    Start date: 11/14/1986    Quit date: 07/22/2022    Years since quitting: 0.0   Smokeless tobacco: Never  Vaping Use   Vaping Use: Never used  Substance  Use Topics   Alcohol use: Yes    Alcohol/week: 18.0 standard drinks of alcohol    Types: 18 Cans of beer per week    Comment: 12 pack a week   Drug use: No   Family History  Problem Relation Age of Onset   Diabetes Mother    Hypertension Mother    Alzheimer's disease Mother    Diabetes Father    No Known Allergies  Review of Systems  Gastrointestinal:  Positive for heartburn.     Objective:     BP 119/75   Pulse 83   Ht 6\' 6"  (1.981 m)   Wt 258 lb 3.2 oz (117.1 kg)   SpO2 96%   BMI 29.84 kg/m  BP Readings from Last 3 Encounters:  07/29/22 119/75  06/26/22 128/78  05/29/22 130/70   Physical Exam Vitals reviewed.  Constitutional:      General: He is not in acute distress.    Appearance: Normal appearance. He is not ill-appearing.  HENT:     Head: Normocephalic and atraumatic.     Right Ear: External ear normal.     Left Ear: External ear normal.     Nose: Nose normal. No congestion or rhinorrhea.     Mouth/Throat:     Mouth: Mucous membranes are moist.     Pharynx: Oropharynx  is clear.  Eyes:     General: No scleral icterus.    Extraocular Movements: Extraocular movements intact.     Conjunctiva/sclera: Conjunctivae normal.     Pupils: Pupils are equal, round, and reactive to light.  Cardiovascular:     Rate and Rhythm: Normal rate and regular rhythm.     Pulses: Normal pulses.     Heart sounds: Normal heart sounds. No murmur heard. Pulmonary:     Effort: Pulmonary effort is normal.     Breath sounds: Normal breath sounds. No wheezing, rhonchi or rales.  Abdominal:     General: Abdomen is flat. Bowel sounds are normal. There is no distension.     Palpations: Abdomen is soft.     Tenderness: There is no abdominal tenderness.  Musculoskeletal:        General: No swelling or deformity. Normal range of motion.     Cervical back: Normal range of motion.     Comments: Callus present on first digit of right foot  Skin:    General: Skin is warm and dry.      Capillary Refill: Capillary refill takes less than 2 seconds.  Neurological:     General: No focal deficit present.     Mental Status: He is alert and oriented to person, place, and time.     Motor: No weakness.  Psychiatric:        Mood and Affect: Mood normal.        Behavior: Behavior normal.        Thought Content: Thought content normal.   Last CBC Lab Results  Component Value Date   WBC 10.3 07/02/2022   HGB 16.8 07/02/2022   HCT 48.6 07/02/2022   MCV 91.9 07/02/2022   MCH 31.8 07/02/2022   RDW 13.2 07/02/2022   PLT 245 43/15/4008   Last metabolic panel Lab Results  Component Value Date   GLUCOSE 125 (H) 02/15/2022   NA 136 02/15/2022   K 4.1 02/15/2022   CL 103 02/15/2022   CO2 27 02/15/2022   BUN 9 02/15/2022   CREATININE 0.79 02/15/2022   GFRNONAA >60 02/15/2022   CALCIUM 9.3 02/15/2022   PROT 7.2 02/15/2022   ALBUMIN 4.2 02/15/2022   LABGLOB 2.5 08/18/2020   AGRATIO 1.9 08/18/2020   BILITOT 1.0 02/15/2022   ALKPHOS 105 02/15/2022   AST 29 02/15/2022   ALT 53 (H) 02/15/2022   ANIONGAP 6 02/15/2022   Last lipids Lab Results  Component Value Date   CHOL 130 07/02/2022   HDL 39 (L) 07/02/2022   LDLCALC 69 07/02/2022   TRIG 111 07/02/2022   CHOLHDL 3.3 07/02/2022   Last hemoglobin A1c Lab Results  Component Value Date   HGBA1C 5.9 (H) 07/02/2022   Last thyroid functions Lab Results  Component Value Date   TSH 1.433 07/02/2022   Last vitamin D Lab Results  Component Value Date   VD25OH 14.95 (L) 07/02/2022   Last vitamin B12 and Folate Lab Results  Component Value Date   VITAMINB12 791 07/02/2022   FOLATE 9.4 07/02/2022   The 10-year ASCVD risk score (Arnett DK, et al., 2019) is: 3.5%    Assessment & Plan:   Problem List Items Addressed This Visit       COPD (chronic obstructive pulmonary disease) ([place])    Previously documented history of COPD.  No PFTs on file for review.  He has a 37-pack-year smoking history but quit smoking  cigarettes last week.  He is currently prescribed  Breo as well as albuterol for as needed use.  Recently treated for COPD exacerbation and has been able to reduce the frequency of albuterol inhaler use. -No medication changes today.  Continue current inhaler regimen -PFTs ordered today      GERD (gastroesophageal reflux disease)    Currently endorses symptoms of reflux and states that he is out of Protonix.  Refill provided today.      Callus of foot    Large, painful callus present on the first digit of the right foot.  He has requested a referral to podiatry today. -Podiatry referral placed      Tobacco use disorder    37-pack-year history.  Started on Chantix at his last appointment and reports that he quit smoking cigarettes 1 week ago. -He was congratulated on achieving cessation and encouraged to not resume smoking. -LDCT ordered today      HLD (hyperlipidemia)    Lipid panel updated last month.  Total cholesterol 130 and LDL 69.  He is currently prescribed atorvastatin 80 mg daily and Zetia 10 mg daily.  Zetia was added last month as his LDL remained above goal (< 50).  He has not experienced any adverse side effects since starting Zetia. -No changes today.  Repeat lipid panel at next follow-up appointment.      Vitamin D deficiency    Noted on labs from last month.  He is currently on daily vitamin D supplementation, 1000 units. -Repeat vitamin D level at next follow-up appointment      Return in about 3 months (around 10/27/2022).    Johnette Abraham, MD

## 2022-07-31 ENCOUNTER — Encounter: Payer: Self-pay | Admitting: Internal Medicine

## 2022-07-31 DIAGNOSIS — E559 Vitamin D deficiency, unspecified: Secondary | ICD-10-CM | POA: Insufficient documentation

## 2022-07-31 DIAGNOSIS — L84 Corns and callosities: Secondary | ICD-10-CM | POA: Insufficient documentation

## 2022-07-31 NOTE — Assessment & Plan Note (Signed)
Noted on labs from last month.  He is currently on daily vitamin D supplementation, 1000 units. -Repeat vitamin D level at next follow-up appointment

## 2022-07-31 NOTE — Assessment & Plan Note (Signed)
Previously documented history of COPD.  No PFTs on file for review.  He has a 37-pack-year smoking history but quit smoking cigarettes last week.  He is currently prescribed Breo as well as albuterol for as needed use.  Recently treated for COPD exacerbation and has been able to reduce the frequency of albuterol inhaler use. -No medication changes today.  Continue current inhaler regimen -PFTs ordered today

## 2022-07-31 NOTE — Assessment & Plan Note (Signed)
Large, painful callus present on the first digit of the right foot.  He has requested a referral to podiatry today. -Podiatry referral placed

## 2022-07-31 NOTE — Assessment & Plan Note (Signed)
Lipid panel updated last month.  Total cholesterol 130 and LDL 69.  He is currently prescribed atorvastatin 80 mg daily and Zetia 10 mg daily.  Zetia was added last month as his LDL remained above goal (< 50).  He has not experienced any adverse side effects since starting Zetia. -No changes today.  Repeat lipid panel at next follow-up appointment.

## 2022-07-31 NOTE — Assessment & Plan Note (Signed)
37-pack-year history.  Started on Chantix at his last appointment and reports that he quit smoking cigarettes 1 week ago. -He was congratulated on achieving cessation and encouraged to not resume smoking. -LDCT ordered today

## 2022-07-31 NOTE — Assessment & Plan Note (Signed)
Currently endorses symptoms of reflux and states that he is out of Protonix.  Refill provided today.

## 2022-08-06 ENCOUNTER — Ambulatory Visit (INDEPENDENT_AMBULATORY_CARE_PROVIDER_SITE_OTHER): Payer: Medicaid Other | Admitting: Podiatry

## 2022-08-06 DIAGNOSIS — I739 Peripheral vascular disease, unspecified: Secondary | ICD-10-CM | POA: Diagnosis not present

## 2022-08-06 DIAGNOSIS — M795 Residual foreign body in soft tissue: Secondary | ICD-10-CM

## 2022-08-06 DIAGNOSIS — M792 Neuralgia and neuritis, unspecified: Secondary | ICD-10-CM

## 2022-08-06 NOTE — Progress Notes (Signed)
Subjective:  Patient ID: Timothy Nielsen, male    DOB: 22-Mar-1971,  MRN: [id number]  Chief Complaint  Patient presents with   Peripheral Neuropathy    Pt stated that his feet are cold and numb he stated that he takes gabapentin twice a day     52 y.o. male presents with the above complaint.  Patient presents with bilateral neuropathy/neuropathic pain.  Patient is not a diabetic.  He states his feet feel cold and numb.  He does not recall where is coming from.  He has not seen MRIs prior to seeing me.  He has been taking gabapentin which helps some's.  He just wanted to discuss treatment options if there is any other modalities that could help.  He denies any other acute complaints.  He has a history of peripheral vascular disease as well.   Review of Systems: Negative except as noted in the HPI. Denies N/V/F/Ch.  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    Current Outpatient Medications:    albuterol (VENTOLIN HFA) 108 (90 Base) MCG/ACT inhaler, INHALE 2 PUFFS BY MOUTH EVERY 6 HOURS AS NEEDED FOR COUGHING, WHEEZING, OR SHORTNESS OF BREATH, Disp: 3 each, Rfl: 0   aspirin EC 81 MG tablet, Take 81 mg by mouth daily. Swallow whole., Disp: , Rfl:    atorvastatin (LIPITOR) 80 MG tablet, Take 1 tablet (80 mg total) by mouth daily., Disp: 90 tablet, Rfl: 3   Cholecalciferol (VITAMIN D3) 25 MCG (1000 UT) CAPS, Take 1 capsule (1,000 Units total) by mouth daily., Disp: 90 capsule, Rfl: 1   clopidogrel (PLAVIX) 75 MG tablet, Take 1 tablet (75 mg total) by mouth daily., Disp: 90 tablet, Rfl: 3   Cyanocobalamin 1500 MCG TBDP, Take 3,000 mcg by mouth in the morning and at bedtime., Disp: , Rfl:    ezetimibe (ZETIA) 10 MG tablet, Take 1 tablet (10 mg total) by mouth daily., Disp: 90 tablet, Rfl: 3    fluticasone furoate-vilanterol (BREO ELLIPTA) 100-25 MCG/ACT AEPB, Inhale 1 puff into the lungs daily., Disp: 3 each, Rfl: 3   gabapentin (NEURONTIN) 300 MG capsule, TAKE 1 CAPSULE BY MOUTH 2 TIMES DAILY, Disp: 60 capsule, Rfl: 1   lisinopril-hydrochlorothiazide (ZESTORETIC) 20-12.5 MG tablet, TAKE 1 Tablet BY MOUTH ONCE EVERY DAY, Disp: 90 tablet, Rfl: 0   metoprolol tartrate (LOPRESSOR) 25 MG tablet, Take 1 tablet (25 mg total) by mouth 2 (two) times daily., Disp: 180 tablet, Rfl: 1   naproxen (NAPROSYN) 500 MG tablet, Take 1 tablet (500 mg total) by mouth 2 (two) times daily as needed., Disp: 30 tablet, Rfl: 0   nicotine polacrilex (NICORETTE) 4 MG gum, Take 1 each (4 mg total) by mouth as needed for smoking cessation., Disp: 100 tablet, Rfl: 0   nitroGLYCERIN (NITROSTAT) 0.4 MG SL tablet, Place 1 tablet (0.4 mg total) under the tongue every 5 (five) minutes as needed for chest pain., Disp: 25 tablet, Rfl: 3   pantoprazole (PROTONIX) 40 MG tablet, Take 1 tablet (40 mg total) by mouth daily., Disp: 90 tablet, Rfl: 0   sertraline (ZOLOFT) 100 MG tablet, Take 100 mg by mouth daily., Disp: , Rfl:    traZODone (DESYREL) 50 MG tablet, Take 25-50 mg by mouth at bedtime as needed for sleep., Disp: ,  Rfl:    Varenicline Tartrate, Starter, (CHANTIX STARTING MONTH PAK) 0.5 MG X 11 & 1 MG X 42 TBPK, Days 1 to 3: 0.5 mg once daily. Days 4 to 7: 0.5 mg twice daily. Then 1 mg twice daily, Disp: 1 each, Rfl: 0  Social History   Tobacco Use  Smoking Status Former   Packs/day: 1.00   Years: 37.00   Total pack years: 37.00   Types: Cigarettes   Start date: 11/14/1986   Quit date: 07/22/2022   Years since quitting: 0.0  Smokeless Tobacco Never    No Known Allergies Objective:  There were no vitals filed for this visit. There is no height or weight on file to calculate BMI. Constitutional Well developed. Well nourished.  Vascular Dorsalis pedis pulses faintly palpable bilaterally. Posterior tibial  pulses faintly palpable bilaterally. Capillary refill normal to all digits.  No cyanosis or clubbing noted. Pedal hair growth normal.  Neurologic Normal speech. Oriented to person, place, and time. Epicritic sensation to light touch grossly present bilaterally.  Dermatologic Nails well groomed and normal in appearance. No open wounds. No skin lesions.  Orthopedic: Manual muscle strength 5 out of 5.  No gross dislocation or deformity noted.  Mild hammertoe contractures noted.   Radiographs: None Assessment:   1. Neuropathic pain   2. Peripheral arterial disease ([place])    Plan:  Patient was evaluated and treated and all questions answered.  Bilateral neuropathy likely due to underlying PVD -All questions and concerns were discussed with the patient in extensive detail.  At this time gabapentin is helping I discussed shoe gear modification in extensive detail he states understanding.  Will continue clinical monitoring if it worsens I discussed with him to come back and see me he states understanding.  He is not a diabetic  No follow-ups on file.   Bilateral neuropathy from likely history of peripheral vascular disease.  Not diabetic.  Shoe gear modification.  Gabapentin is helping

## 2022-08-13 DIAGNOSIS — F329 Major depressive disorder, single episode, unspecified: Secondary | ICD-10-CM | POA: Diagnosis not present

## 2022-08-13 DIAGNOSIS — F419 Anxiety disorder, unspecified: Secondary | ICD-10-CM | POA: Diagnosis not present

## 2022-08-23 ENCOUNTER — Ambulatory Visit (HOSPITAL_COMMUNITY)
Admission: RE | Admit: 2022-08-23 | Discharge: 2022-08-23 | Disposition: A | Discharge status: Home / Self Care | Payer: Medicaid Other | Source: Ambulatory Visit | Attending: Internal Medicine | Admitting: Internal Medicine

## 2022-08-23 DIAGNOSIS — Z122 Encounter for screening for malignant neoplasm of respiratory organs: Secondary | ICD-10-CM | POA: Diagnosis present

## 2022-08-23 DIAGNOSIS — Z87891 Personal history of nicotine dependence: Secondary | ICD-10-CM | POA: Diagnosis not present

## 2022-08-23 DIAGNOSIS — H5213 Myopia, bilateral: Secondary | ICD-10-CM | POA: Diagnosis not present

## 2022-09-17 ENCOUNTER — Ambulatory Visit (HOSPITAL_COMMUNITY)
Admission: RE | Admit: 2022-09-17 | Discharge: 2022-09-17 | Disposition: A | Discharge status: Home / Self Care | Payer: Medicaid Other | Source: Ambulatory Visit | Attending: Internal Medicine | Admitting: Internal Medicine

## 2022-09-17 DIAGNOSIS — J449 Chronic obstructive pulmonary disease, unspecified: Secondary | ICD-10-CM | POA: Diagnosis present

## 2022-09-17 LAB — PULMONARY FUNCTION TEST
DL/VA % pred: 100 %
DL/VA: 4.25 ml/min/mmHg/L
DLCO unc % pred: 85 %
DLCO unc: 31.28 ml/min/mmHg
FEF 25-75 Post: 3.21 L/sec
FEF 25-75 Pre: 2.31 L/sec
FEF2575-%Change-Post: 39 %
FEF2575-%Pred-Post: 78 %
FEF2575-%Pred-Pre: 56 %
FEV1-%Change-Post: 12 %
FEV1-%Pred-Post: 76 %
FEV1-%Pred-Pre: 67 %
FEV1-Post: 3.74 L
FEV1-Pre: 3.33 L
FEV1FVC-%Change-Post: 0 %
FEV1FVC-%Pred-Pre: 87 %
FEV6-%Change-Post: 11 %
FEV6-%Pred-Post: 86 %
FEV6-%Pred-Pre: 77 %
FEV6-Post: 5.33 L
FEV6-Pre: 4.78 L
FEV6FVC-%Change-Post: -1 %
FEV6FVC-%Pred-Post: 99 %
FEV6FVC-%Pred-Pre: 101 %
FVC-%Change-Post: 11 %
FVC-%Pred-Post: 86 %
FVC-%Pred-Pre: 77 %
FVC-Post: 5.52 L
FVC-Pre: 4.94 L
Post FEV1/FVC ratio: 68 %
Post FEV6/FVC ratio: 96 %
Pre FEV1/FVC ratio: 67 %
Pre FEV6/FVC Ratio: 98 %
RV % pred: 200 %
RV: 5.09 L
TLC % pred: 117 %
TLC: 10.05 L

## 2022-09-17 MED ORDER — ALBUTEROL SULFATE (2.5 MG/3ML) 0.083% IN NEBU
2.5000 mg | INHALATION_SOLUTION | Freq: Once | RESPIRATORY_TRACT | Status: AC
  Administered 2022-09-17: 2.5 mg via RESPIRATORY_TRACT

## 2022-09-24 DIAGNOSIS — F329 Major depressive disorder, single episode, unspecified: Secondary | ICD-10-CM | POA: Diagnosis not present

## 2022-09-24 DIAGNOSIS — F419 Anxiety disorder, unspecified: Secondary | ICD-10-CM | POA: Diagnosis not present

## 2022-10-28 ENCOUNTER — Ambulatory Visit: Payer: Medicaid Other | Admitting: Internal Medicine

## 2022-10-29 ENCOUNTER — Encounter: Payer: Self-pay | Admitting: Internal Medicine

## 2022-10-29 ENCOUNTER — Ambulatory Visit (INDEPENDENT_AMBULATORY_CARE_PROVIDER_SITE_OTHER): Payer: Medicaid Other | Admitting: Internal Medicine

## 2022-10-29 VITALS — BP 94/63 | HR 92 | Ht 78.0 in | Wt 256.8 lb

## 2022-10-29 DIAGNOSIS — K219 Gastro-esophageal reflux disease without esophagitis: Secondary | ICD-10-CM

## 2022-10-29 DIAGNOSIS — J4540 Moderate persistent asthma, uncomplicated: Secondary | ICD-10-CM | POA: Diagnosis not present

## 2022-10-29 DIAGNOSIS — R1011 Right upper quadrant pain: Secondary | ICD-10-CM | POA: Diagnosis not present

## 2022-10-29 DIAGNOSIS — E559 Vitamin D deficiency, unspecified: Secondary | ICD-10-CM

## 2022-10-29 DIAGNOSIS — J45909 Unspecified asthma, uncomplicated: Secondary | ICD-10-CM

## 2022-10-29 DIAGNOSIS — F17200 Nicotine dependence, unspecified, uncomplicated: Secondary | ICD-10-CM

## 2022-10-29 DIAGNOSIS — I739 Peripheral vascular disease, unspecified: Secondary | ICD-10-CM

## 2022-10-29 DIAGNOSIS — E7800 Pure hypercholesterolemia, unspecified: Secondary | ICD-10-CM

## 2022-10-29 DIAGNOSIS — I10 Essential (primary) hypertension: Secondary | ICD-10-CM

## 2022-10-29 MED ORDER — PANTOPRAZOLE SODIUM 40 MG PO TBEC: 40.0000 mg | DELAYED_RELEASE_TABLET | Freq: Every day | ORAL | 0 refills | Status: DC

## 2022-10-29 MED ORDER — NITROGLYCERIN 0.4 MG SL SUBL: 0.4000 mg | SUBLINGUAL_TABLET | SUBLINGUAL | 3 refills | Status: AC | PRN

## 2022-10-29 MED ORDER — VARENICLINE TARTRATE (STARTER) 0.5 MG X 11 & 1 MG X 42 PO TBPK: ORAL_TABLET | ORAL | 0 refills | Status: DC

## 2022-10-29 MED ORDER — LISINOPRIL-HYDROCHLOROTHIAZIDE 20-12.5 MG PO TABS: ORAL_TABLET | ORAL | 0 refills | Status: DC

## 2022-10-29 MED ORDER — BUDESONIDE-FORMOTEROL FUMARATE 160-4.5 MCG/ACT IN AERO: 2.0000 | INHALATION_SPRAY | Freq: Two times a day (BID) | RESPIRATORY_TRACT | 3 refills | Status: DC

## 2022-10-29 MED ORDER — ATORVASTATIN CALCIUM 80 MG PO TABS: 80.0000 mg | ORAL_TABLET | Freq: Every day | ORAL | 3 refills | Status: DC

## 2022-10-29 NOTE — Assessment & Plan Note (Signed)
Lipid panel updated in January.  Total cholesterol 130 and LDL 69.  Zetia 10 mg daily was added in light of this result as his LDL remains above goal ( < 55).  He is additionally prescribed atorvastatin 80 mg daily. -Repeat lipid panel ordered today

## 2022-10-29 NOTE — Assessment & Plan Note (Signed)
Previously quit smoking with assistance of Chantix, but reports today that he has resumed smoking 1 pack/day.  He remains interested in quitting and would like to restart Chantix. -Chantix starter pack prescribed again today

## 2022-10-29 NOTE — Patient Instructions (Signed)
It was a pleasure to see you today.  Thank you for giving [place] the opportunity to be involved in your care.  Below is a brief recap of your visit and next steps.  We will plan to see you again in 3 months.  Summary Add Symbicort for daily use Restart Chantix RUQ ultrasound ordered Repeat cholesterol and vitamin D level when able Follow up in 3 months

## 2022-10-29 NOTE — Assessment & Plan Note (Signed)
He endorses a recent history of cramping pain in the right upper quadrant of his abdomen.  Abdominal exam is unremarkable today.  He is not able to identify any exacerbating or alleviating factors. -RUQ U/S ordered for further evaluation

## 2022-10-29 NOTE — Assessment & Plan Note (Signed)
Noted on previous labs.  He is currently prescribed daily vitamin D supplementation, 1000 units. -Repeat vitamin D level ordered today

## 2022-10-29 NOTE — Assessment & Plan Note (Signed)
PFTs completed since his last appointment and demonstrated moderate obstructive airway disease with significant bronchodilator response, consistent with asthma.  He is currently using an albuterol inhaler as needed for symptom relief, and endorses daily use.  Previously prescribed Breo, but stopped using this once COPD was excluded.  Diffuse bilateral wheezes are present on exam today.  He is interested in maintenance inhaler therapy. -Start Symbicort maintenance inhaler therapy -Continue albuterol as needed

## 2022-10-29 NOTE — Progress Notes (Signed)
Established Patient Office Visit  Subjective   Patient ID: Timothy Nielsen, male    DOB: 12/22/1970  Age: 52 y.o. MRN: [id number]  Chief Complaint  Patient presents with   COPD    Follow up   Timothy Nielsen returns to care today for follow-up.  Last evaluated by me on 2/5.  PFTs were ordered at that time and he was also referred to podiatry and lung cancer screening.  In the interim he has been evaluated by podiatry as well as seen by psychiatry for follow-up.  There have otherwise been no acute interval events.  Today Timothy Nielsen states that he has started smoking again.  He attributes this to significant stress.  He remains interested in smoking cessation and would like to try Chantix again as this was effective for him previously.  He additionally endorses vague, cramping pain in the right upper quadrant of his abdomen.  He is unable to identify any exacerbating or alleviating symptoms.  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   Past Surgical History:  Procedure Laterality Date   ABDOMINAL AORTOGRAM W/LOWER EXTREMITY Bilateral 10/29/2021   Procedure: ABDOMINAL AORTOGRAM W/LOWER EXTREMITY;  Surgeon: Runell Gess, MD;  Location: [facility] INVASIVE CV LAB;  Service: Cardiovascular;  Laterality: Bilateral;   APPENDECTOMY     CATARACT EXTRACTION Left 07/26/2022   FRACTURE SURGERY Right    Right knee- age 80   PERIPHERAL VASCULAR ATHERECTOMY  10/29/2021   Procedure: PERIPHERAL VASCULAR ATHERECTOMY;  Surgeon: Runell Gess, MD;  Location: [facility] INVASIVE CV LAB;  Service: Cardiovascular;;  Rt. SFA   Social History   Tobacco Use   Smoking status: Former    Packs/day: 1.00    Years: 37.00    Additional pack years: 0.00    Total pack years: 37.00    Types: Cigarettes    Start date: 11/14/1986     Quit date: 07/22/2022    Years since quitting: 0.2   Smokeless tobacco: Never  Vaping Use   Vaping Use: Never used  Substance Use Topics   Alcohol use: Yes    Alcohol/week: 18.0 standard drinks of alcohol    Types: 18 Cans of beer per week    Comment: 12 pack a week   Drug use: No   Family History  Problem Relation Age of Onset   Diabetes Mother    Hypertension Mother    Alzheimer's disease Mother    Diabetes Father    No Known Allergies  Review of Systems  Constitutional:  Negative for chills and fever.  HENT:  Negative for sore throat.   Respiratory:  Negative for cough and shortness of breath.   Cardiovascular:  Negative for chest pain, palpitations and leg swelling.  Gastrointestinal:  Positive for abdominal pain (RUQ). Negative for blood in stool, constipation, diarrhea, nausea and vomiting.  Genitourinary:  Negative for dysuria and hematuria.  Musculoskeletal:  Negative for myalgias.  Skin:  Negative for itching and rash.  Neurological:  Negative for dizziness and headaches.  Psychiatric/Behavioral:  Negative for depression and suicidal ideas.       Objective:     BP 94/63   Pulse 92   Ht 6\' 6"  (1.981 m)   Wt 256 lb 12.8 oz (116.5 kg)   SpO2 94%  BMI 29.68 kg/m  BP Readings from Last 3 Encounters:  10/29/22 94/63  07/29/22 119/75  06/26/22 128/78   Physical Exam Vitals reviewed.  Constitutional:      General: He is not in acute distress.    Appearance: Normal appearance. He is not ill-appearing.  HENT:     Head: Normocephalic and atraumatic.     Right Ear: External ear normal.     Left Ear: External ear normal.     Nose: Nose normal. No congestion or rhinorrhea.     Mouth/Throat:     Mouth: Mucous membranes are moist.     Pharynx: Oropharynx is clear.  Eyes:     General: No scleral icterus.    Extraocular Movements: Extraocular movements intact.     Conjunctiva/sclera: Conjunctivae normal.     Pupils: Pupils are equal, round, and reactive to  light.  Cardiovascular:     Rate and Rhythm: Normal rate and regular rhythm.     Pulses: Normal pulses.     Heart sounds: Normal heart sounds. No murmur heard. Pulmonary:     Effort: Pulmonary effort is normal.     Breath sounds: Wheezing (Diffuse bilateral wheezes) present. No rhonchi or rales.  Abdominal:     General: Abdomen is flat. Bowel sounds are normal. There is no distension.     Palpations: Abdomen is soft.     Tenderness: There is no abdominal tenderness.  Musculoskeletal:        General: No swelling or deformity. Normal range of motion.     Cervical back: Normal range of motion.  Skin:    General: Skin is warm and dry.     Capillary Refill: Capillary refill takes less than 2 seconds.  Neurological:     General: No focal deficit present.     Mental Status: He is alert and oriented to person, place, and time.     Motor: No weakness.  Psychiatric:        Mood and Affect: Mood normal.        Behavior: Behavior normal.        Thought Content: Thought content normal.   Last CBC Lab Results  Component Value Date   WBC 10.3 07/02/2022   HGB 16.8 07/02/2022   HCT 48.6 07/02/2022   MCV 91.9 07/02/2022   MCH 31.8 07/02/2022   RDW 13.2 07/02/2022   PLT 245 07/02/2022   Last metabolic panel Lab Results  Component Value Date   GLUCOSE 125 (H) 02/15/2022   NA 136 02/15/2022   K 4.1 02/15/2022   CL 103 02/15/2022   CO2 27 02/15/2022   BUN 9 02/15/2022   CREATININE 0.79 02/15/2022   GFRNONAA >60 02/15/2022   CALCIUM 9.3 02/15/2022   PROT 7.2 02/15/2022   ALBUMIN 4.2 02/15/2022   LABGLOB 2.5 08/18/2020   AGRATIO 1.9 08/18/2020   BILITOT 1.0 02/15/2022   ALKPHOS 105 02/15/2022   AST 29 02/15/2022   ALT 53 (H) 02/15/2022   ANIONGAP 6 02/15/2022   Last lipids Lab Results  Component Value Date   CHOL 130 07/02/2022   HDL 39 (L) 07/02/2022   LDLCALC 69 07/02/2022   TRIG 111 07/02/2022   CHOLHDL 3.3 07/02/2022   Last hemoglobin A1c Lab Results  Component  Value Date   HGBA1C 5.9 (H) 07/02/2022   Last thyroid functions Lab Results  Component Value Date   TSH 1.433 07/02/2022   Last vitamin D Lab Results  Component Value Date   VD25OH 14.95 (L) 07/02/2022  Last vitamin B12 and Folate Lab Results  Component Value Date   VITAMINB12 791 07/02/2022   FOLATE 9.4 07/02/2022   The 10-year ASCVD risk score (Arnett DK, et al., 2019) is: 4.7%    Assessment & Plan:   Problem List Items Addressed This Visit       Asthma - Primary    PFTs completed since his last appointment and demonstrated moderate obstructive airway disease with significant bronchodilator response, consistent with asthma.  He is currently using an albuterol inhaler as needed for symptom relief, and endorses daily use.  Previously prescribed Breo, but stopped using this once COPD was excluded.  Diffuse bilateral wheezes are present on exam today.  He is interested in maintenance inhaler therapy. -Start Symbicort maintenance inhaler therapy -Continue albuterol as needed      Tobacco use disorder    Previously quit smoking with assistance of Chantix, but reports today that he has resumed smoking 1 pack/day.  He remains interested in quitting and would like to restart Chantix. -Chantix starter pack prescribed again today      HLD (hyperlipidemia)    Lipid panel updated in January.  Total cholesterol 130 and LDL 69.  Zetia 10 mg daily was added in light of this result as his LDL remains above goal ( < 55).  He is additionally prescribed atorvastatin 80 mg daily. -Repeat lipid panel ordered today      Vitamin D deficiency    Noted on previous labs.  He is currently prescribed daily vitamin D supplementation, 1000 units. -Repeat vitamin D level ordered today      Right upper quadrant abdominal pain    He endorses a recent history of cramping pain in the right upper quadrant of his abdomen.  Abdominal exam is unremarkable today.  He is not able to identify any  exacerbating or alleviating factors. -RUQ U/S ordered for further evaluation      Return in about 3 months (around 01/29/2023).   Billie Lade, MD

## 2022-11-05 DIAGNOSIS — F419 Anxiety disorder, unspecified: Secondary | ICD-10-CM | POA: Diagnosis not present

## 2022-11-05 DIAGNOSIS — F329 Major depressive disorder, single episode, unspecified: Secondary | ICD-10-CM | POA: Diagnosis not present

## 2022-11-13 ENCOUNTER — Ambulatory Visit (HOSPITAL_COMMUNITY)
Admission: RE | Admit: 2022-11-13 | Discharge: 2022-11-13 | Disposition: A | Discharge status: Home / Self Care | Payer: Medicaid Other | Source: Ambulatory Visit | Attending: Internal Medicine | Admitting: Internal Medicine

## 2022-11-13 DIAGNOSIS — R1011 Right upper quadrant pain: Secondary | ICD-10-CM | POA: Diagnosis present

## 2022-11-14 LAB — LIPID PANEL
Chol/HDL Ratio: 3 ratio (ref 0.0–5.0)
Cholesterol, Total: 123 mg/dL (ref 100–199)
HDL: 41 mg/dL (ref >39)
LDL Chol Calc (NIH): 60 mg/dL (ref 0–99)
Triglycerides: 123 mg/dL (ref 0–149)
VLDL Cholesterol Cal: 22 mg/dL (ref 5–40)

## 2022-11-14 LAB — VITAMIN D 25 HYDROXY (VIT D DEFICIENCY, FRACTURES): Vit D, 25-Hydroxy: 45.5 ng/mL (ref 30.0–100.0)

## 2022-12-11 ENCOUNTER — Telehealth: Payer: Self-pay | Admitting: Internal Medicine

## 2022-12-11 NOTE — Telephone Encounter (Signed)
Patient calling stating that he has insurance now and needs a refill script sent to [facility] in [place] pharmacy for metoprolol tartrate 25 mg twice daily; patient states only has 2 tablets left

## 2022-12-17 ENCOUNTER — Emergency Department (HOSPITAL_COMMUNITY)
Admission: EM | Admit: 2022-12-17 | Discharge: 2022-12-18 | Disposition: A | Discharge status: Home / Self Care | Payer: Medicaid Other | Attending: Emergency Medicine | Admitting: Emergency Medicine

## 2022-12-17 ENCOUNTER — Other Ambulatory Visit: Payer: Self-pay

## 2022-12-17 ENCOUNTER — Encounter (HOSPITAL_COMMUNITY): Payer: Self-pay | Admitting: Emergency Medicine

## 2022-12-17 ENCOUNTER — Emergency Department (HOSPITAL_COMMUNITY): Payer: Medicaid Other

## 2022-12-17 DIAGNOSIS — Z7982 Long term (current) use of aspirin: Secondary | ICD-10-CM | POA: Insufficient documentation

## 2022-12-17 DIAGNOSIS — S99911A Unspecified injury of right ankle, initial encounter: Secondary | ICD-10-CM | POA: Diagnosis present

## 2022-12-17 DIAGNOSIS — X501XXA Overexertion from prolonged static or awkward postures, initial encounter: Secondary | ICD-10-CM | POA: Insufficient documentation

## 2022-12-17 DIAGNOSIS — S93401A Sprain of unspecified ligament of right ankle, initial encounter: Secondary | ICD-10-CM | POA: Diagnosis not present

## 2022-12-17 MED ORDER — HYDROCODONE-ACETAMINOPHEN 5-325 MG PO TABS
1.0000 | ORAL_TABLET | Freq: Once | ORAL | Status: AC
  Administered 2022-12-18: 1 via ORAL
  Filled 2022-12-17: qty 1

## 2022-12-17 NOTE — ED Triage Notes (Signed)
Pt arrives POV c/o right ankle pain after mechanical fall this afternoon. Pt states the pain has increased since it happened.

## 2022-12-18 MED ORDER — HYDROCODONE-ACETAMINOPHEN 5-325 MG PO TABS: 1.0000 | ORAL_TABLET | Freq: Four times a day (QID) | ORAL | 0 refills | Status: DC | PRN

## 2022-12-18 NOTE — ED Provider Notes (Signed)
[place] EMERGENCY DEPARTMENT AT [facility]  Provider Note  CSN: [id number] Arrival date & time: 12/17/22 2250  History Chief Complaint  Patient presents with   Ankle Pain    Timothy Nielsen is a 52 y.o. male with history of arthritis reports his L knee gave out while he was walking earlier tonight and he took his weight onto his R ankle which rolled over and he felt a pop. Complaining of pain to the anterior R ankle. Worse with movement and unable to bear weight. 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 No other reported injuries.    Home Medications Prior to Admission medications   Medication Sig Start Date End Date Taking? Authorizing Provider  HYDROcodone-acetaminophen (NORCO/VICODIN) 5-325 MG tablet Take 1 tablet by mouth every 6 (six) hours as needed for severe pain. 12/18/22  Yes Pollyann Savoy, MD  albuterol (VENTOLIN HFA) 108 (90 Base) MCG/ACT inhaler INHALE 2 PUFFS BY MOUTH EVERY 6 HOURS AS NEEDED FOR COUGHING, WHEEZING, OR SHORTNESS OF BREATH 02/18/22   Jacquelin Hawking, PA-C  aspirin EC 81 MG tablet Take 81 mg by mouth daily. Swallow whole.    [provider]  atorvastatin (LIPITOR) 80 MG tablet Take 1 tablet (80 mg total) by mouth daily. 10/29/22   Billie Lade, MD  budesonide-formoterol [facility]) 160-4.5 MCG/ACT inhaler Inhale 2 puffs into the lungs 2 (two) times daily. 10/29/22   Billie Lade, MD  Cholecalciferol (VITAMIN D3) 25 MCG (1000 UT) CAPS Take 1 capsule (1,000 Units total) by mouth daily. 07/05/22   Gardenia Phlegm, MD  clopidogrel (PLAVIX) 75 MG tablet Take 1 tablet (75 mg total) by mouth daily. 06/26/22   Gardenia Phlegm, MD  Cyanocobalamin 1500 MCG TBDP Take 3,000 mcg by mouth in the morning and at bedtime.    [provider]  ezetimibe (ZETIA) 10 MG tablet Take 1 tablet (10 mg total) by mouth daily. 07/05/22   Gardenia Phlegm, MD  gabapentin (NEURONTIN) 300 MG capsule TAKE 1 CAPSULE BY MOUTH 2 TIMES DAILY 07/29/22    Billie Lade, MD  lisinopril-hydrochlorothiazide (ZESTORETIC) 20-12.5 MG tablet TAKE 1 Tablet BY MOUTH ONCE EVERY DAY 10/29/22   Billie Lade, MD  metoprolol tartrate (LOPRESSOR) 25 MG tablet Take 1 tablet (25 mg total) by mouth 2 (two) times daily. 02/18/22   Jacquelin Hawking, PA-C  naproxen (NAPROSYN) 500 MG tablet Take 1 tablet (500 mg total) by mouth 2 (two) times daily as needed. 05/21/22   Jacquelin Hawking, PA-C  nicotine polacrilex (NICORETTE) 4 MG gum Take 1 each (4 mg total) by mouth as needed for smoking cessation. 06/26/22   Gardenia Phlegm, MD  nitroGLYCERIN (NITROSTAT) 0.4 MG SL tablet Place 1 tablet (0.4 mg total) under the tongue every 5 (five) minutes as needed for chest pain. 10/29/22   Billie Lade, MD  pantoprazole (PROTONIX) 40 MG tablet Take 1 tablet (40 mg total) by mouth daily. 10/29/22   Billie Lade, MD  sertraline (ZOLOFT) 100 MG tablet Take 100 mg by mouth daily.    [provider]  traZODone (DESYREL) 50 MG tablet Take 25-50 mg by mouth at bedtime as needed for sleep. 03/18/22   [provider]  Varenicline Tartrate, Starter, (CHANTIX STARTING MONTH PAK) 0.5 MG X 11 & 1 MG X 42 TBPK Days 1 to 3: 0.5 mg once daily. Days 4 to 7: 0.5 mg twice daily. Then 1 mg twice daily  10/29/22   Billie Lade, MD     Allergies    Patient has no known allergies.   Review of Systems   Review of Systems Please see HPI for pertinent positives and negatives  Physical Exam BP (!) 144/73 (BP Location: Right Arm)   Pulse 97   Temp 98.1 F (36.7 C) (Oral)   Resp 17   Ht 6\' 6"  (1.981 m)   Wt 116.5 kg   SpO2 95%   BMI 29.68 kg/m   Physical Exam Vitals and nursing note reviewed.  HENT:     Head: Normocephalic.     Nose: Nose normal.  Eyes:     Extraocular Movements: Extraocular movements intact.  Cardiovascular:     Pulses: Normal pulses.  Pulmonary:     Effort: Pulmonary effort is normal.  Musculoskeletal:        General: Swelling (diffuse soft  tissue swelling, no bruising) and tenderness (dorsal R ankle) present. No deformity.     Cervical back: Neck supple.  Skin:    Findings: No rash (on exposed skin).  Neurological:     Mental Status: He is alert and oriented to person, place, and time.  Psychiatric:        Mood and Affect: Mood normal.     ED Results / Procedures / Treatments   EKG None  Procedures Procedures  Medications Ordered in the ED Medications  HYDROcodone-acetaminophen (NORCO/VICODIN) 5-325 MG per tablet 1 tablet (has no administration in time range)    Initial Impression and Plan  Patient here with R ankle injury. I personally viewed the images from radiology studies and agree with radiologist interpretation: Xray is neg for fracture. Suspect a ligament injury. Plan cam walker, crutches, pain medication and follow up with Ortho.   ED Course       MDM Rules/Calculators/A&P Medical Decision Making Problems Addressed: Sprain of right ankle, unspecified ligament, initial encounter: acute illness or injury  Amount and/or Complexity of Data Reviewed Radiology: ordered and independent interpretation performed. Decision-making details documented in ED Course.  Risk Prescription drug management.     Final Clinical Impression(s) / ED Diagnoses Final diagnoses:  Sprain of right ankle, unspecified ligament, initial encounter    Rx / DC Orders ED Discharge Orders          Ordered    HYDROcodone-acetaminophen (NORCO/VICODIN) 5-325 MG tablet  Every 6 hours PRN        12/18/22 0003             Pollyann Savoy, MD 12/18/22 0003

## 2022-12-19 ENCOUNTER — Telehealth: Payer: Self-pay

## 2022-12-19 NOTE — Transitions of Care (Post Inpatient/ED Visit) (Signed)
12/19/2022  Name: Timothy Nielsen MRN: [id number] DOB: 1971/05/26  Today's TOC FU Call Status: Today's TOC FU Call Status:: Successful TOC FU Call Competed TOC FU Call Complete Date: 12/19/22  Transition Care Management Follow-up Telephone Call Date of Discharge: 12/18/22 [facility] Type of Discharge: Emergency Department Reason for ED Visit: Orthopedic Conditions Orthopedic/Injury Diagnosis: Sprain or Strain (Sprain of right ankle) How have you been since you were released from the hospital?: Better Any questions or concerns?: No  Items Reviewed: Did you receive and understand the discharge instructions provided?: Yes Medications obtained,verified, and reconciled?: Yes (Medications Reviewed) Any new allergies since your discharge?: No Dietary orders reviewed?: Yes Do you have support at home?: Yes  Medications Reviewed Today: Medications Reviewed Today     Reviewed by Merleen Nicely, LPN (Licensed Practical Nurse) on 12/19/22 at 1158  Med List Status: <None>   Medication Order Taking? Sig Documenting Provider Last Dose Status Informant  albuterol (VENTOLIN HFA) 108 (90 Base) MCG/ACT inhaler [id number] Yes INHALE 2 PUFFS BY MOUTH EVERY 6 HOURS AS NEEDED FOR COUGHING, WHEEZING, OR SHORTNESS OF BREATH Jacquelin Hawking, PA-C Taking Active   aspirin EC 81 MG tablet [id number] Yes Take 81 mg by mouth daily. Swallow whole. [provider] Taking Active Self  atorvastatin (LIPITOR) 80 MG tablet [id number] Yes Take 1 tablet (80 mg total) by mouth daily. Billie Lade, MD Taking Active   budesonide-formoterol [facility]) 160-4.5 MCG/ACT inhaler [id number] Yes Inhale 2 puffs into the lungs 2 (two) times daily. Billie Lade, MD Taking Active   Cholecalciferol (VITAMIN D3) 25 MCG (1000 UT) CAPS [id number] Yes Take 1 capsule (1,000 Units total) by mouth daily. Gardenia Phlegm, MD Taking Active   clopidogrel (PLAVIX) 75 MG tablet [id number] Yes Take 1 tablet  (75 mg total) by mouth daily. Gardenia Phlegm, MD Taking Active   Cyanocobalamin 1500 MCG TBDP [id number] Yes Take 3,000 mcg by mouth in the morning and at bedtime. [provider] Taking Active Self  ezetimibe (ZETIA) 10 MG tablet [id number] Yes Take 1 tablet (10 mg total) by mouth daily. Gardenia Phlegm, MD Taking Active   gabapentin (NEURONTIN) 300 MG capsule [id number] Yes TAKE 1 CAPSULE BY MOUTH 2 TIMES DAILY Billie Lade, MD Taking Active   HYDROcodone-acetaminophen (NORCO/VICODIN) 5-325 MG tablet [id number] Yes Take 1 tablet by mouth every 6 (six) hours as needed for severe pain. Pollyann Savoy, MD Taking Active   lisinopril-hydrochlorothiazide (ZESTORETIC) 20-12.5 MG tablet [id number] Yes TAKE 1 Tablet BY MOUTH ONCE EVERY DAY Billie Lade, MD Taking Active   metoprolol tartrate (LOPRESSOR) 25 MG tablet [id number] Yes Take 1 tablet (25 mg total) by mouth 2 (two) times daily. Jacquelin Hawking, PA-C Taking Active   naproxen (NAPROSYN) 500 MG tablet [id number] Yes Take 1 tablet (500 mg total) by mouth 2 (two) times daily as needed. Jacquelin Hawking, PA-C Taking Active   nicotine polacrilex (NICORETTE) 4 MG gum [id number] Yes Take 1 each (4 mg total) by mouth as needed for smoking cessation. Gardenia Phlegm, MD Taking Active   nitroGLYCERIN (NITROSTAT) 0.4 MG SL tablet [id number] Yes Place 1 tablet (0.4 mg total) under the tongue every 5 (five) minutes as needed for chest pain. Billie Lade, MD Taking Active   pantoprazole (PROTONIX) 40 MG tablet [id number] Yes Take 1 tablet (40 mg total) by mouth daily. Billie Lade, MD Taking Active   sertraline (ZOLOFT) 100 MG tablet [id number] Yes Take 100 mg by mouth daily.  [provider] Taking Active   traZODone (DESYREL) 50 MG tablet [id number] Yes Take 25-50 mg by mouth at bedtime as needed for sleep. [provider] Taking Active   Varenicline Tartrate, Starter, (CHANTIX STARTING MONTH PAK) 0.5 MG X 11 & 1 MG X 42 TBPK  [id number] Yes Days 1 to 3: 0.5 mg once daily. Days 4 to 7: 0.5 mg twice daily. Then 1 mg twice daily Billie Lade, MD Taking Active             Home Care and Equipment/Supplies: Were Home Health Services Ordered?: No Any new equipment or medical supplies ordered?: No  Functional Questionnaire: Do you need assistance with bathing/showering or dressing?: No Do you need assistance with meal preparation?: No Do you need assistance with eating?: No Do you have difficulty maintaining continence: No Do you need assistance with getting out of bed/getting out of a chair/moving?: No Do you have difficulty managing or taking your medications?: No  Follow up appointments reviewed: PCP Follow-up appointment confirmed?: No (declined) MD Provider Line Number:[phone] Given: Yes Specialist Hospital Follow-up appointment confirmed?: NA Do you need transportation to your follow-up appointment?: No Do you understand care options if your condition(s) worsen?: Yes-patient verbalized understanding    SIGNATURE  Woodfin Ganja LPN [facility] Nurse Health Advisor Direct Dial [phone]

## 2022-12-24 ENCOUNTER — Ambulatory Visit (HOSPITAL_COMMUNITY): Payer: Medicaid Other

## 2022-12-27 ENCOUNTER — Other Ambulatory Visit (HOSPITAL_BASED_OUTPATIENT_CLINIC_OR_DEPARTMENT_OTHER): Payer: Self-pay

## 2022-12-30 ENCOUNTER — Other Ambulatory Visit: Payer: Self-pay

## 2022-12-30 ENCOUNTER — Other Ambulatory Visit: Payer: Self-pay | Admitting: Internal Medicine

## 2022-12-30 DIAGNOSIS — G629 Polyneuropathy, unspecified: Secondary | ICD-10-CM

## 2022-12-30 MED ORDER — METOPROLOL TARTRATE 25 MG PO TABS: 25.0000 mg | ORAL_TABLET | Freq: Two times a day (BID) | ORAL | 1 refills | Status: DC

## 2022-12-30 NOTE — Telephone Encounter (Signed)
Refills sent

## 2023-01-01 ENCOUNTER — Ambulatory Visit (HOSPITAL_COMMUNITY)
Admission: RE | Admit: 2023-01-01 | Discharge: 2023-01-01 | Disposition: A | Discharge status: Home / Self Care | Payer: Medicare Other | Source: Ambulatory Visit | Attending: Cardiovascular Disease | Admitting: Cardiovascular Disease

## 2023-01-01 DIAGNOSIS — I739 Peripheral vascular disease, unspecified: Secondary | ICD-10-CM | POA: Insufficient documentation

## 2023-01-01 LAB — VAS US ABI WITH/WO TBI
Left ABI: 0.96
Right ABI: 0.72

## 2023-01-15 ENCOUNTER — Encounter: Payer: Self-pay | Admitting: Cardiology

## 2023-01-15 ENCOUNTER — Ambulatory Visit: Payer: Medicare Other | Attending: Cardiology | Admitting: Cardiology

## 2023-01-15 VITALS — BP 146/88 | HR 76 | Ht 78.0 in | Wt 265.4 lb

## 2023-01-15 DIAGNOSIS — I739 Peripheral vascular disease, unspecified: Secondary | ICD-10-CM | POA: Insufficient documentation

## 2023-01-15 DIAGNOSIS — I2510 Atherosclerotic heart disease of native coronary artery without angina pectoris: Secondary | ICD-10-CM | POA: Insufficient documentation

## 2023-01-15 DIAGNOSIS — E782 Mixed hyperlipidemia: Secondary | ICD-10-CM | POA: Insufficient documentation

## 2023-01-15 DIAGNOSIS — I10 Essential (primary) hypertension: Secondary | ICD-10-CM | POA: Diagnosis present

## 2023-01-15 NOTE — Progress Notes (Signed)
Cardiology Office Note  Date: 01/15/2023   ID: Timothy Nielsen, DOB 12-31-1970, MRN [id number]  History of Present Illness: Timothy Nielsen is a 52 y.o. male last seen in December 2023.  He is here for a routine visit.  Reports bilateral claudication, using a cane to ambulate. Recent lower extremity arterial studies are noted below, he has follow-up scheduled with Dr. Allyson Sabal next week.  Otherwise, from a cardiac perspective he does not describe any definite angina, has some discomfort in his left upper arm which sounds more neuropathic and is nonexertional.  He does have multivessel coronary artery calcification evident by CT imaging with Lexiscan Myoview in March of last year showing no ischemia and concurrent echocardiogram demonstrating normal LVEF at 60 to 65%.  Blood pressure was elevated today, he states that he checks this at home with systolics generally in the 120s to 140s.  I went over his medications, he reports compliance with therapy.  LDL was 60 recently on Lipitor.  ECG today shows normal sinus rhythm.   Physical Exam: VS:  BP (!) 146/88 (BP Location: Right Arm)   Pulse 76   Ht 6\' 6"  (1.981 m)   Wt 265 lb 6.4 oz (120.4 kg)   SpO2 99%   BMI 30.67 kg/m , BMI Body mass index is 30.67 kg/m.  Wt Readings from Last 3 Encounters:  01/15/23 265 lb 6.4 oz (120.4 kg)  12/17/22 256 lb 13.4 oz (116.5 kg)  10/29/22 256 lb 12.8 oz (116.5 kg)    General: Patient appears comfortable at rest. HEENT: Conjunctiva and lids normal. Neck: Supple, no elevated JVP or carotid bruits. Lungs: Clear to auscultation, nonlabored breathing at rest. Cardiac: Regular rate and rhythm, no S3 or significant systolic murmur. Extremities: No pitting edema, no ulcerations.  ECG:  An ECG dated 10/17/2021 was personally reviewed today and demonstrated:  Sinus rhythm with lead motion artifact.  Labwork: 02/15/2022: ALT 53; AST 29; BUN 9; Creatinine, Ser 0.79; Potassium 4.1; Sodium 136 07/02/2022: Hemoglobin  16.8; Platelets 245; TSH 1.433     Component Value Date/Time   CHOL 123 11/13/2022 0954   TRIG 123 11/13/2022 0954   HDL 41 11/13/2022 0954   CHOLHDL 3.0 11/13/2022 0954   CHOLHDL 3.3 07/02/2022 1056   VLDL 22 07/02/2022 1056   LDLCALC 60 11/13/2022 0954   Other Studies Reviewed Today:  ABI 01/01/2023: Right 0.72, left 0.96  Arterial Dopplers 01/01/2023: Summary:  Right: 30-49% stenosis noted in the common femoral artery. 75-99% stenosis  noted in the superficial femoral artery, s/p atherectomy. Moderate  progression is noted compared to previous study.   Assessment and Plan:  1.  PAD status post directional atherectomy and drug-coated balloon angioplasty of the right SFA in May 2023.  Recent right ABI decreased to 0.72 with evidence of SFA stenosis on that side as well.  He reports bilateral claudication however.  Follow-up pending with Dr. Allyson Sabal next week.  Continue aspirin, Plavix and Lipitor.  2.  Essential hypertension.  Blood pressure up today but home systolics in the 120s to 140s.  For now would continue current dose of Zestoretic realizing that there is room for up titration if needed, also on Lopressor.  3.  Mixed hyperlipidemia.  LDL 60 in May.  Continue Lipitor and Zetia.  4.  Multivessel coronary artery calcification evident by CT imaging.  No active ischemia by Eugenie Birks Myoview last year and LVEF normal at 60 to 65% at that time as well.  Disposition:  Follow up  6  months.  Signed, Jonelle Sidle, M.D., F.A.C.C. [facility]

## 2023-01-15 NOTE — Patient Instructions (Signed)

## 2023-01-22 ENCOUNTER — Encounter: Payer: Self-pay | Admitting: Cardiovascular Disease

## 2023-01-22 ENCOUNTER — Ambulatory Visit: Payer: Medicare Other | Admitting: Cardiovascular Disease

## 2023-01-22 VITALS — BP 124/78 | HR 75 | Ht 78.0 in | Wt 265.8 lb

## 2023-01-22 DIAGNOSIS — I739 Peripheral vascular disease, unspecified: Secondary | ICD-10-CM

## 2023-01-22 NOTE — Progress Notes (Signed)
01/22/2023 Timothy Nielsen   03-03-1971  [id number]  Primary Physician Billie Lade, MD Primary Cardiologist: Runell Gess MD Nicholes Calamity, [place]  HPI:  Timothy Nielsen is a 52 y.o.  mildly overweight divorced Caucasian male with no children who is currently unemployed but previously worked for a tree company.  He was referred through the courtesy of Dr. Diona Browner, his cardiologist, for evaluation of lifestyle-limiting claudication.  I last saw him in the office 03/19/2022.  His risk factors include 35 pack years of tobacco abuse currently smoking 1/2 pack/day with the intent to quit.  He has treated hypertension and hyperlipidemia.  Both his parents had coronary intervention.  He is never had a heart attack or stroke.  He does complain of shortness of breath and occasional chest tightness.  He has COPD.  Recent had a 2D echo that was normal and a Myoview stress test that was nonischemic.  He has had lifestyle-limiting claudication for at least a year right greater than left.  He had Doppler studies performed at [facility] 07/26/2021 revealing right ABI of 0.69 and a left of 1.16.  I performed lower extremity arterial Doppler studies on him 10/04/2021 revealing a right ABI of 0.65 with an occluded right SFA.  I ultimately performed peripheral angiography and endovascular therapy on him 10/29/2021.  He had a right SFA CTO and had directional atherectomy followed by drug-coated balloon angioplasty.  He did have a small hematoma in his left groin which is resolving.  His Doppler studies performed 11/15/2021 revealed a widely patent right SFA.  His ABI increased from 0.65-0.80.  His claudication has significantly improved.  He did stop smoking briefly but has gone back to smoking 4 to 6 cigarettes a day.   Since I saw him almost a year ago he did have recent Doppler studies performed 01/01/2023 revealing a decline in his right ABI to 0.72 with a left ABI of 0.96.  He does have a high-frequency  signal in his distal right SFA consistent with restenosis.  He has stopped smoking.  He says his claudication currently is not severe enough to warrant reintervention.  He does complain of some back pain as well as some left upper extremity pain which sounds radicular.   Current Meds  Medication Sig   albuterol (VENTOLIN HFA) 108 (90 Base) MCG/ACT inhaler INHALE 2 PUFFS BY MOUTH EVERY 6 HOURS AS NEEDED FOR COUGHING, WHEEZING, OR SHORTNESS OF BREATH   aspirin EC 81 MG tablet Take 81 mg by mouth daily. Swallow whole.   atorvastatin (LIPITOR) 80 MG tablet Take 1 tablet (80 mg total) by mouth daily.   budesonide-formoterol (SYMBICORT) 160-4.5 MCG/ACT inhaler Inhale 2 puffs into the lungs 2 (two) times daily.   Cholecalciferol (VITAMIN D3) 25 MCG (1000 UT) CAPS Take 1 capsule (1,000 Units total) by mouth daily.   clopidogrel (PLAVIX) 75 MG tablet Take 1 tablet (75 mg total) by mouth daily.   Cyanocobalamin 1500 MCG TBDP Take 3,000 mcg by mouth in the morning and at bedtime.   ezetimibe (ZETIA) 10 MG tablet Take 1 tablet (10 mg total) by mouth daily.   gabapentin (NEURONTIN) 300 MG capsule Take 1 capsule by mouth twice daily   lisinopril-hydrochlorothiazide (ZESTORETIC) 20-12.5 MG tablet TAKE 1 Tablet BY MOUTH ONCE EVERY DAY   metoprolol tartrate (LOPRESSOR) 25 MG tablet Take 1 tablet (25 mg total) by mouth 2 (two) times daily.   naproxen (NAPROSYN) 500 MG tablet Take 1 tablet (500 mg total)  by mouth 2 (two) times daily as needed.   nitroGLYCERIN (NITROSTAT) 0.4 MG SL tablet Place 1 tablet (0.4 mg total) under the tongue every 5 (five) minutes as needed for chest pain.   pantoprazole (PROTONIX) 40 MG tablet Take 1 tablet (40 mg total) by mouth daily.   sertraline (ZOLOFT) 50 MG tablet Take 50 mg by mouth daily.     No Known Allergies  Social History   Socioeconomic History   Marital status: Single    Spouse name: Not on file   Number of children: 0   Years of education: Not on file   Highest  education level: Not on file  Occupational History   Not on file  Tobacco Use   Smoking status: Former    Current packs/day: 0.00    Average packs/day: 1 pack/day for 37.0 years (37.0 ttl pk-yrs)    Types: Cigarettes    Start date: 11/14/1986    Quit date: 07/22/2022    Years since quitting: 0.5   Smokeless tobacco: Never  Vaping Use   Vaping status: Never Used  Substance and Sexual Activity   Alcohol use: Yes    Alcohol/week: 18.0 standard drinks of alcohol    Types: 18 Cans of beer per week    Comment: 12 pack a week   Drug use: No   Sexual activity: Not on file  Other Topics Concern   Not on file  Social History Narrative   Right Handed    Lives in a one story home    Social Determinants of Health   Financial Resource Strain: Medium Risk (03/21/2022)   Overall Financial Resource Strain (CARDIA)    Difficulty of Paying Living Expenses: Somewhat hard  Food Insecurity: No Food Insecurity (03/21/2022)   Hunger Vital Sign    Worried About Running Out of Food in the Last Year: Never true    Ran Out of Food in the Last Year: Never true  Transportation Needs: No Transportation Needs (03/21/2022)   PRAPARE - Administrator, Civil Service (Medical): No    Lack of Transportation (Non-Medical): No  Physical Activity: Not on file  Stress: Not on file  Social Connections: Not on file  Intimate Partner Violence: Not on file     Review of Systems: General: negative for chills, fever, night sweats or weight changes.  Cardiovascular: negative for chest pain, dyspnea on exertion, edema, orthopnea, palpitations, paroxysmal nocturnal dyspnea or shortness of breath Dermatological: negative for rash Respiratory: negative for cough or wheezing Urologic: negative for hematuria Abdominal: negative for nausea, vomiting, diarrhea, bright red blood per rectum, melena, or hematemesis Neurologic: negative for visual changes, syncope, or dizziness All other systems reviewed and are  otherwise negative except as noted above.    Blood pressure 124/78, pulse 75, height 6\' 6"  (1.981 m), weight 265 lb 12.8 oz (120.6 kg), SpO2 96%.  General appearance: alert and no distress Neck: no adenopathy, no carotid bruit, no JVD, supple, symmetrical, trachea midline, and thyroid not enlarged, symmetric, no tenderness/mass/nodules Lungs: clear to auscultation bilaterally Heart: regular rate and rhythm, S1, S2 normal, no murmur, click, rub or gallop Extremities: extremities normal, atraumatic, no cyanosis or edema Pulses: 2+ and symmetric Skin: Skin color, texture, turgor normal. No rashes or lesions Neurologic: Grossly normal  EKG not performed today      ASSESSMENT AND PLAN:   Peripheral arterial disease (HCC) History of PAD status post right SFA intervention with directional atherectomy followed by drug-coated balloon angioplasty 10/29/2021 with follow-up  Dopplers performed 2 weeks later that showed a widely patent SFA with improved ABIs.  He has subsequently stopped smoking thankfully.  Most recent Dopplers performed 01/01/2023 revealed a decline in his right ABI to 0.72.  Left ABI 0.96.  He does have a high-frequency signal in his distal right SFA.  He complains of some mild claudication but does not feel that it severe enough to warrant reintervention at the current time.  Will continue to follow him noninvasively.     Runell Gess MD FACP,FACC,FAHA, [facility] 01/22/2023 11:10 AM

## 2023-01-22 NOTE — Patient Instructions (Signed)
Medication Instructions:  Your physician recommends that you continue on your current medications as directed. Please refer to the Current Medication list given to you today.  *If you need a refill on your cardiac medications before your next appointment, please call your pharmacy*   Testing/Procedures: Your physician has requested that you have a lower extremity arterial duplex. During this test, ultrasound is used to evaluate arterial blood flow in the legs. Allow one hour for this exam. There are no restrictions or special instructions. This will take place at [address], [address].  **To do in July 2025**  Your physician has requested that you have an ankle brachial index (ABI). During this test an ultrasound and blood pressure cuff are used to evaluate the arteries that supply the arms and legs with blood. Allow thirty minutes for this exam. There are no restrictions or special instructions. This will take place at [address] [facility] [address] [facility], [address].   **To do in July 2025**     Follow-Up: At [facility], you and your health needs are our priority.  As part of our continuing mission to provide you with exceptional heart care, we have created designated Provider Care Teams.  These Care Teams include your primary Cardiologist (physician) and Advanced Practice Providers (APPs -  Physician Assistants and Nurse Practitioners) who all work together to provide you with the care you need, when you need it.  We recommend signing up for the patient portal called "MyChart".  Sign up information is provided on this After Visit Summary.  MyChart is used to connect with patients for Virtual Visits (Telemedicine).  Patients are able to view lab/test results, encounter notes, upcoming appointments, etc.  Non-urgent messages can be sent to your provider as well.   To learn more about what you can do with MyChart, go to [email or web address].    Your next appointment:   12  month(s)  Provider:   [name], MD

## 2023-01-22 NOTE — Assessment & Plan Note (Signed)
History of PAD status post right SFA intervention with directional atherectomy followed by drug-coated balloon angioplasty 10/29/2021 with follow-up Dopplers performed 2 weeks later that showed a widely patent SFA with improved ABIs.  He has subsequently stopped smoking thankfully.  Most recent Dopplers performed 01/01/2023 revealed a decline in his right ABI to 0.72.  Left ABI 0.96.  He does have a high-frequency signal in his distal right SFA.  He complains of some mild claudication but does not feel that it severe enough to warrant reintervention at the current time.  Will continue to follow him noninvasively.

## 2023-01-30 ENCOUNTER — Ambulatory Visit: Payer: Medicaid Other | Admitting: Internal Medicine

## 2023-02-07 ENCOUNTER — Other Ambulatory Visit: Payer: Self-pay | Admitting: Internal Medicine

## 2023-02-07 DIAGNOSIS — I10 Essential (primary) hypertension: Secondary | ICD-10-CM

## 2023-02-27 ENCOUNTER — Other Ambulatory Visit: Payer: Self-pay

## 2023-02-27 DIAGNOSIS — E559 Vitamin D deficiency, unspecified: Secondary | ICD-10-CM

## 2023-02-27 MED ORDER — VITAMIN D3 25 MCG (1000 UT) PO CAPS: 1000.0000 [IU] | ORAL_CAPSULE | Freq: Every day | ORAL | 1 refills | Status: DC

## 2023-03-01 ENCOUNTER — Other Ambulatory Visit: Payer: Self-pay | Admitting: Internal Medicine

## 2023-03-01 DIAGNOSIS — G629 Polyneuropathy, unspecified: Secondary | ICD-10-CM

## 2023-03-25 ENCOUNTER — Encounter: Payer: Self-pay | Admitting: Internal Medicine

## 2023-03-25 ENCOUNTER — Ambulatory Visit (INDEPENDENT_AMBULATORY_CARE_PROVIDER_SITE_OTHER): Payer: Medicare Other | Admitting: Internal Medicine

## 2023-03-25 VITALS — BP 108/70 | HR 87 | Resp 18 | Ht 78.0 in | Wt 266.0 lb

## 2023-03-25 DIAGNOSIS — J45909 Unspecified asthma, uncomplicated: Secondary | ICD-10-CM

## 2023-03-25 DIAGNOSIS — I739 Peripheral vascular disease, unspecified: Secondary | ICD-10-CM

## 2023-03-25 DIAGNOSIS — G629 Polyneuropathy, unspecified: Secondary | ICD-10-CM | POA: Diagnosis not present

## 2023-03-25 DIAGNOSIS — I2584 Coronary atherosclerosis due to calcified coronary lesion: Secondary | ICD-10-CM

## 2023-03-25 DIAGNOSIS — I10 Essential (primary) hypertension: Secondary | ICD-10-CM | POA: Diagnosis not present

## 2023-03-25 DIAGNOSIS — I2510 Atherosclerotic heart disease of native coronary artery without angina pectoris: Secondary | ICD-10-CM | POA: Insufficient documentation

## 2023-03-25 DIAGNOSIS — E7800 Pure hypercholesterolemia, unspecified: Secondary | ICD-10-CM

## 2023-03-25 DIAGNOSIS — F32A Depression, unspecified: Secondary | ICD-10-CM

## 2023-03-25 DIAGNOSIS — F419 Anxiety disorder, unspecified: Secondary | ICD-10-CM | POA: Insufficient documentation

## 2023-03-25 DIAGNOSIS — K219 Gastro-esophageal reflux disease without esophagitis: Secondary | ICD-10-CM

## 2023-03-25 MED ORDER — ATORVASTATIN CALCIUM 80 MG PO TABS: 80.0000 mg | ORAL_TABLET | Freq: Every day | ORAL | 0 refills | Status: DC

## 2023-03-25 MED ORDER — LISINOPRIL-HYDROCHLOROTHIAZIDE 20-12.5 MG PO TABS: ORAL_TABLET | ORAL | 0 refills | Status: DC

## 2023-03-25 MED ORDER — PANTOPRAZOLE SODIUM 40 MG PO TBEC: 40.0000 mg | DELAYED_RELEASE_TABLET | Freq: Every day | ORAL | 0 refills | Status: DC

## 2023-03-25 MED ORDER — GABAPENTIN 300 MG PO CAPS: ORAL_CAPSULE | ORAL | 0 refills | Status: DC

## 2023-03-25 MED ORDER — SERTRALINE HCL 50 MG PO TABS: 50.0000 mg | ORAL_TABLET | Freq: Every day | ORAL | 0 refills | Status: DC

## 2023-03-25 NOTE — Assessment & Plan Note (Signed)
 Remains adequately controlled on current antihypertensive regimen.  No medication changes are indicated today.

## 2023-03-25 NOTE — Assessment & Plan Note (Signed)
Mood remains stable and anxiety adequately controlled with sertraline.  No medication changes are indicated today.

## 2023-03-25 NOTE — Assessment & Plan Note (Signed)
Lipid panel updated in May.  Total cholesterol 123 and LDL 60.  He is currently prescribed atorvastatin 80 mg daily and Zetia 10 mg daily.  No medication changes are indicated today.

## 2023-03-25 NOTE — Assessment & Plan Note (Signed)
Evaluated by cardiology (Dr. Allyson Sabal) for follow-up in July.  Remains on DAPT and statin therapy.  Notable decline in right ABI.  Intervention was discussed, however Timothy Nielsen stated that his symptoms were not severe enough to warrant intervention.

## 2023-03-25 NOTE — Progress Notes (Signed)
Established Patient Office Visit  Subjective   Patient ID: Timothy Nielsen, male    DOB: [birth year]  Age: 52 y.o. MRN: [id number]  Chief Complaint  Patient presents with   Shortness of Breath    Has been giving out of breath more frequently for the past couple of months    Timothy Nielsen returns to care today for routine follow-up.  He was last evaluated by me on 5/7.  At that time Symbicort was started for maintenance inhaler therapy in the setting of asthma.  Chantix was also prescribed to help with smoking cessation.  He endorsed right upper quadrant abdominal pain and a right upper quadrant ultrasound was ordered for further evaluation.  No acute findings were identified.  Repeat labs were ordered and 24-month follow-up was arranged.  In the interim, he presented to the emergency department in June for evaluation of a right ankle injury.  He was seen by cardiology in July for follow-up.  Treated for acute maxillary sinusitis and urgent care in August.  There have otherwise been no acute interval events.  Timothy Nielsen endorses dull chest discomfort, shortness of breath, and diffuse sweating with exertion.  Symptoms are triggered by basic activities of daily living such as getting in the shower, walking to his truck, or taking out the trash.  Symptoms resolved with rest.  Of note, he completed a Lexi scan in March 2023 that did not demonstrate any evidence of ischemia or infarction.  He is currently on statin therapy as well as DAPT (ASA/Plavix).  He has tried using his albuterol inhaler to alleviate his symptoms, however this is largely ineffective.  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 SFA   Social History   Tobacco Use   Smoking status: Former    Current packs/day: 0.00    Average packs/day: 1 pack/day for 37.0 years (37.0 ttl pk-yrs)    Types: Cigarettes    Start date: 11/14/1986    Quit date: 07/22/2022    Years since quitting: 0.6   Smokeless tobacco: Never  Vaping Use   Vaping status: Never Used  Substance Use Topics   Alcohol use: Yes    Alcohol/week: 18.0 standard drinks of alcohol    Types: 18 Cans of beer per week    Comment: 12 pack a week   Drug use: No   Family History  Problem Relation Age of Onset   Diabetes Mother    Hypertension Mother    Alzheimer's disease Mother    Diabetes Father    No Known Allergies  Review of Systems  Constitutional:  Positive for diaphoresis (With exertion).  Respiratory:  Positive for shortness of breath (With exertion) and wheezing.   Cardiovascular:  Positive for chest pain (With exertion).     Objective:     BP 108/70   Pulse 87   Resp 18   Ht 6\' 6"  (1.981 m)   Wt 266 lb (120.7 kg)   SpO2 94%   BMI 30.74 kg/m  BP Readings from Last 3 Encounters:  03/25/23 108/70  01/22/23 124/78  01/15/23 (!) 146/88   Physical Exam Vitals reviewed.  Constitutional:      General: He is not in acute distress.    Appearance: Normal appearance. He is not ill-appearing.  HENT:     Head: Normocephalic and atraumatic.     Right Ear: External ear normal.     Left Ear: External ear normal.     Nose: Nose normal. No congestion or rhinorrhea.     Mouth/Throat:      Mouth: Mucous membranes are moist.     Pharynx: Oropharynx is clear.  Eyes:     General: No scleral icterus.    Extraocular Movements: Extraocular movements intact.     Conjunctiva/sclera: Conjunctivae normal.     Pupils: Pupils are equal, round, and reactive to light.  Cardiovascular:     Rate and Rhythm: Normal rate and regular rhythm.     Pulses: Normal pulses.     Heart sounds: Normal heart sounds. No murmur heard. Pulmonary:     Effort: Pulmonary effort is normal.     Breath sounds: Normal breath sounds. No wheezing, rhonchi or rales.  Abdominal:     General: Abdomen is flat. Bowel sounds are normal. There is no distension.     Palpations: Abdomen is soft.     Tenderness: There is no abdominal tenderness.  Musculoskeletal:        General: No swelling or deformity. Normal range of motion.     Cervical back: Normal range of motion.  Skin:    General: Skin is warm and dry.     Capillary Refill: Capillary refill takes less than 2 seconds.  Neurological:     General: No focal deficit present.     Mental Status: He is alert and oriented to person, place, and time.     Motor: No weakness.     Gait: Gait abnormal (Ambulates with cane).  Psychiatric:        Mood and Affect: Mood normal.        Behavior: Behavior normal.        Thought Content: Thought content normal.   Last CBC Lab Results  Component Value Date   WBC 10.3 07/02/2022   HGB 16.8 07/02/2022   HCT 48.6 07/02/2022   MCV 91.9 07/02/2022   MCH 31.8 07/02/2022   RDW 13.2 07/02/2022   PLT 245 07/02/2022   Last metabolic panel Lab Results  Component Value Date   GLUCOSE 125 (H) 02/15/2022   NA 136 02/15/2022   K 4.1 02/15/2022   CL 103 02/15/2022   CO2 27 02/15/2022   BUN 9 02/15/2022   CREATININE 0.79 02/15/2022   GFRNONAA >60 02/15/2022   CALCIUM 9.3 02/15/2022   PROT 7.2 02/15/2022   ALBUMIN 4.2 02/15/2022   LABGLOB 2.5 08/18/2020   AGRATIO 1.9 08/18/2020   BILITOT 1.0 02/15/2022   ALKPHOS 105  02/15/2022   AST 29 02/15/2022   ALT 53 (H) 02/15/2022   ANIONGAP 6 02/15/2022   Last lipids Lab Results  Component Value Date   CHOL 123 11/13/2022   HDL 41 11/13/2022   LDLCALC 60 11/13/2022   TRIG 123 11/13/2022  CHOLHDL 3.0 11/13/2022   Last hemoglobin A1c Lab Results  Component Value Date   HGBA1C 5.9 (H) 07/02/2022   Last thyroid functions Lab Results  Component Value Date   TSH 1.433 07/02/2022   Last vitamin D Lab Results  Component Value Date   VD25OH 45.5 11/13/2022   Last vitamin B12 and Folate Lab Results  Component Value Date   VITAMINB12 791 07/02/2022   FOLATE 9.4 07/02/2022     Assessment & Plan:   Problem List Items Addressed This Visit       Essential hypertension    Remains adequately controlled on current antihypertensive regimen.  No medication changes are indicated today.      Peripheral arterial disease (HCC)    Evaluated by cardiology (Dr. Allyson Sabal) for follow-up in July.  Remains on DAPT and statin therapy.  Notable decline in right ABI.  Intervention was discussed, however Timothy Nielsen stated that his symptoms were not severe enough to warrant intervention.      Coronary artery calcification of native artery    Multivessel coronary artery calcification noted on CT.  Followed by cardiology (Dr. Diona Browner).  Seen for follow-up in July.  He is currently on DAPT (ASA/Plavix) and statin therapy.  Lexi scan performed in March 2023 did not show any evidence of ischemia or infarction.  His acute concern today is symptoms of dyspnea, dull chest discomfort, and diaphoresis that occur with exertion.  Symptoms are not alleviated with albuterol use. -I recommended that Timothy Nielsen contact Dr. McDowell's office to arrange follow-up given persistence of his current symptoms.  No medication changes have been made today.      Asthma - Primary    Symbicort was started for maintenance therapy at his last appointment.  He is additionally prescribed albuterol for  as needed use.  Reports using albuterol roughly once per day, mostly in the morning when he wakes up.  Pulmonary exam is unremarkable today.  His acute concern today is dyspnea with exertion with associated chest discomfort and diaphoresis.  I currently have greater concern for cardiac vs pulmonary etiology and have recommended that he contact Dr. Diona Browner to arrange follow-up.  Consider referral to pulmonology if symptoms persist despite reassuring cardiac workup.  He will continue Symbicort and albuterol for now.      HLD (hyperlipidemia)    Lipid panel updated in May.  Total cholesterol 123 and LDL 60.  He is currently prescribed atorvastatin 80 mg daily and Zetia 10 mg daily.  No medication changes are indicated today.      Anxiety and depression    Mood remains stable and anxiety adequately controlled with sertraline.  No medication changes are indicated today.      Return in about 3 months (around 06/25/2023).   Billie Lade, MD

## 2023-03-25 NOTE — Assessment & Plan Note (Signed)
Symbicort was started for maintenance therapy at his last appointment.  He is additionally prescribed albuterol for as needed use.  Reports using albuterol roughly once per day, mostly in the morning when he wakes up.  Pulmonary exam is unremarkable today.  His acute concern today is dyspnea with exertion with associated chest discomfort and diaphoresis.  I currently have greater concern for cardiac vs pulmonary etiology and have recommended that he contact Dr. Diona Browner to arrange follow-up.  Consider referral to pulmonology if symptoms persist despite reassuring cardiac workup.  He will continue Symbicort and albuterol for now.

## 2023-03-25 NOTE — Patient Instructions (Signed)
It was a pleasure to see you today.  Thank you for giving [place] the opportunity to be involved in your care.  Below is a brief recap of your visit and next steps.  We will plan to see you again in 3 months.  Summary No medication changes today Follow up in 3 months I recommend contacting Dr. Ival Bible office to arrange follow up

## 2023-03-25 NOTE — Assessment & Plan Note (Signed)
Multivessel coronary artery calcification noted on CT.  Followed by cardiology (Dr. Diona Browner).  Seen for follow-up in July.  He is currently on DAPT (ASA/Plavix) and statin therapy.  Lexi scan performed in March 2023 did not show any evidence of ischemia or infarction.  His acute concern today is symptoms of dyspnea, dull chest discomfort, and diaphoresis that occur with exertion.  Symptoms are not alleviated with albuterol use. -I recommended that Timothy Nielsen contact Dr. McDowell's office to arrange follow-up given persistence of his current symptoms.  No medication changes have been made today.

## 2023-04-08 ENCOUNTER — Ambulatory Visit: Payer: Medicare Other | Attending: Cardiology | Admitting: Cardiology

## 2023-04-08 ENCOUNTER — Other Ambulatory Visit: Payer: Self-pay | Admitting: Cardiology

## 2023-04-08 ENCOUNTER — Telehealth: Payer: Self-pay | Admitting: Cardiology

## 2023-04-08 ENCOUNTER — Encounter: Payer: Self-pay | Admitting: Cardiology

## 2023-04-08 VITALS — BP 152/92 | HR 94 | Ht 78.0 in | Wt 266.8 lb

## 2023-04-08 DIAGNOSIS — E782 Mixed hyperlipidemia: Secondary | ICD-10-CM

## 2023-04-08 DIAGNOSIS — I10 Essential (primary) hypertension: Secondary | ICD-10-CM

## 2023-04-08 DIAGNOSIS — I739 Peripheral vascular disease, unspecified: Secondary | ICD-10-CM

## 2023-04-08 DIAGNOSIS — R0789 Other chest pain: Secondary | ICD-10-CM

## 2023-04-08 DIAGNOSIS — I2510 Atherosclerotic heart disease of native coronary artery without angina pectoris: Secondary | ICD-10-CM | POA: Diagnosis not present

## 2023-04-08 DIAGNOSIS — R0609 Other forms of dyspnea: Secondary | ICD-10-CM | POA: Diagnosis present

## 2023-04-08 DIAGNOSIS — Z01810 Encounter for preprocedural cardiovascular examination: Secondary | ICD-10-CM | POA: Diagnosis present

## 2023-04-08 NOTE — Progress Notes (Signed)
    Cardiology Office Note  Date: 04/08/2023   ID: Timothy Nielsen, DOB 06-Oct-1970, MRN [id number]  History of Present Illness: Timothy Nielsen is a 52 y.o. male last seen in July.  He is here for a follow-up visit.  Reports stable claudication, still using a cane.  Does describe intermittent exertional chest pressure, diaphoresis, and more fatigue particular within the last few weeks.  No palpitations or syncope.  I reviewed his medications.  Current cardiac regimen includes aspirin, Plavix, Lipitor, Zetia, Zestoretic, Lopressor, and as needed nitroglycerin.  Blood pressure elevated over baseline today.  He states that he has been taking his medications regularly.  Today we discussed his symptoms and plan for further evaluation of his coronaries.  He has known multivessel coronary artery calcification and did undergo a low risk Lexiscan Myoview last year.  Physical Exam: VS:  BP (!) 152/92 (BP Location: Right Arm)   Pulse 94   Ht 6\' 6"  (1.981 m)   Wt 266 lb 12.8 oz (121 kg)   SpO2 98%   BMI 30.83 kg/m , BMI Body mass index is 30.83 kg/m.  Wt Readings from Last 3 Encounters:  04/08/23 266 lb 12.8 oz (121 kg)  03/25/23 266 lb (120.7 kg)  01/22/23 265 lb 12.8 oz (120.6 kg)    General: Patient appears comfortable at rest. HEENT: Conjunctiva and lids normal. Neck: Supple, no elevated JVP or carotid bruits. Lungs: Clear to auscultation, nonlabored breathing at rest. Cardiac: Regular rate and rhythm, no S3 or significant systolic murmur, no pericardial rub. Abdomen: Soft, nontender, bowel sounds present. Extremities: No pitting edema, no sores or ulcerations.  ECG:  An ECG dated 01/15/2023 was personally reviewed today and demonstrated:  Sinus rhythm.  Labwork: 07/02/2022: Hemoglobin 16.8; Platelets 245; TSH 1.433     Component Value Date/Time   CHOL 123 11/13/2022 0954   TRIG 123 11/13/2022 0954   HDL 41 11/13/2022 0954   CHOLHDL 3.0 11/13/2022 0954   CHOLHDL 3.3 07/02/2022 1056    VLDL 22 07/02/2022 1056   LDLCALC 60 11/13/2022 0954   Other Studies Reviewed Today:  No interval cardiac testing for review today.  Assessment and Plan:  1.  Recent onset exertional chest pressure, intermittent diaphoresis, and dyspnea on exertion with fatigue as discussed above.  Given known multivessel coronary artery calcification by prior CT imaging we discussed further evaluation of coronary anatomy.  He did undergo a [place] Myoview which was low risk last year.  Symptoms worse since then.  We discussed the risks and benefits of a diagnostic cardiac catheterization and he is in agreement to proceed.  Continue aspirin, Plavix, Lipitor, Zetia, Lopressor, and Zestoretic.  2.  PAD status post directional atherectomy and drug-coated balloon angioplasty of the right SFA in May 2023.  ABI on right 0.72 and left 0.96 in July with interval follow-up by Dr. Allyson Sabal noted and plan for medical therapy unless claudication worsens.   3.  Primary hypertension.  Blood pressure elevated over baseline, stage II hypertension.  Could be associated with progressive CAD with further workup pending.  Plan to continue present regimen with adjustments as necessary following heart catheterization.   4.  Mixed hyperlipidemia.  LDL 60 in May.  Continue Lipitor and Zetia.   Disposition:  Follow up  after procedure.  Signed, Timothy Nielsen, M.D., F.A.C.C. [facility] at [facility]

## 2023-04-08 NOTE — Patient Instructions (Signed)
Medication Instructions:  Your physician recommends that you continue on your current medications as directed. Please refer to the Current Medication list given to you today.  Labwork: BMET & CBC today at [facility] in [place] Non-fasting  Testing/Procedures: Your physician has requested that you have a cardiac catheterization. Cardiac catheterization is used to diagnose and/or treat various heart conditions. Doctors may recommend this procedure for a number of different reasons. The most common reason is to evaluate chest pain. Chest pain can be a symptom of coronary artery disease (CAD), and cardiac catheterization can show whether plaque is narrowing or blocking your heart's arteries. This procedure is also used to evaluate the valves, as well as measure the blood flow and oxygen levels in different parts of your heart. For further information please visit [email or web address] Please follow instruction sheet, as given.  Follow-Up: Your physician recommends that you schedule a follow-up appointment in: 1 month  Any Other Special Instructions Will Be Listed Below (If Applicable).  If you need a refill on your cardiac medications before your next appointment, please call your pharmacy.   [facility] A DEPT OF [facility] [address] [place] [address] Dept: [phone] Loc: [phone]  Treylin Burtch  04/08/2023  You are scheduled for a Cardiac Catheterization on Monday, October 21 with Dr. Alverda Skeans.  1. Please arrive at the [facility] (Main Entrance A) at [facility]: [address] [place], [place] [address] at 7:00 AM (This time is 2 hour(s) before your procedure to ensure your preparation). Free valet parking service is available. You will check in at ADMITTING. The support person will be asked to wait in the waiting room.  It is OK to have someone drop you off and come back when you are ready to  be discharged.    Special note: Every effort is made to have your procedure done on time. Please understand that emergencies sometimes delay scheduled procedures.  2. Diet: Do not eat solid foods after midnight.  The patient may have clear liquids until 5am upon the day of the procedure.  3. Labs: You will need to have blood drawn on Tuesday, October 15 at [facility]. You do not need to be fasting.  4. Medication instructions in preparation for your procedure: You may take your medications on the morning of your cath with a sip of water except lisinopril/hydrochlorothiazide-hold this that morning   Contrast Allergy: No  On the morning of your procedure, take your Aspirin 81 mg and any morning medicines NOT listed above.  You may use sips of water.  5. Plan to go home the same day, you will only stay overnight if medically necessary. 6. Bring a current list of your medications and current insurance cards. 7. You MUST have a responsible person to drive you home. 8. Someone MUST be with you the first 24 hours after you arrive home or your discharge will be delayed. 9. Please wear clothes that are easy to get on and off and wear slip-on shoes.  Thank you for allowing [place] to care for you!   -- [facility]

## 2023-04-08 NOTE — H&P (View-Only) (Signed)
    Cardiology Office Note  Date: 04/08/2023   ID: Timothy Nielsen, DOB 06-Oct-1970, MRN [id number]  History of Present Illness: Timothy Nielsen is a 52 y.o. male last seen in July.  He is here for a follow-up visit.  Reports stable claudication, still using a cane.  Does describe intermittent exertional chest pressure, diaphoresis, and more fatigue particular within the last few weeks.  No palpitations or syncope.  I reviewed his medications.  Current cardiac regimen includes aspirin, Plavix, Lipitor, Zetia, Zestoretic, Lopressor, and as needed nitroglycerin.  Blood pressure elevated over baseline today.  He states that he has been taking his medications regularly.  Today we discussed his symptoms and plan for further evaluation of his coronaries.  He has known multivessel coronary artery calcification and did undergo a low risk Lexiscan Myoview last year.  Physical Exam: VS:  BP (!) 152/92 (BP Location: Right Arm)   Pulse 94   Ht 6\' 6"  (1.981 m)   Wt 266 lb 12.8 oz (121 kg)   SpO2 98%   BMI 30.83 kg/m , BMI Body mass index is 30.83 kg/m.  Wt Readings from Last 3 Encounters:  04/08/23 266 lb 12.8 oz (121 kg)  03/25/23 266 lb (120.7 kg)  01/22/23 265 lb 12.8 oz (120.6 kg)    General: Patient appears comfortable at rest. HEENT: Conjunctiva and lids normal. Neck: Supple, no elevated JVP or carotid bruits. Lungs: Clear to auscultation, nonlabored breathing at rest. Cardiac: Regular rate and rhythm, no S3 or significant systolic murmur, no pericardial rub. Abdomen: Soft, nontender, bowel sounds present. Extremities: No pitting edema, no sores or ulcerations.  ECG:  An ECG dated 01/15/2023 was personally reviewed today and demonstrated:  Sinus rhythm.  Labwork: 07/02/2022: Hemoglobin 16.8; Platelets 245; TSH 1.433     Component Value Date/Time   CHOL 123 11/13/2022 0954   TRIG 123 11/13/2022 0954   HDL 41 11/13/2022 0954   CHOLHDL 3.0 11/13/2022 0954   CHOLHDL 3.3 07/02/2022 1056    VLDL 22 07/02/2022 1056   LDLCALC 60 11/13/2022 0954   Other Studies Reviewed Today:  No interval cardiac testing for review today.  Assessment and Plan:  1.  Recent onset exertional chest pressure, intermittent diaphoresis, and dyspnea on exertion with fatigue as discussed above.  Given known multivessel coronary artery calcification by prior CT imaging we discussed further evaluation of coronary anatomy.  He did undergo a [place] Myoview which was low risk last year.  Symptoms worse since then.  We discussed the risks and benefits of a diagnostic cardiac catheterization and he is in agreement to proceed.  Continue aspirin, Plavix, Lipitor, Zetia, Lopressor, and Zestoretic.  2.  PAD status post directional atherectomy and drug-coated balloon angioplasty of the right SFA in May 2023.  ABI on right 0.72 and left 0.96 in July with interval follow-up by Dr. Allyson Sabal noted and plan for medical therapy unless claudication worsens.   3.  Primary hypertension.  Blood pressure elevated over baseline, stage II hypertension.  Could be associated with progressive CAD with further workup pending.  Plan to continue present regimen with adjustments as necessary following heart catheterization.   4.  Mixed hyperlipidemia.  LDL 60 in May.  Continue Lipitor and Zetia.   Disposition:  Follow up  after procedure.  Signed, Jonelle Sidle, M.D., F.A.C.C. [facility] at [facility]

## 2023-04-08 NOTE — Telephone Encounter (Signed)
Checking percert on the following patient   Left heart cath dx: CAD w/angina & DOE on Monday, 04/14/2023 @9 :00 am with Lynnette Caffey

## 2023-04-09 LAB — CBC
Hematocrit: 47.2 % (ref 37.5–51.0)
Hemoglobin: 15.7 g/dL (ref 13.0–17.7)
MCH: 30.9 pg (ref 26.6–33.0)
MCHC: 33.3 g/dL (ref 31.5–35.7)
MCV: 93 fL (ref 79–97)
Platelets: 260 10*3/uL (ref 150–450)
RBC: 5.08 x10E6/uL (ref 4.14–5.80)
RDW: 12.3 % (ref 11.6–15.4)
WBC: 7.9 10*3/uL (ref 3.4–10.8)

## 2023-04-09 LAB — BASIC METABOLIC PANEL
BUN/Creatinine Ratio: 9 (ref 9–20)
BUN: 9 mg/dL (ref 6–24)
CO2: 22 mmol/L (ref 20–29)
Calcium: 10.2 mg/dL (ref 8.7–10.2)
Chloride: 96 mmol/L (ref 96–106)
Creatinine, Ser: 1.01 mg/dL (ref 0.76–1.27)
Glucose: 127 mg/dL — ABNORMAL HIGH (ref 70–99)
Potassium: 4.2 mmol/L (ref 3.5–5.2)
Sodium: 133 mmol/L — ABNORMAL LOW (ref 134–144)
eGFR: 88 mL/min/{1.73_m2} (ref >59)

## 2023-04-10 ENCOUNTER — Telehealth: Payer: Self-pay | Admitting: *Deleted

## 2023-04-10 NOTE — Telephone Encounter (Signed)
Cardiac Catheterization scheduled at [facility] for: Monday April 14, 2023 9 AM Arrival time [facility] Main Entrance A at: 7 AM  Nothing to eat after midnight prior to procedure, clear liquids until 5 AM day of procedure.  Medication instructions: -Hold:  Lisinopril-hydrochlorothiazide-AM of procedure -Other usual morning medications can be taken with sips of water including aspirin 81 mg and Plavix 75 mg.  Plan to go home the same day, you will only stay overnight if medically necessary.  You must have responsible adult to drive you home.  Someone must be with you the first 24 hours after you arrive home.  Reviewed procedure instructions with patient.

## 2023-04-14 ENCOUNTER — Other Ambulatory Visit: Payer: Self-pay | Admitting: Cardiology

## 2023-04-14 ENCOUNTER — Encounter (HOSPITAL_COMMUNITY)
Admission: RE | Disposition: A | Discharge status: Home / Self Care | Payer: Self-pay | Source: Home / Self Care | Attending: Internal Medicine

## 2023-04-14 ENCOUNTER — Other Ambulatory Visit: Payer: Self-pay

## 2023-04-14 ENCOUNTER — Ambulatory Visit (HOSPITAL_COMMUNITY)
Admission: RE | Admit: 2023-04-14 | Discharge: 2023-04-14 | Disposition: A | Discharge status: Home / Self Care | Payer: Medicare Other | Attending: Internal Medicine | Admitting: Internal Medicine

## 2023-04-14 DIAGNOSIS — I739 Peripheral vascular disease, unspecified: Secondary | ICD-10-CM | POA: Insufficient documentation

## 2023-04-14 DIAGNOSIS — Z79899 Other long term (current) drug therapy: Secondary | ICD-10-CM | POA: Diagnosis not present

## 2023-04-14 DIAGNOSIS — I2510 Atherosclerotic heart disease of native coronary artery without angina pectoris: Secondary | ICD-10-CM | POA: Insufficient documentation

## 2023-04-14 DIAGNOSIS — E782 Mixed hyperlipidemia: Secondary | ICD-10-CM | POA: Insufficient documentation

## 2023-04-14 DIAGNOSIS — I10 Essential (primary) hypertension: Secondary | ICD-10-CM | POA: Diagnosis not present

## 2023-04-14 DIAGNOSIS — Z7902 Long term (current) use of antithrombotics/antiplatelets: Secondary | ICD-10-CM | POA: Diagnosis not present

## 2023-04-14 DIAGNOSIS — Z7982 Long term (current) use of aspirin: Secondary | ICD-10-CM | POA: Diagnosis not present

## 2023-04-14 DIAGNOSIS — R079 Chest pain, unspecified: Secondary | ICD-10-CM | POA: Diagnosis present

## 2023-04-14 HISTORY — PX: CORONARY PRESSURE/FFR STUDY: CATH118243

## 2023-04-14 HISTORY — PX: LEFT HEART CATH AND CORONARY ANGIOGRAPHY: CATH118249

## 2023-04-14 LAB — POCT ACTIVATED CLOTTING TIME: Activated Clotting Time: 275 s

## 2023-04-14 SURGERY — LEFT HEART CATH AND CORONARY ANGIOGRAPHY
Anesthesia: LOCAL

## 2023-04-14 MED ORDER — ADENOSINE 12 MG/4ML IV SOLN
INTRAVENOUS | Status: AC
  Filled 2023-04-14: qty 12

## 2023-04-14 MED ORDER — LIDOCAINE HCL (PF) 1 % IJ SOLN
INTRAMUSCULAR | Status: AC
  Filled 2023-04-14: qty 30

## 2023-04-14 MED ORDER — FUROSEMIDE 20 MG PO TABS: 20.0000 mg | ORAL_TABLET | Freq: Two times a day (BID) | ORAL | 11 refills | Status: DC

## 2023-04-14 MED ORDER — HEPARIN SODIUM (PORCINE) 1000 UNIT/ML IJ SOLN
INTRAMUSCULAR | Status: DC | PRN
  Administered 2023-04-14: 5000 [IU] via INTRAVENOUS
  Administered 2023-04-14: 8000 [IU] via INTRAVENOUS

## 2023-04-14 MED ORDER — SODIUM CHLORIDE 0.9 % WEIGHT BASED INFUSION: 1.0000 mL/kg/h | INTRAVENOUS | Status: DC

## 2023-04-14 MED ORDER — LABETALOL HCL 5 MG/ML IV SOLN: 10.0000 mg | INTRAVENOUS | Status: DC | PRN

## 2023-04-14 MED ORDER — HEPARIN SODIUM (PORCINE) 1000 UNIT/ML IJ SOLN
INTRAMUSCULAR | Status: AC
  Filled 2023-04-14: qty 10

## 2023-04-14 MED ORDER — MIDAZOLAM HCL 2 MG/2ML IJ SOLN
INTRAMUSCULAR | Status: AC
  Filled 2023-04-14: qty 2

## 2023-04-14 MED ORDER — FENTANYL CITRATE (PF) 100 MCG/2ML IJ SOLN
INTRAMUSCULAR | Status: AC
  Filled 2023-04-14: qty 2

## 2023-04-14 MED ORDER — LIDOCAINE HCL (PF) 1 % IJ SOLN
INTRAMUSCULAR | Status: DC | PRN
  Administered 2023-04-14: 2 mL

## 2023-04-14 MED ORDER — HYDRALAZINE HCL 20 MG/ML IJ SOLN: 10.0000 mg | INTRAMUSCULAR | Status: DC | PRN

## 2023-04-14 MED ORDER — MIDAZOLAM HCL 2 MG/2ML IJ SOLN
INTRAMUSCULAR | Status: DC | PRN
  Administered 2023-04-14: 1 mg via INTRAVENOUS

## 2023-04-14 MED ORDER — NITROGLYCERIN 1 MG/10 ML FOR IR/CATH LAB
INTRA_ARTERIAL | Status: AC
  Filled 2023-04-14: qty 10

## 2023-04-14 MED ORDER — SODIUM CHLORIDE 0.9% FLUSH: 3.0000 mL | INTRAVENOUS | Status: DC | PRN

## 2023-04-14 MED ORDER — SODIUM CHLORIDE 0.9 % IV SOLN: 250.0000 mL | INTRAVENOUS | Status: DC | PRN

## 2023-04-14 MED ORDER — FENTANYL CITRATE (PF) 100 MCG/2ML IJ SOLN
INTRAMUSCULAR | Status: DC | PRN
  Administered 2023-04-14: 25 ug via INTRAVENOUS

## 2023-04-14 MED ORDER — SODIUM CHLORIDE 0.9% FLUSH: 3.0000 mL | Freq: Two times a day (BID) | INTRAVENOUS | Status: DC

## 2023-04-14 MED ORDER — ASPIRIN 81 MG PO CHEW: 81.0000 mg | CHEWABLE_TABLET | ORAL | Status: DC

## 2023-04-14 MED ORDER — NITROGLYCERIN 1 MG/10 ML FOR IR/CATH LAB
INTRA_ARTERIAL | Status: DC | PRN
  Administered 2023-04-14: 200 ug via INTRA_ARTERIAL

## 2023-04-14 MED ORDER — HEPARIN (PORCINE) IN NACL 1000-0.9 UT/500ML-% IV SOLN
INTRAVENOUS | Status: DC | PRN
  Administered 2023-04-14 (×2): 500 mL

## 2023-04-14 MED ORDER — ONDANSETRON HCL 4 MG/2ML IJ SOLN: 4.0000 mg | Freq: Four times a day (QID) | INTRAMUSCULAR | Status: DC | PRN

## 2023-04-14 MED ORDER — ADENOSINE (DIAGNOSTIC) 140MCG/KG/MIN
INTRAVENOUS | Status: DC | PRN
  Administered 2023-04-14: 140 ug/kg/min via INTRAVENOUS

## 2023-04-14 MED ORDER — ACETAMINOPHEN 325 MG PO TABS
650.0000 mg | ORAL_TABLET | ORAL | Status: DC | PRN
Start: 2023-04-14 — End: 2023-04-14

## 2023-04-14 MED ORDER — SODIUM CHLORIDE 0.9 % WEIGHT BASED INFUSION
3.0000 mL/kg/h | INTRAVENOUS | Status: DC
  Administered 2023-04-14: 3 mL/kg/h via INTRAVENOUS

## 2023-04-14 SURGICAL SUPPLY — 13 items
CATH INFINITI 5FR ANG PIGTAIL (CATHETERS) IMPLANT
CATH INFINITI AMBI 6FR TG (CATHETERS) IMPLANT
CATH LAUNCHER 6FR JR4 (CATHETERS) IMPLANT
DEVICE RAD COMP TR BAND LRG (VASCULAR PRODUCTS) IMPLANT
ELECT DEFIB PAD ADLT CADENCE (PAD) IMPLANT
GLIDESHEATH SLEND SS 6F .021 (SHEATH) IMPLANT
GUIDEWIRE PRESSURE X 175 (WIRE) IMPLANT
KIT ESSENTIALS PG (KITS) IMPLANT
PACK CARDIAC CATHETERIZATION (CUSTOM PROCEDURE TRAY) ×1 IMPLANT
PROTECTION STATION PRESSURIZED (MISCELLANEOUS) ×1
SET ATX-X65L (MISCELLANEOUS) IMPLANT
STATION PROTECTION PRESSURIZED (MISCELLANEOUS) IMPLANT
WIRE EMERALD 3MM-J .035X260CM (WIRE) IMPLANT

## 2023-04-14 NOTE — Progress Notes (Signed)
BMET order placed per Dr. Lynnette Caffey

## 2023-04-14 NOTE — Interval H&P Note (Signed)
History and Physical Interval Note:  04/14/2023 7:12 AM  Timothy Nielsen  has presented today for surgery, with the diagnosis of cad - angina.  The various methods of treatment have been discussed with the patient and family. After consideration of risks, benefits and other options for treatment, the patient has consented to  Procedure(s): LEFT HEART CATH AND CORONARY ANGIOGRAPHY (N/A) as a surgical intervention.  The patient's history has been reviewed, patient examined, no change in status, stable for surgery.  I have reviewed the patient's chart and labs.  Questions were answered to the patient's satisfaction.     Orbie Pyo

## 2023-04-15 ENCOUNTER — Encounter (HOSPITAL_COMMUNITY): Payer: Self-pay | Admitting: Internal Medicine

## 2023-05-15 ENCOUNTER — Encounter: Payer: Self-pay | Admitting: Nurse Practitioner

## 2023-05-15 ENCOUNTER — Ambulatory Visit: Payer: Medicare Other | Attending: Nurse Practitioner | Admitting: Nurse Practitioner

## 2023-05-15 VITALS — BP 118/72 | HR 81 | Ht 78.0 in | Wt 271.0 lb

## 2023-05-15 DIAGNOSIS — I10 Essential (primary) hypertension: Secondary | ICD-10-CM | POA: Diagnosis present

## 2023-05-15 DIAGNOSIS — G4733 Obstructive sleep apnea (adult) (pediatric): Secondary | ICD-10-CM

## 2023-05-15 DIAGNOSIS — R0609 Other forms of dyspnea: Secondary | ICD-10-CM

## 2023-05-15 DIAGNOSIS — I739 Peripheral vascular disease, unspecified: Secondary | ICD-10-CM

## 2023-05-15 DIAGNOSIS — R61 Generalized hyperhidrosis: Secondary | ICD-10-CM

## 2023-05-15 DIAGNOSIS — R5383 Other fatigue: Secondary | ICD-10-CM | POA: Diagnosis present

## 2023-05-15 DIAGNOSIS — E782 Mixed hyperlipidemia: Secondary | ICD-10-CM | POA: Diagnosis present

## 2023-05-15 DIAGNOSIS — I25119 Atherosclerotic heart disease of native coronary artery with unspecified angina pectoris: Secondary | ICD-10-CM | POA: Diagnosis present

## 2023-05-15 DIAGNOSIS — R29818 Other symptoms and signs involving the nervous system: Secondary | ICD-10-CM | POA: Diagnosis present

## 2023-05-15 DIAGNOSIS — Z79899 Other long term (current) drug therapy: Secondary | ICD-10-CM

## 2023-05-15 DIAGNOSIS — R0602 Shortness of breath: Secondary | ICD-10-CM

## 2023-05-15 NOTE — Progress Notes (Signed)
Cardiology Office Note:  .   Date:  05/15/2023 ID:  Timothy Nielsen, DOB 04-10-1971, MRN [id number] PCP: Billie Lade, MD  [facility] Providers Cardiologist:  Nona Dell, MD    History of Present Illness: .   Timothy Nielsen is a 52 y.o. male with PMH of CAD, chest pressure, PAD, s/p directional atherectomy and drug-coated balloon angioplasty in May 2023, hypertension, mixed hyperlipidemia, DOE, and fatigue, who presents today for postcardiac catheterization follow-up.  Last seen by Dr. Diona Browner on April 08, 2023.  He noted stable claudication, was using a cane at the time.  Described intermittent exertional chest pressure, fatigue, diaphoresis within the last few weeks. Given his known multivessel coronary artery calcification by prior CT imaging, cardiac catheterization was arranged and revealed moderate right coronary artery disease with RFR of 0.98 and FFR of 0.93, intervention was deferred, also revealed assessment of coronary microvascular dysfunction that demonstrated a CFR of 3.1 and an index of microvascular resistance of 6 indicating no evidence of coronary microvascular dysfunction, medical therapy was recommended and was discharged on Lasix 20 mg twice daily and recommended to  check a BMP in 1 week to address his elevated LVEDP.   Today he presents for 1 month postcardiac catheterization follow-up.  He states his chest pain has improved from last office visit. Still notes feeling short of breath/winded with exertion. Says he has low energy. Says he has significant sweating when "doing anything." Denies any  palpitations, syncope, presyncope, dizziness, orthopnea, PND, swelling or significant weight changes, acute bleeding, or claudication. Does have some phlegm and coughs in the AM after getting up and when lying down at night. Stop BANG score is 5. He is a former smoker.   ROS: Negative. See HPI.   Studies Reviewed: [facility] Kitchen    [facility] 03/2023:   Prox RCA lesion is 50% stenosed.    RPAV lesion is 20% stenosed.   RPDA lesion is 20% stenosed.   Mid RCA lesion is 20% stenosed.   1.  Moderate right coronary artery disease with RFR of 0.98 and FFR of 0.93; intervention was therefore deferred. 2.  Assessment of coronary microvascular dysfunction was performed which demonstrated a CFR of 3.1 and an index of microvascular resistance of 6 indicating no evidence of coronary microvascular dysfunction. 3.  Highly elevated LVEDP of 32 mmHg.   Recommendation: Medical therapy; will discharge on Lasix 20 mg twice daily and check a BMP in 1 week to address elevated LVEDP.     Vascular ultrasound lower extremity arterial duplex right 12/2022:  Summary:  Right: 30-49% stenosis noted in the common femoral artery. 75-99% stenosis  noted in the superficial femoral artery, s/p atherectomy. Moderate  progression is noted compared to previous study.     See table(s) above for measurements and observations.  See ABI report.   Suggest Peripheral Vascular Consult.  ABI's 12/2022:  Summary:  Right: Resting right ankle-brachial index indicates moderate right lower  extremity arterial disease. The right toe-brachial index is abnormal.   Left: Resting left ankle-brachial index is within normal range. The left  toe-brachial index is abnormal.    *See table(s) above for measurements and observations.  See arterial duplex report.    Lexiscan 08/2021:    Findings are consistent with no prior ischemia. The study is intermediate risk.   No ST deviation was noted.   Left ventricular function is abnormal. Global function is mildly reduced. End diastolic cavity size is moderately enlarged. End systolic cavity size is mildly enlarged.  Prior study not available for comparison.   Findings: Occasional PVCs during pharmacological stress.  No evidence of ischemia or infarction. There is an apical perfusion defect that improves with stress consistent with artifact. There is basal, inferior septal  wall motion abnormality with increase in LV size and LVEF of 49 % (mildly reduced) but in the setting of subdiaphragmatic attenuation worse at rest.   Conclusions: Stress test is negative for ischemia and less suggestive of infarction Moderate risk study due to mildly decrease LVEF. Consider echocardiogram if clinically indicated.   Risk Assessment/Calculations:    STOP-Bang Score:  5  { Physical Exam:   VS:  BP 118/72   Pulse 81   Ht 6\' 6"  (1.981 m)   Wt 271 lb (122.9 kg)   SpO2 98%   BMI 31.32 kg/m    Wt Readings from Last 3 Encounters:  05/15/23 271 lb (122.9 kg)  04/14/23 270 lb (122.5 kg)  04/08/23 266 lb 12.8 oz (121 kg)    GEN: Well nourished, well developed in no acute distress NECK: No JVD; No carotid bruits CARDIAC: S1/S2, RRR, no murmurs, rubs, gallops RESPIRATORY:  Clear to auscultation without rales, wheezing or rhonchi  ABDOMEN: Soft, non-tender, non-distended EXTREMITIES:  No edema; No deformity   ASSESSMENT AND PLAN: .    CAD Recent left heart cath revealed moderate RCA disease-see RFR and FFR values noted above, no intervention was performed.  No evidence of coronary microvascular dysfunction was also seen..Recommended for medical therapy.  No complications post cath.  Patient reports improvement in his chest pressure.  Continue aspirin, atorvastatin, Plavix, Zetia, Zestoretic, Lopressor, and nitroglycerin as needed. Heart healthy diet and regular cardiovascular exercise encouraged. Care and ED precautions discussed.   PAD, s/p directional atherectomy and drug-coated balloon angioplasty in May 2023 Denies any symptoms.  Continue current medication regimen.  Recent vascular ultrasound studies noted above from July 2024.  Continue follow-up with Dr. Allyson Sabal as scheduled. Heart healthy diet and regular cardiovascular exercise encouraged.   Mixed HLD LDL 60 from May 2024.  He is currently at goal.  Continue current medication regimen. Heart healthy diet and regular  cardiovascular exercise encouraged.   HTN Blood pressure stable. Discussed to monitor BP at home at least 2 hours after medications and sitting for 5-10 minutes. Continue current medication regimen. Heart healthy diet and regular cardiovascular exercise encouraged.   DOE, medication management Still notes feeling short of breath/winded with exertion.  Will update echocardiogram at this time and obtain proBNP, BMET, and magnesium level for further evaluation.  Continue Lasix for elevated LVEDP noted during cardiac catheterization. Heart healthy diet and regular cardiovascular exercise encouraged.  Care and ED precautions discussed.  Fatigue, suspected sleep apnea STOP-BANG score is 5.  He shows some signs and symptoms of OSA.  Will refer to pulmonology for OSA evaluation.  7.  Sweating abnormality/excessive sweating Continues to note sweating abnormality with doing anything exertional.  Will obtain thyroid panel in addition to lab work as mentioned above.  Recommended to continue follow-up with PCP for further evaluation.  Continue current medication regimen.  Care and ED precautions discussed.  Dispo: Follow-up with me/APP in 6-8 weeks or sooner if anything changes.   Signed, Sharlene Dory, NP

## 2023-05-15 NOTE — Patient Instructions (Addendum)
Medication Instructions:  Your physician recommends that you continue on your current medications as directed. Please refer to the Current Medication list given to you today.  Labwork: In 1 week   Testing/Procedures: Your physician has requested that you have an echocardiogram. Echocardiography is a painless test that uses sound waves to create images of your heart. It provides your doctor with information about the size and shape of your heart and how well your heart's chambers and valves are working. This procedure takes approximately one hour. There are no restrictions for this procedure. Please do NOT wear cologne, perfume, aftershave, or lotions (deodorant is allowed). Please arrive 15 minutes prior to your appointment time.  Please note: We ask at that you not bring children with you during ultrasound (echo/ vascular) testing. Due to room size and safety concerns, children are not allowed in the ultrasound rooms during exams. Our front office staff cannot provide observation of children in our lobby area while testing is being conducted. An adult accompanying a patient to their appointment will only be allowed in the ultrasound room at the discretion of the ultrasound technician under special circumstances. We apologize for any inconvenience.  Follow-Up: Your physician recommends that you schedule a follow-up appointment in: 6-8 weeks peck   Any Other Special Instructions Will Be Listed Below (If Applicable).  If you need a refill on your cardiac medications before your next appointment, please call your pharmacy.

## 2023-05-20 ENCOUNTER — Other Ambulatory Visit (HOSPITAL_COMMUNITY)
Admission: RE | Admit: 2023-05-20 | Discharge: 2023-05-20 | Disposition: A | Discharge status: Home / Self Care | Payer: Medicare Other | Source: Ambulatory Visit | Attending: Internal Medicine | Admitting: Internal Medicine

## 2023-05-20 DIAGNOSIS — R61 Generalized hyperhidrosis: Secondary | ICD-10-CM | POA: Insufficient documentation

## 2023-05-20 DIAGNOSIS — R0602 Shortness of breath: Secondary | ICD-10-CM | POA: Insufficient documentation

## 2023-05-20 DIAGNOSIS — R0609 Other forms of dyspnea: Secondary | ICD-10-CM | POA: Diagnosis not present

## 2023-05-20 DIAGNOSIS — Z79899 Other long term (current) drug therapy: Secondary | ICD-10-CM | POA: Diagnosis present

## 2023-05-20 LAB — BASIC METABOLIC PANEL
Anion gap: 8 (ref 5–15)
BUN: 14 mg/dL (ref 6–20)
CO2: 25 mmol/L (ref 22–32)
Calcium: 8.9 mg/dL (ref 8.9–10.3)
Chloride: 99 mmol/L (ref 98–111)
Creatinine, Ser: 1.03 mg/dL (ref 0.61–1.24)
GFR, Estimated: >60 mL/min (ref >60)
Glucose, Bld: 136 mg/dL — ABNORMAL HIGH (ref 70–99)
Potassium: 3.7 mmol/L (ref 3.5–5.1)
Sodium: 132 mmol/L — ABNORMAL LOW (ref 135–145)

## 2023-05-20 LAB — BRAIN NATRIURETIC PEPTIDE: B Natriuretic Peptide: 19 pg/mL (ref 0.0–100.0)

## 2023-05-23 LAB — THYROID PANEL WITH TSH
Free Thyroxine Index: 1.3 (ref 1.2–4.9)
T3 Uptake Ratio: 25 % (ref 24–39)
T4, Total: 5.2 ug/dL (ref 4.5–12.0)
TSH: 1.55 u[IU]/mL (ref 0.450–4.500)

## 2023-06-02 ENCOUNTER — Ambulatory Visit: Payer: Medicare Other | Attending: Nurse Practitioner

## 2023-06-02 DIAGNOSIS — R0609 Other forms of dyspnea: Secondary | ICD-10-CM | POA: Insufficient documentation

## 2023-06-02 DIAGNOSIS — Z79899 Other long term (current) drug therapy: Secondary | ICD-10-CM | POA: Diagnosis present

## 2023-06-02 DIAGNOSIS — R61 Generalized hyperhidrosis: Secondary | ICD-10-CM | POA: Insufficient documentation

## 2023-06-02 MED ORDER — PERFLUTREN LIPID MICROSPHERE
1.0000 mL | INTRAVENOUS | Status: AC | PRN
  Administered 2023-06-02: 4 mL via INTRAVENOUS

## 2023-06-03 LAB — ECHOCARDIOGRAM COMPLETE
AR max vel: 2.85 cm2
AV Area VTI: 2.65 cm2
AV Area mean vel: 2.94 cm2
AV Mean grad: 5 mm[Hg]
AV Peak grad: 7.7 mm[Hg]
Ao pk vel: 1.39 m/s
Area-P 1/2: 2.33 cm2
Calc EF: 54.5 %
MV VTI: 3.41 cm2
S' Lateral: 3.1 cm
Single Plane A2C EF: 52.2 %
Single Plane A4C EF: 57.2 %

## 2023-06-07 ENCOUNTER — Other Ambulatory Visit: Payer: Self-pay | Admitting: Internal Medicine

## 2023-06-07 DIAGNOSIS — G629 Polyneuropathy, unspecified: Secondary | ICD-10-CM

## 2023-06-24 ENCOUNTER — Other Ambulatory Visit: Payer: Self-pay | Admitting: Internal Medicine

## 2023-06-26 ENCOUNTER — Ambulatory Visit: Payer: Medicare Other | Attending: Nurse Practitioner | Admitting: Nurse Practitioner

## 2023-06-26 ENCOUNTER — Encounter: Payer: Self-pay | Admitting: Nurse Practitioner

## 2023-06-26 VITALS — BP 114/62 | HR 88 | Ht 78.0 in | Wt 269.2 lb

## 2023-06-26 DIAGNOSIS — I2510 Atherosclerotic heart disease of native coronary artery without angina pectoris: Secondary | ICD-10-CM | POA: Insufficient documentation

## 2023-06-26 DIAGNOSIS — I739 Peripheral vascular disease, unspecified: Secondary | ICD-10-CM | POA: Insufficient documentation

## 2023-06-26 DIAGNOSIS — R5383 Other fatigue: Secondary | ICD-10-CM | POA: Diagnosis present

## 2023-06-26 DIAGNOSIS — E782 Mixed hyperlipidemia: Secondary | ICD-10-CM | POA: Diagnosis present

## 2023-06-26 DIAGNOSIS — R0609 Other forms of dyspnea: Secondary | ICD-10-CM | POA: Diagnosis present

## 2023-06-26 DIAGNOSIS — R61 Generalized hyperhidrosis: Secondary | ICD-10-CM | POA: Diagnosis present

## 2023-06-26 DIAGNOSIS — R29818 Other symptoms and signs involving the nervous system: Secondary | ICD-10-CM | POA: Insufficient documentation

## 2023-06-26 DIAGNOSIS — I10 Essential (primary) hypertension: Secondary | ICD-10-CM | POA: Diagnosis present

## 2023-06-26 NOTE — Patient Instructions (Addendum)

## 2023-06-26 NOTE — Progress Notes (Signed)
 Cardiology Office Note:  .   Date:  06/26/2023 ID:  Timothy Nielsen, DOB 03-04-1971, MRN [id number] PCP: Melvenia Manus BRAVO, MD   [facility] Providers Cardiologist:  Jayson Sierras, MD    History of Present Illness: .   Timothy Nielsen is a 53 y.o. male with PMH of CAD, chest pressure, PAD, s/p directional atherectomy and drug-coated balloon angioplasty in May 2023, hypertension, mixed hyperlipidemia, DOE, and fatigue, who presents today for follow-up.  Last seen by Dr. Sierras on April 08, 2023.  He noted stable claudication, was using a cane at the time.  Described intermittent exertional chest pressure, fatigue, diaphoresis within the last few weeks. Given his known multivessel coronary artery calcification by prior CT imaging, cardiac catheterization was arranged and revealed moderate right coronary artery disease with RFR of 0.98 and FFR of 0.93, intervention was deferred, also revealed assessment of coronary microvascular dysfunction that demonstrated a CFR of 3.1 and an index of microvascular resistance of 6 indicating no evidence of coronary microvascular dysfunction, medical therapy was recommended and was discharged on Lasix  20 mg twice daily and recommended to  check a BMP in 1 week to address his elevated LVEDP.   05/15/2023 - Today he presents for 1 month postcardiac catheterization follow-up.  He states his chest pain has improved from last office visit. Still notes feeling short of breath/winded with exertion. Says he has low energy. Says he has significant sweating when doing anything. Denies any  palpitations, syncope, presyncope, dizziness, orthopnea, PND, swelling or significant weight changes, acute bleeding, or claudication. Does have some phlegm and coughs in the AM after getting up and when lying down at night. Stop BANG score is 5. He is a former smoker.   06/26/2023 -he is doing the same since I last saw him.  Breathing is stable, denies any chest pain.  He is requesting  refills on his inhalers. Denies any chest pain, palpitations, syncope, presyncope, dizziness, orthopnea, PND, swelling or significant weight changes, acute bleeding, or claudication.  Denies any drug use or alcohol use.  ROS: Negative. See HPI.   Studies Reviewed: .    Echocardiogram 05/2023:  1. Left ventricular ejection fraction, by estimation, is 50 to 55%. The  left ventricle has low normal function. The left ventricle has no regional  wall motion abnormalities. There is mild left ventricular hypertrophy.  Left ventricular diastolic parameters are consistent with Grade I diastolic dysfunction (impaired relaxation).   2. Right ventricular systolic function is normal. The right ventricular  size is normal. Tricuspid regurgitation signal is inadequate for assessing  PA pressure.   3. The mitral valve is normal in structure. No evidence of mitral valve  regurgitation. No evidence of mitral stenosis.   4. The aortic valve is tricuspid. Aortic valve regurgitation is not  visualized. No aortic stenosis is present.   5. Aortic dilatation noted. 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   6. The inferior vena cava is normal in size with greater than 50%  respiratory variability, suggesting right atrial pressure of 3 mmHg.   Comparison(s): EF 60-65%. Ascending aorta sized mildly increased-37 mm.  LHC 03/2023:   Prox RCA lesion is 50% stenosed.   RPAV lesion is 20% stenosed.   RPDA lesion is 20% stenosed.   Mid RCA lesion is 20% stenosed.   1.  Moderate right coronary artery disease with RFR of 0.98 and FFR of 0.93; intervention was therefore deferred. 2.  Assessment of coronary microvascular dysfunction was  performed which demonstrated a CFR of 3.1 and an index of microvascular resistance of 6 indicating no evidence of coronary microvascular dysfunction. 3.  Highly elevated LVEDP of 32 mmHg.   Recommendation: Medical therapy; will discharge on Lasix  20 mg twice  daily and check a BMP in 1 week to address elevated LVEDP.     Vascular ultrasound lower extremity arterial duplex right 12/2022:  Summary:  Right: 30-49% stenosis noted in the common femoral artery. 75-99% stenosis  noted in the superficial femoral artery, s/p atherectomy. Moderate  progression is noted compared to previous study.     See table(s) above for measurements and observations.  See ABI report.   Suggest Peripheral Vascular Consult.  ABI's 12/2022:  Summary:  Right: Resting right ankle-brachial index indicates moderate right lower  extremity arterial disease. The right toe-brachial index is abnormal.   Left: Resting left ankle-brachial index is within normal range. The left  toe-brachial index is abnormal.    *See table(s) above for measurements and observations.  See arterial duplex report.    Lexiscan  08/2021:    Findings are consistent with no prior ischemia. The study is intermediate risk.   No ST deviation was noted.   Left ventricular function is abnormal. Global function is mildly reduced. End diastolic cavity size is moderately enlarged. End systolic cavity size is mildly enlarged.   Prior study not available for comparison.   Findings: Occasional PVCs during pharmacological stress.  No evidence of ischemia or infarction. There is an apical perfusion defect that improves with stress consistent with artifact. There is basal, inferior septal wall motion abnormality with increase in LV size and LVEF of 49 % (mildly reduced) but in the setting of subdiaphragmatic attenuation worse at rest.   Conclusions: Stress test is negative for ischemia and less suggestive of infarction Moderate risk study due to mildly decrease LVEF. Consider echocardiogram if clinically indicated.   Risk Assessment/Calculations:    STOP-Bang Score:  5  { Physical Exam:   VS:  BP 114/62   Pulse 88   Ht 6' 6 (1.981 m)   Wt 269 lb 3.2 oz (122.1 kg)   SpO2 94%   BMI 31.11 kg/m     Wt Readings from Last 3 Encounters:  06/26/23 269 lb 3.2 oz (122.1 kg)  05/15/23 271 lb (122.9 kg)  04/14/23 270 lb (122.5 kg)    GEN: Well nourished, well developed in no acute distress NECK: No JVD; No carotid bruits CARDIAC: S1/S2, RRR, no murmurs, rubs, gallops RESPIRATORY:  Clear to auscultation without rales, wheezing or rhonchi  ABDOMEN: Soft, non-tender, non-distended EXTREMITIES:  No edema; No deformity   ASSESSMENT AND PLAN: .    CAD Recent LHC revealed moderate RCA disease-see RFR and FFR values noted above, no intervention was performed.  No evidence of coronary microvascular dysfunction was also seen. Recommended for medical therapy.  Denies any chest pain.  Continue aspirin , atorvastatin , Plavix , Zetia , Zestoretic , Lopressor , and nitroglycerin  as needed. Heart healthy diet and regular cardiovascular exercise encouraged. Care and ED precautions discussed.   PAD, s/p directional atherectomy and drug-coated balloon angioplasty in May 2023 Denies any symptoms.  Continue current medication regimen. Vascular ultrasound studies noted above from July 2024.  Continue follow-up with Dr. Court as scheduled. Heart healthy diet and regular cardiovascular exercise encouraged.   Mixed HLD LDL 60 from May 2024.  He is currently at goal.  Continue current medication regimen. Heart healthy diet and regular cardiovascular exercise encouraged.   HTN Blood pressure stable. Discussed  to monitor BP at home at least 2 hours after medications and sitting for 5-10 minutes. Continue current medication regimen. Heart healthy diet and regular cardiovascular exercise encouraged.   DOE Still notes feeling short of breath/winded with exertion.  This has remained stable since last office visit.  Recent lab work was within normal limits and recent echocardiogram report noted above, benign.  Continue Lasix  for elevated LVEDP noted during cardiac catheterization. Heart healthy diet and regular cardiovascular  exercise encouraged.  Care and ED precautions discussed. Continue to follow with PCP.  Will route note to PCP regarding patient's request about inhaler refills.  Fatigue, suspected sleep apnea STOP-BANG score is 5.  He shows some signs and symptoms of OSA.  Previously referred to pulmonology for OSA evaluation.  7.  Sweating abnormality/excessive sweating Continues to note sweating abnormality with doing anything exertional.  Recent lab work WNL.  Recommended to continue follow-up with PCP for further evaluation.  Continue current medication regimen.  Care and ED precautions discussed.  Dispo: Follow-up with Dr. Debera or APP in 6 months or sooner if anything changes.   Signed, Almarie Crate, NP

## 2023-06-27 ENCOUNTER — Other Ambulatory Visit: Payer: Self-pay | Admitting: Internal Medicine

## 2023-06-27 DIAGNOSIS — J4540 Moderate persistent asthma, uncomplicated: Secondary | ICD-10-CM

## 2023-06-27 MED ORDER — BUDESONIDE-FORMOTEROL FUMARATE 160-4.5 MCG/ACT IN AERO
2.0000 | INHALATION_SPRAY | Freq: Two times a day (BID) | RESPIRATORY_TRACT | 3 refills | Status: DC
Start: 2023-06-27 — End: 2023-10-28

## 2023-06-27 MED ORDER — ALBUTEROL SULFATE HFA 108 (90 BASE) MCG/ACT IN AERS: INHALATION_SPRAY | RESPIRATORY_TRACT | 0 refills | Status: DC

## 2023-07-06 ENCOUNTER — Other Ambulatory Visit: Payer: Self-pay | Admitting: Internal Medicine

## 2023-07-06 DIAGNOSIS — G629 Polyneuropathy, unspecified: Secondary | ICD-10-CM

## 2023-07-10 ENCOUNTER — Other Ambulatory Visit: Payer: Self-pay

## 2023-07-10 DIAGNOSIS — E7800 Pure hypercholesterolemia, unspecified: Secondary | ICD-10-CM

## 2023-07-10 MED ORDER — EZETIMIBE 10 MG PO TABS: 10.0000 mg | ORAL_TABLET | Freq: Every day | ORAL | 3 refills | Status: DC

## 2023-07-17 ENCOUNTER — Encounter: Payer: Self-pay | Admitting: Internal Medicine

## 2023-07-17 ENCOUNTER — Ambulatory Visit (INDEPENDENT_AMBULATORY_CARE_PROVIDER_SITE_OTHER): Payer: Medicare Other | Admitting: Internal Medicine

## 2023-07-17 VITALS — BP 130/79 | HR 85 | Ht 78.0 in | Wt 278.2 lb

## 2023-07-17 DIAGNOSIS — I2584 Coronary atherosclerosis due to calcified coronary lesion: Secondary | ICD-10-CM

## 2023-07-17 DIAGNOSIS — I10 Essential (primary) hypertension: Secondary | ICD-10-CM | POA: Diagnosis not present

## 2023-07-17 DIAGNOSIS — M25562 Pain in left knee: Secondary | ICD-10-CM

## 2023-07-17 DIAGNOSIS — K219 Gastro-esophageal reflux disease without esophagitis: Secondary | ICD-10-CM

## 2023-07-17 DIAGNOSIS — E7800 Pure hypercholesterolemia, unspecified: Secondary | ICD-10-CM

## 2023-07-17 DIAGNOSIS — I2510 Atherosclerotic heart disease of native coronary artery without angina pectoris: Secondary | ICD-10-CM

## 2023-07-17 DIAGNOSIS — M1712 Unilateral primary osteoarthritis, left knee: Secondary | ICD-10-CM | POA: Diagnosis not present

## 2023-07-17 DIAGNOSIS — I739 Peripheral vascular disease, unspecified: Secondary | ICD-10-CM

## 2023-07-17 MED ORDER — LISINOPRIL-HYDROCHLOROTHIAZIDE 10-12.5 MG PO TABS: 1.0000 | ORAL_TABLET | Freq: Every day | ORAL | 3 refills | Status: DC

## 2023-07-17 MED ORDER — PANTOPRAZOLE SODIUM 40 MG PO TBEC: 40.0000 mg | DELAYED_RELEASE_TABLET | Freq: Every day | ORAL | 3 refills | Status: DC

## 2023-07-17 MED ORDER — CLOPIDOGREL BISULFATE 75 MG PO TABS: 75.0000 mg | ORAL_TABLET | Freq: Every day | ORAL | 3 refills | Status: DC

## 2023-07-17 MED ORDER — EZETIMIBE 10 MG PO TABS: 10.0000 mg | ORAL_TABLET | Freq: Every day | ORAL | 3 refills | Status: AC

## 2023-07-17 NOTE — Progress Notes (Signed)
 Established Patient Office Visit  Subjective   Patient ID: Timothy Nielsen, male    DOB: [birth year]  Age: 53 y.o. MRN: [id number]  Chief Complaint  Patient presents with   Care Management    Three month follow up    Hypotension    Blood pressure has been dropping low to the extent of 80/40   Mass    Little bumps on back and arms   Timothy Nielsen returns to care today for routine follow-up.  He was last evaluated by me in October 2024.  At that time he endorsed symptoms of dyspnea, dull chest discomfort, and dyspnea that occurred with exertion.  He was seen by cardiology for follow-up and subsequently underwent LHC on 10/21 which showed multivessel CAD.  Medical therapy recommended.  Discharged on Lasix due to elevated LVEDP.  Normal EF on TTE from December.  Most recently seen by cardiology for follow-up on 1/2. Timothy Nielsen reports feeling fairly well today.  He endorses persistent left knee pain and would like to see orthopedic surgery.  He also states that his blood pressure has been dropping to 80s/40s and causes him to feel dizzy.  He has been taking lisinopril-HCTZ as prescribed.  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   Past Surgical History:  Procedure Laterality Date   ABDOMINAL AORTOGRAM W/LOWER EXTREMITY Bilateral 10/29/2021   Procedure: ABDOMINAL AORTOGRAM W/LOWER EXTREMITY;  Surgeon: Runell Gess, MD;  Location: [facility];  Service: Cardiovascular;  Laterality: Bilateral;   APPENDECTOMY     CATARACT EXTRACTION Left 07/26/2022   CORONARY PRESSURE/FFR STUDY N/A 04/14/2023   Procedure: CORONARY PRESSURE/FFR STUDY;  Surgeon: Orbie Pyo, MD;  Location: [facility];  Service: Cardiovascular;  Laterality: N/A;   FRACTURE SURGERY Right    Right knee- age 50    LEFT HEART CATH AND CORONARY ANGIOGRAPHY N/A 04/14/2023   Procedure: LEFT HEART CATH AND CORONARY ANGIOGRAPHY;  Surgeon: Orbie Pyo, MD;  Location: [facility];  Service: Cardiovascular;  Laterality: N/A;   PERIPHERAL VASCULAR ATHERECTOMY  10/29/2021   Procedure: PERIPHERAL VASCULAR ATHERECTOMY;  Surgeon: Runell Gess, MD;  Location: [facility] IN[facility];  Service: Cardiovascular;;  Rt. SFA   Social History   Tobacco Use   Smoking status: Former    Current packs/day: 0.00    Average packs/day: 1 pack/day for 37.0 years (37.0 ttl pk-yrs)    Types: Cigarettes    Start date: 11/14/1986    Quit date: 07/22/2022    Years since quitting: 1.0   Smokeless tobacco: Never  Vaping Use   Vaping status: Never Used  Substance Use Topics   Alcohol use: Yes    Alcohol/week: 18.0 standard drinks of alcohol    Types: 18 Cans of beer per week    Comment: 12 pack a week   Drug use: No   Family History  Problem Relation Age of Onset   Diabetes Mother    Hypertension Mother    Alzheimer's disease Mother    Diabetes Father    Emphysema Father    No Known Allergies  Review of Systems  Musculoskeletal:  Positive for joint pain (Left knee).  Neurological:  Positive for dizziness (Orthostasis).     Objective:  BP 130/79 (BP Location: Right Arm, Patient Position: Sitting, Cuff Size: Large)   Pulse 85   Ht 6\' 6"  (1.981 m)   Wt 278 lb 3.2 oz (126.2 kg)   SpO2 97%   BMI 32.15 kg/m  BP Readings from Last 3 Encounters:  07/28/23 138/80  07/23/23 126/86  07/17/23 130/79   Physical Exam Vitals reviewed.  Constitutional:      General: He is not in acute distress.    Appearance: Normal appearance. He is not ill-appearing.  HENT:     Head: Normocephalic and atraumatic.     Right Ear: External ear normal.     Left Ear: External ear normal.     Nose: Nose normal. No congestion or rhinorrhea.     Mouth/Throat:     Mouth: Mucous membranes are moist.     Pharynx: Oropharynx  is clear.  Eyes:     General: No scleral icterus.    Extraocular Movements: Extraocular movements intact.     Conjunctiva/sclera: Conjunctivae normal.     Pupils: Pupils are equal, round, and reactive to light.  Cardiovascular:     Rate and Rhythm: Normal rate and regular rhythm.     Pulses: Normal pulses.     Heart sounds: Normal heart sounds. No murmur heard. Pulmonary:     Effort: Pulmonary effort is normal.     Breath sounds: Normal breath sounds. No wheezing, rhonchi or rales.  Abdominal:     General: Abdomen is flat. Bowel sounds are normal. There is no distension.     Palpations: Abdomen is soft.     Tenderness: There is no abdominal tenderness.  Musculoskeletal:        General: No swelling or deformity. Normal range of motion.     Cervical back: Normal range of motion.  Skin:    General: Skin is warm and dry.     Capillary Refill: Capillary refill takes less than 2 seconds.  Neurological:     General: No focal deficit present.     Mental Status: He is alert and oriented to person, place, and time.     Motor: No weakness.     Gait: Gait abnormal (Ambulates with cane).  Psychiatric:        Mood and Affect: Mood normal.        Behavior: Behavior normal.        Thought Content: Thought content normal.   Last CBC Lab Results  Component Value Date   WBC 7.9 04/08/2023   HGB 15.7 04/08/2023   HCT 47.2 04/08/2023   MCV 93 04/08/2023   MCH 30.9 04/08/2023   RDW 12.3 04/08/2023   PLT 260 04/08/2023   Last metabolic panel Lab Results  Component Value Date   GLUCOSE 136 (H) 05/20/2023   NA 132 (L) 05/20/2023   K 3.7 05/20/2023   CL 99 05/20/2023   CO2 25 05/20/2023   BUN 14 05/20/2023   CREATININE 1.03 05/20/2023   GFRNONAA >60 05/20/2023   CALCIUM 8.9 05/20/2023   PROT 7.2 02/15/2022   ALBUMIN 4.2 02/15/2022   LABGLOB 2.5 08/18/2020   AGRATIO 1.9 08/18/2020   BILITOT 1.0 02/15/2022   ALKPHOS 105 02/15/2022   AST 29 02/15/2022   ALT 53 (H) 02/15/2022    ANIONGAP 8 05/20/2023   Last lipids Lab Results  Component Value Date   CHOL 123 11/13/2022   HDL 41 11/13/2022   LDLCALC 60 11/13/2022   TRIG 123 11/13/2022   CHOLHDL 3.0 11/13/2022   Last  hemoglobin A1c Lab Results  Component Value Date   HGBA1C 5.9 (H) 07/02/2022   Last thyroid functions Lab Results  Component Value Date   TSH 1.550 05/20/2023   T4TOTAL 5.2 05/20/2023   Last vitamin D Lab Results  Component Value Date   VD25OH 45.5 11/13/2022   Last vitamin B12 and Folate Lab Results  Component Value Date   VITAMINB12 791 07/02/2022   FOLATE 9.4 07/02/2022     Assessment & Plan:   Problem List Items Addressed This Visit       Essential hypertension   Currently prescribed lisinopril-HCTZ 20-12.5 mg daily and metoprolol tartrate 25 mg twice daily.  He describes orthostasis and reports hypotensive BP readings (80s/40s).  -Reduce lisinopril-HCTZ to 10-12.5 mg daily      Coronary artery calcification of native artery   Underwent LHC in October 2024, which revealed multivessel CAD.  Medical therapy recommended.  He has been out of Plavix x 1 week.  Refill provided today.      GERD (gastroesophageal reflux disease)   Symptoms remain well-controlled with pantoprazole.  Refill provided today.      Osteoarthritis of left knee   Known history of tricompartmental osteoarthritis of the left knee.  Orthopedic surgery referral placed today at his request.  We also discussed physical therapy but he has declined for now.      Return in about 3 months (around 10/15/2023).   Billie Lade, MD

## 2023-07-17 NOTE — Patient Instructions (Signed)
It was a pleasure to see you today.  Thank you for giving [place] the opportunity to be involved in your care.  Below is a brief recap of your visit and next steps.  We will plan to see you again in 3 months.   Summary Reduce lisinopril-hydrochlorothiazide to 10-12.5 mg daily Refills provided Orthopedic surgery referral placed Follow up in 3 months

## 2023-07-23 ENCOUNTER — Ambulatory Visit: Payer: Medicare Other | Admitting: Orthopedic Surgery

## 2023-07-23 ENCOUNTER — Encounter: Payer: Self-pay | Admitting: Orthopedic Surgery

## 2023-07-23 ENCOUNTER — Other Ambulatory Visit (INDEPENDENT_AMBULATORY_CARE_PROVIDER_SITE_OTHER): Payer: Self-pay

## 2023-07-23 VITALS — BP 126/86 | HR 89 | Ht 78.0 in | Wt 278.0 lb

## 2023-07-23 DIAGNOSIS — G8929 Other chronic pain: Secondary | ICD-10-CM | POA: Diagnosis not present

## 2023-07-23 DIAGNOSIS — M25562 Pain in left knee: Secondary | ICD-10-CM

## 2023-07-23 MED ORDER — METHYLPREDNISOLONE ACETATE 40 MG/ML IJ SUSP
40.0000 mg | Freq: Once | INTRAMUSCULAR | Status: AC
  Administered 2023-07-23: 40 mg via INTRA_ARTICULAR

## 2023-07-23 NOTE — Patient Instructions (Signed)

## 2023-07-23 NOTE — Progress Notes (Signed)
New Patient Visit  Assessment: Timothy Nielsen is a 53 y.o. male with the following: 1. Chronic pain of left knee  Plan: Timothy Nielsen has chronic pain in the left knee.  No specific injury.  He has used a cane for years secondary to a childhood illness.  On exam, he has mild swelling.  Mild tenderness palpation along the medial joint line.  There is crepitus with range of motion testing.  Radiographs demonstrates some mild loss of joint space, with some signs of arthritis within the patellofemoral joint.  It is possible that the patellofemoral joint irritation is causing his pain.  I recommended a steroid injection.  This was completed in clinic today.  He will return as needed.   Procedure note injection Left knee joint   Verbal consent was obtained to inject the left knee joint  Timeout was completed to confirm the site of injection.  The skin was prepped with alcohol and ethyl chloride was sprayed at the injection site.  A 21-gauge needle was used to inject 40 mg of Depo-Medrol and 1% lidocaine (4 cc) into the left knee using an anterolateral approach.  There were no complications. A sterile bandage was applied.    Follow-up: Return if symptoms worsen or fail to improve.  Subjective:  Chief Complaint  Patient presents with   Knee Pain    Left/ pain for 2 years feels like catch while walking, makes him fall no injury gave out fell down steps     History of Present Illness: Timothy Nielsen is a 53 y.o. male who has been referred by Delmar Landau, MD for evaluation of left knee pain.  He states has had pain in the left knee intermittently for the past 2 years.  Occasionally, he will have catching sensation in the knee.  He does use a cane to assist with ambulation.  He states this is secondary to a childhood illness.  He was taking some naproxen, but was recently told not to use this medication secondary to anticoagulation.  No therapy.  He has not had an injection.  He notes occasional swelling  in the knee, which usually resolves within 1-2 days.   Review of Systems: No fevers or chills No numbness or tingling No chest pain No shortness of breath No bowel or bladder dysfunction No GI distress No headaches   Medical History:  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    Past Surgical History:  Procedure Laterality Date   ABDOMINAL AORTOGRAM W/LOWER EXTREMITY Bilateral 10/29/2021   Procedure: ABDOMINAL AORTOGRAM W/LOWER EXTREMITY;  Surgeon: Runell Gess, MD;  Location: [facility];  Service: Cardiovascular;  Laterality: Bilateral;   APPENDECTOMY     CATARACT EXTRACTION Left 07/26/2022   CORONARY PRESSURE/FFR STUDY N/A 04/14/2023   Procedure: CORONARY PRESSURE/FFR STUDY;  Surgeon: Orbie Pyo, MD;  Location: [facility];  Service: Cardiovascular;  Laterality: N/A;   FRACTURE SURGERY Right    Right knee- age 30   LEFT HEART CATH AND CORONARY ANGIOGRAPHY N/A 04/14/2023   Procedure: LEFT HEART CATH AND CORONARY ANGIOGRAPHY;  Surgeon: Orbie Pyo, MD;  Location: [facility];  Service: Cardiovascular;  Laterality: N/A;   PERIPHERAL VASCULAR ATHERECTOMY  10/29/2021   Procedure: PERIPHERAL VASCULAR ATHERECTOMY;  Surgeon: Runell Gess, MD;  Location: [facility] INVASIVE CV LAB;  Service: Cardiovascular;;  Rt. SFA    Family History  Problem Relation Age of Onset   Diabetes Mother    Hypertension Mother    Alzheimer's disease Mother    Diabetes Father    Social History   Tobacco Use   Smoking status: Former    Current packs/day: 0.00    Average packs/day: 1 pack/day for 37.0 years (37.0 ttl pk-yrs)    Types: Cigarettes    Start date: 11/14/1986    Quit date: 07/22/2022    Years since quitting: 1.0   Smokeless tobacco: Never  Vaping Use    Vaping status: Never Used  Substance Use Topics   Alcohol use: Yes    Alcohol/week: 18.0 standard drinks of alcohol    Types: 18 Cans of beer per week    Comment: 12 pack a week   Drug use: No    No Known Allergies  Current Meds  Medication Sig   albuterol (VENTOLIN HFA) 108 (90 Base) MCG/ACT inhaler INHALE 2 PUFFS BY MOUTH EVERY 6 HOURS AS NEEDED FOR COUGHING, WHEEZING, OR SHORTNESS OF BREATH   aspirin EC 81 MG tablet Take 81 mg by mouth daily. Swallow whole.   atorvastatin (LIPITOR) 80 MG tablet Take 1 tablet (80 mg total) by mouth daily.   budesonide-formoterol (SYMBICORT) 160-4.5 MCG/ACT inhaler Inhale 2 puffs into the lungs 2 (two) times daily.   Cholecalciferol (VITAMIN D3) 25 MCG (1000 UT) CAPS Take 1 capsule (1,000 Units total) by mouth daily.   clopidogrel (PLAVIX) 75 MG tablet Take 1 tablet (75 mg total) by mouth daily.   Cyanocobalamin 1500 MCG TBDP Take 1,500 mcg by mouth daily.   ezetimibe (ZETIA) 10 MG tablet Take 1 tablet (10 mg total) by mouth daily.   furosemide (LASIX) 20 MG tablet Take 1 tablet (20 mg total) by mouth 2 (two) times daily.   gabapentin (NEURONTIN) 300 MG capsule Take 1 capsule by mouth twice daily   lisinopril-hydrochlorothiazide (ZESTORETIC) 10-12.5 MG tablet Take 1 tablet by mouth daily.   metoprolol tartrate (LOPRESSOR) 25 MG tablet Take 1 tablet by mouth twice daily   nitroGLYCERIN (NITROSTAT) 0.4 MG SL tablet Place 1 tablet (0.4 mg total) under the tongue every 5 (five) minutes as needed for chest pain.   pantoprazole (PROTONIX) 40 MG tablet Take 1 tablet (40 mg total) by mouth daily.   sertraline (ZOLOFT) 50 MG tablet Take 1 tablet (50 mg total) by mouth daily.    Objective: BP 126/86   Pulse 89   Ht 6\' 6"  (1.981 m)   Wt 278 lb (126.1 kg)   BMI 32.13 kg/m   Physical Exam:  General: Alert and oriented. and No acute distress. Gait: Left sided antalgic gait. and Ambulates with the assistance of a cane.  Evaluation left knee  demonstrates no swelling.  He has full range of motion.  Crepitus with range of motion testing.  He can achieve full extension.  He tolerates flexion beyond 100 degrees.  Mild tenderness palpation along the medial joint line.  Mild pain with patellar grind.  Negative Lachman.    IMAGING: I personally ordered and reviewed the following images  X-rays of the left knee were obtained in clinic today.  No acute injuries are noted.  Neutral overall alignment.  Mild loss of joint space within the medial compartment.  Within the patellofemoral compartment, there appears to be some loss of joint space.  There are small osteophytes  on both the medial and lateral facet of the patella.  Impression: Left knee x-rays with mild degenerative changes, most advanced within the patellofemoral joint.   New Medications:  No orders of the defined types were placed in this encounter.     Oliver Barre, MD  07/23/2023 11:31 AM

## 2023-07-28 ENCOUNTER — Encounter (HOSPITAL_BASED_OUTPATIENT_CLINIC_OR_DEPARTMENT_OTHER): Payer: Self-pay | Admitting: Pulmonary Disease

## 2023-07-28 ENCOUNTER — Ambulatory Visit (HOSPITAL_BASED_OUTPATIENT_CLINIC_OR_DEPARTMENT_OTHER): Payer: Medicare Other | Admitting: Pulmonary Disease

## 2023-07-28 VITALS — BP 138/80 | HR 86 | Resp 18 | Ht 78.0 in | Wt 268.1 lb

## 2023-07-28 DIAGNOSIS — G4733 Obstructive sleep apnea (adult) (pediatric): Secondary | ICD-10-CM | POA: Diagnosis not present

## 2023-07-28 DIAGNOSIS — J4489 Other specified chronic obstructive pulmonary disease: Secondary | ICD-10-CM

## 2023-07-28 NOTE — Patient Instructions (Addendum)
You have mild COPD, continue on Symbicort. Continue annual CT scan to screen for lung cancer , next due would be March  x schedule home sleep test for sleep apnea Call us if you do not hear from [place] in 2 weeks

## 2023-07-28 NOTE — Progress Notes (Signed)
Subjective:    Patient ID: Timothy Nielsen, male    DOB: 1971/04/13, 53 y.o.   MRN: [id number]  HPI  PMH : CAD s/p angioplasty 10/2021  He smoked 37 pack years before he quit 06/2022   Discussed the use of AI scribe software for clinical note transcription with the patient, who gave verbal consent to proceed.  History of Present Illness   Timothy Nielsen, a patient with a history of asthma and circulatory problems in his legs, presents with symptoms suggestive of sleep apnea. He reports loud snoring, choking, and episodes of stopping breathing during sleep. He also experiences restless sleep and excessive daytime sleepiness, often falling asleep while watching TV or after meals. He notes that these symptoms have led to a decrease in his energy levels and overall quality of life.  In addition to his sleep issues, Timothy Nielsen also reports ongoing circulatory problems in his legs. He has had an artery cleaned out due to a 100% blockage and was recently informed that it is now 80% blocked. He is considering further intervention for this issue.  Timothy Nielsen also has a history of asthma, which was initially misdiagnosed as COPD. He reports shortness of breath with minimal activity, such as taking a shower, and uses two inhalers for symptom management. He also reports a significant smoking history, although he has quit smoking with the help of Chantix.  Timothy Nielsen has gained about thirty pounds in the last two years since going on disability due to his leg problems. He also reports poor dental health and is considering having all his teeth removed and getting dentures.   Peripheral Artery Disease (PAD) with previous arterial blockage and surgical intervention. Reports ongoing circulation issues and potential need for further procedures. On Plavix. Discussed potential need for further surgical intervention if symptoms worsen.       Epworth sleepiness score is 13. He takes daily naps 1 to 2 hours and still feels tired. He wakes  up tired fatigued with dryness of mouth and a sore throat due to loud snoring. Bedtime is around 9 PM but sleep latency can be up to 3 hours, TV is on a timer, he sleeps on his side with 1 pillow reports 3 nocturnal awakenings and is out of bed by 8 AM.  He has gained 30 pounds in the last 3 years.  He reports dyspnea on minimal exertion including taking a shower.  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    Past Surgical History:  Procedure Laterality Date   ABDOMINAL AORTOGRAM W/LOWER EXTREMITY Bilateral 10/29/2021   Procedure: ABDOMINAL AORTOGRAM W/LOWER EXTREMITY;  Surgeon: Runell Gess, MD;  Location: [facility];  Service: Cardiovascular;  Laterality: Bilateral;   APPENDECTOMY     CATARACT EXTRACTION Left 07/26/2022   CORONARY PRESSURE/FFR STUDY N/A 04/14/2023   Procedure: CORONARY PRESSURE/FFR STUDY;  Surgeon: Orbie Pyo, MD;  Location: [facility];  Service: Cardiovascular;  Laterality: N/A;   FRACTURE SURGERY Right    Right  knee- age 33   LEFT HEART CATH AND CORONARY ANGIOGRAPHY N/A 04/14/2023   Procedure: LEFT HEART CATH AND CORONARY ANGIOGRAPHY;  Surgeon: Orbie Pyo, MD;  Location: [facility];  Service: Cardiovascular;  Laterality: N/A;   PERIPHERAL VASCULAR ATHERECTOMY  10/29/2021   Procedure: PERIPHERAL VASCULAR ATHERECTOMY;  Surgeon: Runell Gess, MD;  Location: [facility] IN[facility]AS[facility] CV LAB;  Service: Cardiovascular;;  Rt. SFA   No Known Allergies Social History    Socioeconomic History   Marital status: Single    Spouse name: Not on file   Number of children: 0   Years of education: Not on file   Highest education level: Not on file  Occupational History   Not on file  Tobacco Use   Smoking status: Former    Current packs/day: 0.00    Average packs/day: 1 pack/day for 37.0 years (37.0 ttl pk-yrs)    Types: Cigarettes    Start date: 11/14/1986    Quit date: 07/22/2022    Years since quitting: 1.0   Smokeless tobacco: Never  Vaping Use   Vaping status: Never Used  Substance and Sexual Activity   Alcohol use: Yes    Alcohol/week: 18.0 standard drinks of alcohol    Types: 18 Cans of beer per week    Comment: 12 pack a week   Drug use: No   Sexual activity: Not on file  Other Topics Concern   Not on file  Social History Narrative   Right Handed    Lives in a one story home    Social Drivers of Health   Financial Resource Strain: Medium Risk (03/21/2022)   Overall Financial Resource Strain (CARDIA)    Difficulty of Paying Living Expenses: Somewhat hard  Food Insecurity: No Food Insecurity (03/21/2022)   Hunger Vital Sign    Worried About Running Out of Food in the Last Year: Never true    Ran Out of Food in the Last Year: Never true  Transportation Needs: No Transportation Needs (03/21/2022)   PRAPARE - Administrator, Civil Service (Medical): No    Lack of Transportation (Non-Medical): No  Physical Activity: Not on file  Stress: Not on file  Social Connections: Not on file  Intimate Partner Violence: Not on file    Family History  Problem Relation Age of Onset   Diabetes Mother    Hypertension Mother    Alzheimer's disease Mother    Diabetes Father    Emphysema Father      Review of Systems Constitutional: negative for anorexia, fevers and sweats  Eyes: negative for irritation, redness and visual disturbance  Ears, nose, mouth, throat, and face: negative for earaches, epistaxis, nasal congestion and sore  throat  Respiratory: negative for cough, sputum and wheezing  Cardiovascular: negative for chest pain,  lower extremity edema, orthopnea, palpitations and syncope  Gastrointestinal: negative for abdominal pain, constipation, diarrhea, melena, nausea and vomiting  Genitourinary:negative for dysuria, frequency and hematuria  Hematologic/lymphatic: negative for bleeding, easy bruising and lymphadenopathy  Musculoskeletal:negative for arthralgias, muscle weakness and stiff joints  Neurological: negative for coordination problems, gait problems, headaches and weakness  Endocrine: negative for diabetic symptoms including polydipsia, polyuria and weight loss     Objective:   Physical Exam  Gen. Pleasant, obese, in no distress, normal affect ENT - no pallor,icterus, no post nasal drip, class 2-3 airway Neck: No JVD, no thyromegaly, no carotid bruits Lungs: no use of accessory muscles, no dullness to percussion, decreased without rales or  rhonchi  Cardiovascular: Rhythm regular, heart sounds  normal, no murmurs or gallops, no peripheral edema Abdomen: soft and non-tender, no hepatosplenomegaly, BS normal. Musculoskeletal: No deformities, no cyanosis or clubbing Neuro:  alert, non focal, no tremors       Assessment & Plan:     Assessment and Plan    Obstructive Sleep Apnea (OSA) Presents with symptoms of OSA, including loud snoring, choking, gasping during sleep, daytime sleepiness, and difficulty staying awake during sedentary activities. Reports weight gain and feeling unrefreshed after sleep. Anatomical assessment indicates a narrow airway. High Epworth Sleepiness Scale score of 13. Discussed pathophysiology of OSA, including airway collapse during sleep leading to intermittent hypoxia and systemic effects. Explained risks of untreated OSA, including hypertension and cardiovascular stress. Discussed home sleep study as a diagnostic tool, with an in-lab study as an alternative if not  approved by Medicaid. Discussed potential CPAP therapy and benefits of weight loss if OSA is mild. Family history of OSA with significant improvement on CPAP therapy. - Order home sleep study - Arrange in-lab sleep study if home study not approved by Medicaid - Discuss potential CPAP therapy if OSA is confirmed - Advise weight loss if OSA is mild  COPD with Asthma PFTs show mild reversible obstruction Symptoms include dyspnea with minimal exertion, wheezing, and inadequate relief from Symbicort. History of smoking. Discussed proper inhaler technique and adherence. Consideration of alternative or additional asthma management strategies if current treatment is ineffective. - Reviewed lung function tests & LDCT chest, repeat in 3.2025 - Ensure proper inhaler technique and adherence - Consider Trelegy if current treatment is ineffective   General Health Maintenance Successfully quit smoking with Chantix. No current alcohol abuse. On multiple medications for cardiovascular issues, including antihypertensives and diuretics. Discussed importance of continued smoking cessation and healthy lifestyle choices. - Encourage continued smoking cessation - Monitor blood pressure and adjust medications as needed - Advise on healthy lifestyle choices  Follow-up - Review sleep study results one week after completion - Contact patient if no communication within specified time frames.

## 2023-07-28 NOTE — Progress Notes (Signed)
Epworth Sleepiness Scale  Use the following scale to choose the most appropriate number for each situation. 0 Would never nod off 1  Slight  chance of nodding off 2 Moderate chance of nodding off 3 High chance of nodding off  Sitting and reading: 2 Watching TV: 3 Sitting, inactive, in a public place (e.g., in a meeting, theater, or dinner event): 1 As a passenger in a car for an hour or more without stopping for a break: 1 Lying down to rest when circumstances permit:3 Sitting and talking to someone: 0 Sitting quietly after a meal without alcohol: 3 In a car, while stopped for a few  minutes in traffic or at a light: 0  TOTOAL: 13

## 2023-08-02 ENCOUNTER — Other Ambulatory Visit: Payer: Self-pay | Admitting: Internal Medicine

## 2023-08-02 DIAGNOSIS — K219 Gastro-esophageal reflux disease without esophagitis: Secondary | ICD-10-CM

## 2023-08-03 ENCOUNTER — Other Ambulatory Visit: Payer: Self-pay | Admitting: Internal Medicine

## 2023-08-03 DIAGNOSIS — G629 Polyneuropathy, unspecified: Secondary | ICD-10-CM

## 2023-08-06 DIAGNOSIS — G4733 Obstructive sleep apnea (adult) (pediatric): Secondary | ICD-10-CM

## 2023-08-16 ENCOUNTER — Telehealth: Payer: Self-pay | Admitting: Pulmonary Disease

## 2023-08-16 DIAGNOSIS — G4733 Obstructive sleep apnea (adult) (pediatric): Secondary | ICD-10-CM

## 2023-08-16 DIAGNOSIS — R0683 Snoring: Secondary | ICD-10-CM | POA: Diagnosis not present

## 2023-08-16 NOTE — Telephone Encounter (Signed)
 HST showed severe OSA with AHI 37/ hr with low sat of 81% Suggest autoCPAP  5-15 cm, mask of choice OV with me/APP in 6 wks

## 2023-08-18 NOTE — Telephone Encounter (Signed)
Pt notified and referral sent

## 2023-08-20 ENCOUNTER — Other Ambulatory Visit: Payer: Self-pay | Admitting: Internal Medicine

## 2023-08-20 DIAGNOSIS — E559 Vitamin D deficiency, unspecified: Secondary | ICD-10-CM

## 2023-08-25 ENCOUNTER — Encounter: Payer: Self-pay | Admitting: Internal Medicine

## 2023-08-25 DIAGNOSIS — M1712 Unilateral primary osteoarthritis, left knee: Secondary | ICD-10-CM | POA: Insufficient documentation

## 2023-08-25 NOTE — Assessment & Plan Note (Signed)
 Symptoms remain well-controlled with pantoprazole.  Refill provided today.

## 2023-08-25 NOTE — Assessment & Plan Note (Signed)
 Known history of tricompartmental osteoarthritis of the left knee.  Orthopedic surgery referral placed today at his request.  We also discussed physical therapy but he has declined for now.

## 2023-08-25 NOTE — Assessment & Plan Note (Signed)
 Currently prescribed lisinopril-HCTZ 20-12.5 mg daily and metoprolol tartrate 25 mg twice daily.  He describes orthostasis and reports hypotensive BP readings (80s/40s).  -Reduce lisinopril-HCTZ to 10-12.5 mg daily

## 2023-08-25 NOTE — Assessment & Plan Note (Signed)
 Underwent LHC in October 2024, which revealed multivessel CAD.  Medical therapy recommended.  He has been out of Plavix x 1 week.  Refill provided today.

## 2023-09-09 ENCOUNTER — Telehealth: Payer: Self-pay | Admitting: Cardiovascular Disease

## 2023-09-09 NOTE — Telephone Encounter (Signed)
 Spoke with pt, follow up appointment scheduled for Monday 09/15/23. He will have dopplers Thursday this week.

## 2023-09-09 NOTE — Telephone Encounter (Signed)
 Patient would like to inform Dr. Allyson Sabal that over the past month leg pain has gotten worse. Pain is mainly in right leg, feet, and calves. Legs are numb. Patient would like to discuss blockages.

## 2023-09-11 ENCOUNTER — Ambulatory Visit (HOSPITAL_BASED_OUTPATIENT_CLINIC_OR_DEPARTMENT_OTHER)
Admission: RE | Admit: 2023-09-11 | Discharge: 2023-09-11 | Disposition: A | Discharge status: Home / Self Care | Source: Ambulatory Visit | Attending: Cardiovascular Disease | Admitting: Cardiovascular Disease

## 2023-09-11 ENCOUNTER — Ambulatory Visit (HOSPITAL_COMMUNITY)
Admission: RE | Admit: 2023-09-11 | Discharge: 2023-09-11 | Disposition: A | Discharge status: Home / Self Care | Source: Ambulatory Visit | Attending: Cardiovascular Disease | Admitting: Cardiovascular Disease

## 2023-09-11 DIAGNOSIS — I739 Peripheral vascular disease, unspecified: Secondary | ICD-10-CM

## 2023-09-11 LAB — VAS US ABI WITH/WO TBI
Left ABI: 0.93
Right ABI: 0.66

## 2023-09-15 ENCOUNTER — Ambulatory Visit: Attending: Cardiovascular Disease | Admitting: Cardiovascular Disease

## 2023-09-15 ENCOUNTER — Encounter: Payer: Self-pay | Admitting: Cardiovascular Disease

## 2023-09-15 VITALS — BP 116/76 | HR 83 | Ht 78.0 in | Wt 265.1 lb

## 2023-09-15 DIAGNOSIS — I739 Peripheral vascular disease, unspecified: Secondary | ICD-10-CM | POA: Diagnosis not present

## 2023-09-15 DIAGNOSIS — I10 Essential (primary) hypertension: Secondary | ICD-10-CM | POA: Diagnosis present

## 2023-09-15 NOTE — H&P (View-Only) (Signed)
 09/15/2023 Timothy Nielsen   [birth year]  [id number]  Primary Physician Billie Lade, MD Primary Cardiologist: Runell Gess MD Nicholes Calamity, [place]  HPI:  Timothy Nielsen is a 53 y.o.    mildly overweight divorced Caucasian male with no children who is currently unemployed but previously worked for a tree company.  He was referred through the courtesy of Dr. Diona Browner, his cardiologist, for evaluation of lifestyle-limiting claudication.  I last saw him in the office 01/22/2023.  His risk factors include 35 pack years of tobacco abuse currently smoking 1/2 pack/day with the intent to quit.  He has treated hypertension and hyperlipidemia.  Both his parents had coronary intervention.  He is never had a heart attack or stroke.  He does complain of shortness of breath and occasional chest tightness.  He has COPD.  Recent had a 2D echo that was normal and a Myoview stress test that was nonischemic.  He has had lifestyle-limiting claudication for at least a year right greater than left.  He had Doppler studies performed at [facility] 07/26/2021 revealing right ABI of 0.69 and a left of 1.16.  I performed lower extremity arterial Doppler studies on him 10/04/2021 revealing a right ABI of 0.65 with an occluded right SFA.  I ultimately performed peripheral angiography and endovascular therapy on him 10/29/2021.  He had a right SFA CTO and had directional atherectomy followed by drug-coated balloon angioplasty.  He did have a small hematoma in his left groin which is resolving.  His Doppler studies performed 11/15/2021 revealed a widely patent right SFA.  His ABI increased from 0.65-0.80.  His claudication has significantly improved.  He did stop smoking briefly but has gone back to smoking 4 to 6 cigarettes a day.   Since I saw him in the office in July last year he has stopped smoking.  He had a heart catheterization by Dr.Thukkani on 04/14/2023 revealing minimal CAD.  He has had progressive right calf  claudication over the last several months with a decline in his right ABI to 0.66 with a high-frequency signal in his mid right SFA and wishes to proceed with outpatient peripheral angiography and endovascular therapy.   Current Meds  Medication Sig   albuterol (VENTOLIN HFA) 108 (90 Base) MCG/ACT inhaler INHALE 2 PUFFS BY MOUTH EVERY 6 HOURS AS NEEDED FOR COUGHING, WHEEZING, OR SHORTNESS OF BREATH   aspirin EC 81 MG tablet Take 81 mg by mouth daily. Swallow whole.   atorvastatin (LIPITOR) 80 MG tablet Take 1 tablet (80 mg total) by mouth daily.   budesonide-formoterol (SYMBICORT) 160-4.5 MCG/ACT inhaler Inhale 2 puffs into the lungs 2 (two) times daily.   Cholecalciferol (VITAMIN D3) 25 MCG (1000 UT) CAPS Take 1 capsule by mouth once daily   clopidogrel (PLAVIX) 75 MG tablet Take 1 tablet (75 mg total) by mouth daily.   ezetimibe (ZETIA) 10 MG tablet Take 1 tablet (10 mg total) by mouth daily.   furosemide (LASIX) 20 MG tablet Take 1 tablet (20 mg total) by mouth 2 (two) times daily.   gabapentin (NEURONTIN) 300 MG capsule Take 1 capsule by mouth twice daily   lisinopril-hydrochlorothiazide (ZESTORETIC) 10-12.5 MG tablet Take 1 tablet by mouth daily.   metoprolol tartrate (LOPRESSOR) 25 MG tablet Take 1 tablet by mouth twice daily   nitroGLYCERIN (NITROSTAT) 0.4 MG SL tablet Place 1 tablet (0.4 mg total) under the tongue every 5 (five) minutes as needed for chest pain.   pantoprazole (PROTONIX) 40  MG tablet Take 1 tablet by mouth once daily   sertraline (ZOLOFT) 50 MG tablet Take 1 tablet (50 mg total) by mouth daily.     No Known Allergies  Social History   Socioeconomic History   Marital status: Single    Spouse name: Not on file   Number of children: 0   Years of education: Not on file   Highest education level: Not on file  Occupational History   Not on file  Tobacco Use   Smoking status: Former    Current packs/day: 0.00    Average packs/day: 1 pack/day for 37.0 years (37.0  ttl pk-yrs)    Types: Cigarettes    Start date: 11/14/1986    Quit date: 07/22/2022    Years since quitting: 1.1   Smokeless tobacco: Never  Vaping Use   Vaping status: Never Used  Substance and Sexual Activity   Alcohol use: Yes    Alcohol/week: 18.0 standard drinks of alcohol    Types: 18 Cans of beer per week    Comment: 12 pack a week   Drug use: No   Sexual activity: Not on file  Other Topics Concern   Not on file  Social History Narrative   Right Handed    Lives in a one story home    Social Drivers of Health   Financial Resource Strain: Medium Risk (03/21/2022)   Overall Financial Resource Strain (CARDIA)    Difficulty of Paying Living Expenses: Somewhat hard  Food Insecurity: No Food Insecurity (03/21/2022)   Hunger Vital Sign    Worried About Running Out of Food in the Last Year: Never true    Ran Out of Food in the Last Year: Never true  Transportation Needs: No Transportation Needs (03/21/2022)   PRAPARE - Administrator, Civil Service (Medical): No    Lack of Transportation (Non-Medical): No  Physical Activity: Not on file  Stress: Not on file  Social Connections: Not on file  Intimate Partner Violence: Not on file     Review of Systems: General: negative for chills, fever, night sweats or weight changes.  Cardiovascular: negative for chest pain, dyspnea on exertion, edema, orthopnea, palpitations, paroxysmal nocturnal dyspnea or shortness of breath Dermatological: negative for rash Respiratory: negative for cough or wheezing Urologic: negative for hematuria Abdominal: negative for nausea, vomiting, diarrhea, bright red blood per rectum, melena, or hematemesis Neurologic: negative for visual changes, syncope, or dizziness All other systems reviewed and are otherwise negative except as noted above.    Blood pressure 116/76, pulse 83, height 6\' 6"  (1.981 m), weight 265 lb 1.6 oz (120.2 kg), SpO2 97%.  General appearance: alert and no  distress Neck: no adenopathy, no carotid bruit, no JVD, supple, symmetrical, trachea midline, and thyroid not enlarged, symmetric, no tenderness/mass/nodules Lungs: clear to auscultation bilaterally Heart: regular rate and rhythm, S1, S2 normal, no murmur, click, rub or gallop Extremities: extremities normal, atraumatic, no cyanosis or edema Pulses: 2+ and symmetric Skin: Skin color, texture, turgor normal. No rashes or lesions Neurologic: Grossly normal  EKG not performed today      ASSESSMENT AND PLAN:   Peripheral arterial disease (HCC) History of PAD status post peripheral angiography by myself 10/29/2021.  He had a right SFA CTO which I was able to cross, performed directional atherectomy followed by DCB.  His right ABI increased from 0.65 up to 0.80.  Over the last several months he has had progressive right lower extremity claudication with a decline in  his right ABI of 0.66.  He does have a high-frequency signal in his mid right SFA.  His claudication is lifestyle limiting and he wishes to proceed with angiography and potential endovascular therapy.     Runell Gess MD FACP,FACC,FAHA, [facility] 09/15/2023 12:00 PM

## 2023-09-15 NOTE — Patient Instructions (Addendum)
 Medication Instructions:  Your physician recommends that you continue on your current medications as directed. Please refer to the Current Medication list given to you today.  *If you need a refill on your cardiac medications before your next appointment, please call your pharmacy*   Lab Work: Your physician recommends that you have labs drawn today: BMET & CBC  If you have labs (blood work) drawn today and your tests are completely normal, you will receive your results only by: MyChart Message (if you have MyChart) OR A paper copy in the mail If you have any lab test that is abnormal or we need to change your treatment, we will call you to review the results.   Testing/Procedures: Your physician has requested that you have a lower extremity arterial duplex. During this test, ultrasound is used to evaluate arterial blood flow in the legs. Allow one hour for this exam. There are no restrictions or special instructions. This will take place at [address] [facility], [address]. **1-2 weeks after your procedure (4/10)**  Please note: We ask at that you not bring children with you during ultrasound (echo/ vascular) testing. Due to room size and safety concerns, children are not allowed in the ultrasound rooms during exams. Our front office staff cannot provide observation of children in our lobby area while testing is being conducted. An adult accompanying a patient to their appointment will only be allowed in the ultrasound room at the discretion of the ultrasound technician under special circumstances. We apologize for any inconvenience.  Your physician has requested that you have an ankle brachial index (ABI). During this test an ultrasound and blood pressure cuff are used to evaluate the arteries that supply the arms and legs with blood. Allow thirty minutes for this exam. There are no restrictions or special instructions. This will take place at [address] [facility], [address]. **1-2 weeks after your  procedure (4/10)**   Please note: We ask at that you not bring children with you during ultrasound (echo/ vascular) testing. Due to room size and safety concerns, children are not allowed in the ultrasound rooms during exams. Our front office staff cannot provide observation of children in our lobby area while testing is being conducted. An adult accompanying a patient to their appointment will only be allowed in the ultrasound room at the discretion of the ultrasound technician under special circumstances. We apologize for any inconvenience.    Follow-Up: At [facility], you and your health needs are our priority.  As part of our continuing mission to provide you with exceptional heart care, we have created designated Provider Care Teams.  These Care Teams include your primary Cardiologist (physician) and Advanced Practice Providers (APPs -  Physician Assistants and Nurse Practitioners) who all work together to provide you with the care you need, when you need it.  We recommend signing up for the patient portal called "MyChart".  Sign up information is provided on this After Visit Summary.  MyChart is used to connect with patients for Virtual Visits (Telemedicine).  Patients are able to view lab/test results, encounter notes, upcoming appointments, etc.  Non-urgent messages can be sent to your provider as well.   To learn more about what you can do with MyChart, go to [email or web address].    Your next appointment:   2-3 week(s) after procedure (4/10)  Provider:   [name], MD    Other Instructions       Cardiac/Peripheral Catheterization   You are scheduled for a Peripheral  Angiogram on Thursday, April 10 with Dr. [name].  1. Please arrive at the [facility] (Main Entrance A) at [facility]: [address] [place], [place] [address] at 7:30 AM (This time is 2 hour(s) before your procedure to ensure your preparation).   Free valet parking service is  available. You will check in at ADMITTING. The support person will be asked to wait in the waiting room.  It is OK to have someone drop you off and come back when you are ready to be discharged.        Special note: Every effort is made to have your procedure done on time. Please understand that emergencies sometimes delay scheduled procedures.  2. Diet: Do not eat solid foods after midnight.  You may have clear liquids until 5 AM the day of the procedure.  3. Labs: You will need to have blood drawn today (4/10)  4. Medication instructions in preparation for your procedure:  Stop taking, Lasix (Furosemide)  and Zestoretic Thursday, April 10,    On the morning of your procedure, take Aspirin 81 mg and Plavix/Clopidogrel and any morning medicines NOT listed above.  You may use sips of water.  5. Plan to go home the same day, you will only stay overnight if medically necessary. 6. You MUST have a responsible adult to drive you home. 7. An adult MUST be with you the first 24 hours after you arrive home. 8. Bring a current list of your medications, and the last time and date medication taken. 9. Bring ID and current insurance cards. 10.Please wear clothes that are easy to get on and off and wear slip-on shoes.  Thank you for allowing [place] to care for you!   -- [facility]

## 2023-09-15 NOTE — Assessment & Plan Note (Signed)
 History of PAD status post peripheral angiography by myself 10/29/2021.  He had a right SFA CTO which I was able to cross, performed directional atherectomy followed by DCB.  His right ABI increased from 0.65 up to 0.80.  Over the last several months he has had progressive right lower extremity claudication with a decline in his right ABI of 0.66.  He does have a high-frequency signal in his mid right SFA.  His claudication is lifestyle limiting and he wishes to proceed with angiography and potential endovascular therapy.

## 2023-09-15 NOTE — Progress Notes (Signed)
 09/15/2023 Timothy Nielsen   [birth year]  [id number]  Primary Physician Billie Lade, MD Primary Cardiologist: Runell Gess MD Nicholes Calamity, [place]  HPI:  Timothy Nielsen is a 53 y.o.    mildly overweight divorced Caucasian male with no children who is currently unemployed but previously worked for a tree company.  He was referred through the courtesy of Dr. Diona Browner, his cardiologist, for evaluation of lifestyle-limiting claudication.  I last saw him in the office 01/22/2023.  His risk factors include 35 pack years of tobacco abuse currently smoking 1/2 pack/day with the intent to quit.  He has treated hypertension and hyperlipidemia.  Both his parents had coronary intervention.  He is never had a heart attack or stroke.  He does complain of shortness of breath and occasional chest tightness.  He has COPD.  Recent had a 2D echo that was normal and a Myoview stress test that was nonischemic.  He has had lifestyle-limiting claudication for at least a year right greater than left.  He had Doppler studies performed at [facility] 07/26/2021 revealing right ABI of 0.69 and a left of 1.16.  I performed lower extremity arterial Doppler studies on him 10/04/2021 revealing a right ABI of 0.65 with an occluded right SFA.  I ultimately performed peripheral angiography and endovascular therapy on him 10/29/2021.  He had a right SFA CTO and had directional atherectomy followed by drug-coated balloon angioplasty.  He did have a small hematoma in his left groin which is resolving.  His Doppler studies performed 11/15/2021 revealed a widely patent right SFA.  His ABI increased from 0.65-0.80.  His claudication has significantly improved.  He did stop smoking briefly but has gone back to smoking 4 to 6 cigarettes a day.   Since I saw him in the office in July last year he has stopped smoking.  He had a heart catheterization by Dr.Thukkani on 04/14/2023 revealing minimal CAD.  He has had progressive right calf  claudication over the last several months with a decline in his right ABI to 0.66 with a high-frequency signal in his mid right SFA and wishes to proceed with outpatient peripheral angiography and endovascular therapy.   Current Meds  Medication Sig   albuterol (VENTOLIN HFA) 108 (90 Base) MCG/ACT inhaler INHALE 2 PUFFS BY MOUTH EVERY 6 HOURS AS NEEDED FOR COUGHING, WHEEZING, OR SHORTNESS OF BREATH   aspirin EC 81 MG tablet Take 81 mg by mouth daily. Swallow whole.   atorvastatin (LIPITOR) 80 MG tablet Take 1 tablet (80 mg total) by mouth daily.   budesonide-formoterol (SYMBICORT) 160-4.5 MCG/ACT inhaler Inhale 2 puffs into the lungs 2 (two) times daily.   Cholecalciferol (VITAMIN D3) 25 MCG (1000 UT) CAPS Take 1 capsule by mouth once daily   clopidogrel (PLAVIX) 75 MG tablet Take 1 tablet (75 mg total) by mouth daily.   ezetimibe (ZETIA) 10 MG tablet Take 1 tablet (10 mg total) by mouth daily.   furosemide (LASIX) 20 MG tablet Take 1 tablet (20 mg total) by mouth 2 (two) times daily.   gabapentin (NEURONTIN) 300 MG capsule Take 1 capsule by mouth twice daily   lisinopril-hydrochlorothiazide (ZESTORETIC) 10-12.5 MG tablet Take 1 tablet by mouth daily.   metoprolol tartrate (LOPRESSOR) 25 MG tablet Take 1 tablet by mouth twice daily   nitroGLYCERIN (NITROSTAT) 0.4 MG SL tablet Place 1 tablet (0.4 mg total) under the tongue every 5 (five) minutes as needed for chest pain.   pantoprazole (PROTONIX) 40  MG tablet Take 1 tablet by mouth once daily   sertraline (ZOLOFT) 50 MG tablet Take 1 tablet (50 mg total) by mouth daily.     No Known Allergies  Social History   Socioeconomic History   Marital status: Single    Spouse name: Not on file   Number of children: 0   Years of education: Not on file   Highest education level: Not on file  Occupational History   Not on file  Tobacco Use   Smoking status: Former    Current packs/day: 0.00    Average packs/day: 1 pack/day for 37.0 years (37.0  ttl pk-yrs)    Types: Cigarettes    Start date: 11/14/1986    Quit date: 07/22/2022    Years since quitting: 1.1   Smokeless tobacco: Never  Vaping Use   Vaping status: Never Used  Substance and Sexual Activity   Alcohol use: Yes    Alcohol/week: 18.0 standard drinks of alcohol    Types: 18 Cans of beer per week    Comment: 12 pack a week   Drug use: No   Sexual activity: Not on file  Other Topics Concern   Not on file  Social History Narrative   Right Handed    Lives in a one story home    Social Drivers of Health   Financial Resource Strain: Medium Risk (03/21/2022)   Overall Financial Resource Strain (CARDIA)    Difficulty of Paying Living Expenses: Somewhat hard  Food Insecurity: No Food Insecurity (03/21/2022)   Hunger Vital Sign    Worried About Running Out of Food in the Last Year: Never true    Ran Out of Food in the Last Year: Never true  Transportation Needs: No Transportation Needs (03/21/2022)   PRAPARE - Administrator, Civil Service (Medical): No    Lack of Transportation (Non-Medical): No  Physical Activity: Not on file  Stress: Not on file  Social Connections: Not on file  Intimate Partner Violence: Not on file     Review of Systems: General: negative for chills, fever, night sweats or weight changes.  Cardiovascular: negative for chest pain, dyspnea on exertion, edema, orthopnea, palpitations, paroxysmal nocturnal dyspnea or shortness of breath Dermatological: negative for rash Respiratory: negative for cough or wheezing Urologic: negative for hematuria Abdominal: negative for nausea, vomiting, diarrhea, bright red blood per rectum, melena, or hematemesis Neurologic: negative for visual changes, syncope, or dizziness All other systems reviewed and are otherwise negative except as noted above.    Blood pressure 116/76, pulse 83, height 6\' 6"  (1.981 m), weight 265 lb 1.6 oz (120.2 kg), SpO2 97%.  General appearance: alert and no  distress Neck: no adenopathy, no carotid bruit, no JVD, supple, symmetrical, trachea midline, and thyroid not enlarged, symmetric, no tenderness/mass/nodules Lungs: clear to auscultation bilaterally Heart: regular rate and rhythm, S1, S2 normal, no murmur, click, rub or gallop Extremities: extremities normal, atraumatic, no cyanosis or edema Pulses: 2+ and symmetric Skin: Skin color, texture, turgor normal. No rashes or lesions Neurologic: Grossly normal  EKG not performed today      ASSESSMENT AND PLAN:   Peripheral arterial disease (HCC) History of PAD status post peripheral angiography by myself 10/29/2021.  He had a right SFA CTO which I was able to cross, performed directional atherectomy followed by DCB.  His right ABI increased from 0.65 up to 0.80.  Over the last several months he has had progressive right lower extremity claudication with a decline in  his right ABI of 0.66.  He does have a high-frequency signal in his mid right SFA.  His claudication is lifestyle limiting and he wishes to proceed with angiography and potential endovascular therapy.     Runell Gess MD FACP,FACC,FAHA, [facility] 09/15/2023 12:00 PM

## 2023-09-16 LAB — BASIC METABOLIC PANEL
BUN/Creatinine Ratio: 12 (ref 9–20)
BUN: 12 mg/dL (ref 6–24)
CO2: 24 mmol/L (ref 20–29)
Calcium: 10.1 mg/dL (ref 8.7–10.2)
Chloride: 96 mmol/L (ref 96–106)
Creatinine, Ser: 0.97 mg/dL (ref 0.76–1.27)
Glucose: 112 mg/dL — ABNORMAL HIGH (ref 70–99)
Potassium: 4.3 mmol/L (ref 3.5–5.2)
Sodium: 137 mmol/L (ref 134–144)
eGFR: 93 mL/min/{1.73_m2} (ref >59)

## 2023-09-16 LAB — CBC
Hematocrit: 45.7 % (ref 37.5–51.0)
Hemoglobin: 16 g/dL (ref 13.0–17.7)
MCH: 31.7 pg (ref 26.6–33.0)
MCHC: 35 g/dL (ref 31.5–35.7)
MCV: 91 fL (ref 79–97)
Platelets: 244 10*3/uL (ref 150–450)
RBC: 5.04 x10E6/uL (ref 4.14–5.80)
RDW: 13.1 % (ref 11.6–15.4)
WBC: 9.1 10*3/uL (ref 3.4–10.8)

## 2023-09-23 ENCOUNTER — Other Ambulatory Visit: Payer: Self-pay | Admitting: Cardiovascular Disease

## 2023-09-23 DIAGNOSIS — I739 Peripheral vascular disease, unspecified: Secondary | ICD-10-CM

## 2023-09-30 ENCOUNTER — Telehealth: Payer: Self-pay | Admitting: *Deleted

## 2023-09-30 ENCOUNTER — Other Ambulatory Visit: Payer: Self-pay | Admitting: Internal Medicine

## 2023-09-30 DIAGNOSIS — G629 Polyneuropathy, unspecified: Secondary | ICD-10-CM

## 2023-09-30 NOTE — Telephone Encounter (Signed)
 Lower Extremity Angiogram scheduled at [facility] for: Thursday October 02, 2023 9:30 AM Arrival time [facility] Main Entrance A at: 7:30 AM  Nothing to eat after midnight prior to procedure, clear liquids until 5 AM day of procedure  Medication instructions: -Hold:  Lisinopril-HCT-AM of procedure  Lasix-AM of procedure  -Other usual morning medications can be taken with sips of water including aspirin 81 mg and Plavix 75 mg  Plan to go home the same day, you will only stay overnight if medically necessary.  You must have responsible adult to drive you home.  Someone must be with you the first 24 hours after you arrive home.  Reviewed procedure instructions with patient

## 2023-10-01 ENCOUNTER — Encounter (HOSPITAL_COMMUNITY)

## 2023-10-02 ENCOUNTER — Encounter (HOSPITAL_COMMUNITY)
Admission: RE | Disposition: A | Discharge status: Home / Self Care | Payer: Self-pay | Source: Home / Self Care | Attending: Cardiovascular Disease

## 2023-10-02 ENCOUNTER — Ambulatory Visit (HOSPITAL_COMMUNITY)
Admission: RE | Admit: 2023-10-02 | Discharge: 2023-10-03 | Disposition: A | Discharge status: Home / Self Care | Attending: Cardiovascular Disease | Admitting: Cardiovascular Disease

## 2023-10-02 ENCOUNTER — Other Ambulatory Visit: Payer: Self-pay

## 2023-10-02 DIAGNOSIS — E785 Hyperlipidemia, unspecified: Secondary | ICD-10-CM | POA: Insufficient documentation

## 2023-10-02 DIAGNOSIS — F17210 Nicotine dependence, cigarettes, uncomplicated: Secondary | ICD-10-CM | POA: Diagnosis not present

## 2023-10-02 DIAGNOSIS — I10 Essential (primary) hypertension: Secondary | ICD-10-CM | POA: Insufficient documentation

## 2023-10-02 DIAGNOSIS — Z79899 Other long term (current) drug therapy: Secondary | ICD-10-CM | POA: Diagnosis not present

## 2023-10-02 DIAGNOSIS — I739 Peripheral vascular disease, unspecified: Secondary | ICD-10-CM | POA: Diagnosis not present

## 2023-10-02 DIAGNOSIS — I70211 Atherosclerosis of native arteries of extremities with intermittent claudication, right leg: Secondary | ICD-10-CM | POA: Diagnosis not present

## 2023-10-02 DIAGNOSIS — J449 Chronic obstructive pulmonary disease, unspecified: Secondary | ICD-10-CM | POA: Insufficient documentation

## 2023-10-02 HISTORY — PX: LOWER EXTREMITY INTERVENTION: CATH118252

## 2023-10-02 HISTORY — PX: LOWER EXTREMITY ANGIOGRAPHY: CATH118251

## 2023-10-02 LAB — POCT ACTIVATED CLOTTING TIME
Activated Clotting Time: 273 s
Activated Clotting Time: 222 s
Activated Clotting Time: 170 s

## 2023-10-02 SURGERY — LOWER EXTREMITY ANGIOGRAPHY
Anesthesia: LOCAL

## 2023-10-02 MED ORDER — FUROSEMIDE 20 MG PO TABS
20.0000 mg | ORAL_TABLET | Freq: Two times a day (BID) | ORAL | Status: DC
  Administered 2023-10-02 – 2023-10-03 (×2): 20 mg via ORAL
  Filled 2023-10-02 (×3): qty 1

## 2023-10-02 MED ORDER — SODIUM CHLORIDE 0.9 % IV SOLN: 250.0000 mL | INTRAVENOUS | Status: DC | PRN

## 2023-10-02 MED ORDER — SODIUM CHLORIDE 0.9 % IV SOLN: INTRAVENOUS | Status: AC

## 2023-10-02 MED ORDER — IODIXANOL 320 MG/ML IV SOLN
INTRAVENOUS | Status: DC | PRN
  Administered 2023-10-02: 90 mL

## 2023-10-02 MED ORDER — PANTOPRAZOLE SODIUM 40 MG PO TBEC
40.0000 mg | DELAYED_RELEASE_TABLET | Freq: Every day | ORAL | Status: DC
  Administered 2023-10-03: 40 mg via ORAL
  Filled 2023-10-02: qty 1

## 2023-10-02 MED ORDER — ACETAMINOPHEN 325 MG PO TABS: 650.0000 mg | ORAL_TABLET | ORAL | Status: DC | PRN

## 2023-10-02 MED ORDER — HEPARIN SODIUM (PORCINE) 1000 UNIT/ML IJ SOLN
INTRAMUSCULAR | Status: DC | PRN
  Administered 2023-10-02: 12000 [IU] via INTRAVENOUS

## 2023-10-02 MED ORDER — CLOPIDOGREL BISULFATE 75 MG PO TABS
75.0000 mg | ORAL_TABLET | Freq: Every day | ORAL | Status: DC
Start: 2023-10-03 — End: 2023-10-02

## 2023-10-02 MED ORDER — MORPHINE SULFATE (PF) 2 MG/ML IV SOLN: 2.0000 mg | INTRAVENOUS | Status: DC | PRN

## 2023-10-02 MED ORDER — SODIUM CHLORIDE 0.9% FLUSH: 3.0000 mL | INTRAVENOUS | Status: DC | PRN

## 2023-10-02 MED ORDER — CLOPIDOGREL BISULFATE 75 MG PO TABS
75.0000 mg | ORAL_TABLET | Freq: Once | ORAL | Status: DC
  Filled 2023-10-02: qty 1

## 2023-10-02 MED ORDER — SODIUM CHLORIDE 0.9% FLUSH
3.0000 mL | Freq: Two times a day (BID) | INTRAVENOUS | Status: DC
  Administered 2023-10-02: 3 mL via INTRAVENOUS

## 2023-10-02 MED ORDER — HEPARIN SODIUM (PORCINE) 1000 UNIT/ML IJ SOLN
INTRAMUSCULAR | Status: AC
  Filled 2023-10-02: qty 10

## 2023-10-02 MED ORDER — ONDANSETRON HCL 4 MG/2ML IJ SOLN: 4.0000 mg | Freq: Four times a day (QID) | INTRAMUSCULAR | Status: DC | PRN

## 2023-10-02 MED ORDER — LISINOPRIL 10 MG PO TABS: 10.0000 mg | ORAL_TABLET | Freq: Every day | ORAL | Status: DC

## 2023-10-02 MED ORDER — CLOPIDOGREL BISULFATE 75 MG PO TABS
75.0000 mg | ORAL_TABLET | Freq: Every day | ORAL | Status: DC
  Administered 2023-10-03: 75 mg via ORAL
  Filled 2023-10-02: qty 1

## 2023-10-02 MED ORDER — ALBUTEROL SULFATE (2.5 MG/3ML) 0.083% IN NEBU: 3.0000 mL | INHALATION_SOLUTION | RESPIRATORY_TRACT | Status: DC | PRN

## 2023-10-02 MED ORDER — EZETIMIBE 10 MG PO TABS
10.0000 mg | ORAL_TABLET | Freq: Every day | ORAL | Status: DC
  Administered 2023-10-02 – 2023-10-03 (×2): 10 mg via ORAL
  Filled 2023-10-02 (×2): qty 1

## 2023-10-02 MED ORDER — GABAPENTIN 300 MG PO CAPS
300.0000 mg | ORAL_CAPSULE | Freq: Two times a day (BID) | ORAL | Status: DC
  Administered 2023-10-02 – 2023-10-03 (×2): 300 mg via ORAL
  Filled 2023-10-02 (×3): qty 1

## 2023-10-02 MED ORDER — ASPIRIN 81 MG PO TBEC
81.0000 mg | DELAYED_RELEASE_TABLET | Freq: Every day | ORAL | Status: DC
  Administered 2023-10-03: 81 mg via ORAL
  Filled 2023-10-02: qty 1

## 2023-10-02 MED ORDER — FENTANYL CITRATE (PF) 100 MCG/2ML IJ SOLN
INTRAMUSCULAR | Status: AC
  Filled 2023-10-02: qty 2

## 2023-10-02 MED ORDER — HEPARIN (PORCINE) IN NACL 1000-0.9 UT/500ML-% IV SOLN
INTRAVENOUS | Status: DC | PRN
  Administered 2023-10-02 (×3): 500 mL

## 2023-10-02 MED ORDER — HYDRALAZINE HCL 20 MG/ML IJ SOLN: 5.0000 mg | INTRAMUSCULAR | Status: DC | PRN

## 2023-10-02 MED ORDER — LISINOPRIL-HYDROCHLOROTHIAZIDE 10-12.5 MG PO TABS: 1.0000 | ORAL_TABLET | Freq: Every day | ORAL | Status: DC

## 2023-10-02 MED ORDER — METOPROLOL TARTRATE 25 MG PO TABS
25.0000 mg | ORAL_TABLET | Freq: Two times a day (BID) | ORAL | Status: DC
  Administered 2023-10-02 – 2023-10-03 (×2): 25 mg via ORAL
  Filled 2023-10-02 (×2): qty 1

## 2023-10-02 MED ORDER — HEPARIN SODIUM (PORCINE) 1000 UNIT/ML IJ SOLN
INTRAMUSCULAR | Status: AC
Start: 2023-10-02 — End: ?
  Filled 2023-10-02: qty 10

## 2023-10-02 MED ORDER — LISINOPRIL 10 MG PO TABS
10.0000 mg | ORAL_TABLET | Freq: Every day | ORAL | Status: DC
  Administered 2023-10-03: 10 mg via ORAL
  Filled 2023-10-02: qty 1

## 2023-10-02 MED ORDER — LIDOCAINE HCL (PF) 1 % IJ SOLN
INTRAMUSCULAR | Status: AC
  Filled 2023-10-02: qty 30

## 2023-10-02 MED ORDER — LABETALOL HCL 5 MG/ML IV SOLN: 10.0000 mg | INTRAVENOUS | Status: DC | PRN

## 2023-10-02 MED ORDER — ATORVASTATIN CALCIUM 80 MG PO TABS
80.0000 mg | ORAL_TABLET | Freq: Every day | ORAL | Status: DC
  Administered 2023-10-02 – 2023-10-03 (×2): 80 mg via ORAL
  Filled 2023-10-02: qty 2
  Filled 2023-10-02: qty 1

## 2023-10-02 MED ORDER — LIDOCAINE HCL (PF) 1 % IJ SOLN
INTRAMUSCULAR | Status: DC | PRN
  Administered 2023-10-02: 20 mL

## 2023-10-02 MED ORDER — HYDROCHLOROTHIAZIDE 25 MG PO TABS
25.0000 mg | ORAL_TABLET | Freq: Every day | ORAL | Status: DC
Start: 2023-10-03 — End: 2023-10-02

## 2023-10-02 MED ORDER — ASPIRIN 81 MG PO TBEC: 81.0000 mg | DELAYED_RELEASE_TABLET | Freq: Every day | ORAL | Status: DC

## 2023-10-02 MED ORDER — HYDROCHLOROTHIAZIDE 12.5 MG PO TABS
12.5000 mg | ORAL_TABLET | Freq: Every day | ORAL | Status: DC
  Administered 2023-10-03: 12.5 mg via ORAL
  Filled 2023-10-02: qty 1

## 2023-10-02 MED ORDER — FENTANYL CITRATE (PF) 100 MCG/2ML IJ SOLN
INTRAMUSCULAR | Status: DC | PRN
  Administered 2023-10-02: 25 ug via INTRAVENOUS

## 2023-10-02 MED ORDER — SODIUM CHLORIDE 0.9 % WEIGHT BASED INFUSION
1.0000 mL/kg/h | INTRAVENOUS | Status: DC
  Administered 2023-10-02: 1 mL/kg/h via INTRAVENOUS

## 2023-10-02 MED ORDER — SODIUM CHLORIDE 0.9 % WEIGHT BASED INFUSION: 3.0000 mL/kg/h | INTRAVENOUS | Status: DC

## 2023-10-02 SURGICAL SUPPLY — 22 items
BAG SNAP BAND KOVER 36X36 (MISCELLANEOUS) IMPLANT
BALLN MUSTANG 4X40X135 (BALLOONS) ×2 IMPLANT
BALLN MUSTANG 6.0X40 135 (BALLOONS) ×2 IMPLANT
BALLOON MUSTANG 4X40X135 (BALLOONS) IMPLANT
BALLOON MUSTANG 6.0X40 135 (BALLOONS) IMPLANT
CATH ANGIO 5F BER2 65CM (CATHETERS) IMPLANT
CATH ANGIO 5F PIGTAIL 65CM (CATHETERS) IMPLANT
CATH CROSS OVER TEMPO 5F (CATHETERS) IMPLANT
CATH QUICKCROSS .035X135CM (MICROCATHETER) IMPLANT
CATH STRAIGHT 5FR 65CM (CATHETERS) IMPLANT
COVER DOME SNAP 22 D (MISCELLANEOUS) IMPLANT
KIT ENCORE 26 ADVANTAGE (KITS) IMPLANT
SET ATX-X65L (MISCELLANEOUS) IMPLANT
SHEATH CATAPULT 6FR 45 (SHEATH) IMPLANT
SHEATH PINNACLE 5F 10CM (SHEATH) IMPLANT
SHEATH PINNACLE 6F 10CM (SHEATH) IMPLANT
SHEATH PROBE COVER 6X72 (BAG) IMPLANT
STENT ELUVIA 7X80X130 (Permanent Stent) IMPLANT
TAPE SHOOT N SEE (TAPE) IMPLANT
TRAY PV CATH (CUSTOM PROCEDURE TRAY) ×2 IMPLANT
WIRE HITORQ VERSACORE ST 145CM (WIRE) IMPLANT
WIRE VERSACORE LOC 115CM (WIRE) IMPLANT

## 2023-10-02 NOTE — Progress Notes (Signed)
 Pt arrived from ...cath.., A/ox 4...pt denies any pain, MD aware,CCMD called. CHG bath given,no further needs at this time

## 2023-10-02 NOTE — Plan of Care (Signed)
   Problem: Cardiovascular: Goal: Ability to achieve and maintain adequate cardiovascular perfusion will improve Outcome: Progressing Goal: Vascular access site(s) Level 0-1 will be maintained Outcome: Progressing

## 2023-10-02 NOTE — Interval H&P Note (Signed)
 History and Physical Interval Note:  10/02/2023 9:08 AM  [name]  has presented today for surgery, with the diagnosis of pad.  The various methods of treatment have been discussed with the patient and family. After consideration of risks, benefits and other options for treatment, the patient has consented to  Procedure(s): Lower Extremity Angiography (N/A) as a surgical intervention.  The patient's history has been reviewed, patient examined, no change in status, stable for surgery.  I have reviewed the patient's chart and labs.  Questions were answered to the patient's satisfaction.     [name]

## 2023-10-02 NOTE — Progress Notes (Signed)
 Site area: left groin 22F arterial sheath Site Prior to Removal:  Level 0 Pressure Applied For: 30 minutes Manual:   yes Patient Status During Pull:  left dp and pt pulses dopplered. Stab'e Post Pull Site:  Level 0 Post Pull Instructions Given:  yes Post Pull Pulses Present: left dp and pt pulses dopplered Dressing Applied:  gauze and tegaderm Bedrest begins @ 1220 Comments:

## 2023-10-02 NOTE — Progress Notes (Signed)
 Left groin noted to be bleeding at groin site. Dressing changed. MD Allyson Sabal notified

## 2023-10-03 ENCOUNTER — Encounter (HOSPITAL_COMMUNITY): Payer: Self-pay | Admitting: Cardiovascular Disease

## 2023-10-03 DIAGNOSIS — I739 Peripheral vascular disease, unspecified: Secondary | ICD-10-CM

## 2023-10-03 DIAGNOSIS — I70211 Atherosclerosis of native arteries of extremities with intermittent claudication, right leg: Secondary | ICD-10-CM | POA: Diagnosis not present

## 2023-10-03 LAB — CBC
HCT: 35.3 % — ABNORMAL LOW (ref 39.0–52.0)
Hemoglobin: 12 g/dL — ABNORMAL LOW (ref 13.0–17.0)
MCH: 30.9 pg (ref 26.0–34.0)
MCHC: 34 g/dL (ref 30.0–36.0)
MCV: 91 fL (ref 80.0–100.0)
Platelets: 198 10*3/uL (ref 150–400)
RBC: 3.88 MIL/uL — ABNORMAL LOW (ref 4.22–5.81)
RDW: 13.1 % (ref 11.5–15.5)
WBC: 8.3 10*3/uL (ref 4.0–10.5)
nRBC: 0 % (ref 0.0–0.2)

## 2023-10-03 LAB — BASIC METABOLIC PANEL WITH GFR
Anion gap: 7 (ref 5–15)
BUN: 9 mg/dL (ref 6–20)
CO2: 27 mmol/L (ref 22–32)
Calcium: 8.7 mg/dL — ABNORMAL LOW (ref 8.9–10.3)
Chloride: 98 mmol/L (ref 98–111)
Creatinine, Ser: 0.97 mg/dL (ref 0.61–1.24)
GFR, Estimated: >60 mL/min (ref >60)
Glucose, Bld: 112 mg/dL — ABNORMAL HIGH (ref 70–99)
Potassium: 3.5 mmol/L (ref 3.5–5.1)
Sodium: 132 mmol/L — ABNORMAL LOW (ref 135–145)

## 2023-10-03 LAB — LIPID PANEL
Cholesterol: 81 mg/dL (ref 0–200)
HDL: 27 mg/dL — ABNORMAL LOW (ref >40)
LDL Cholesterol: 29 mg/dL (ref 0–99)
Total CHOL/HDL Ratio: 3 ratio
Triglycerides: 124 mg/dL (ref <150)
VLDL: 25 mg/dL (ref 0–40)

## 2023-10-03 NOTE — Discharge Summary (Addendum)
 Discharge Summary    Patient ID: Timothy Nielsen MRN: [id number]; DOB: [birth year]  Admit date: 10/02/2023 Discharge date: 10/03/2023  PCP:  Billie Lade, MD   [facility] Providers Cardiologist:  Nona Dell, MD     Discharge Diagnoses    Principal Problem:   Claudication in peripheral vascular disease [facility])  Diagnostic Studies/Procedures    PV angiogram: 10/02/2023  Angiographic Data:    1: Abdominal aorta-widely patent 2: Left lower extremity-the left common and extrailiac arteries widely patent 3: Right lower extremity-90% fairly focal mid right SFA stenosis with three-vessel runoff   IMPRESSION: Mr. [name] has a high-grade mid right SFA area of "restenosis" contributing to his claudication with three-vessel runoff.  He had early restenosis 1 year after [facility] 1 directional atherectomy and DCB of a long CTO.  Plan will be for PTA and drug coated stenting.  Final Impression: Successful PTA and drug coated stenting using Eluvia self-expanding stent in the setting of mid right SFA restenosis 1 year after directional atherectomy followed by DCB.  Patient was already on aspirin and clopidogrel.  The sheath will be removed once the ACT falls below 170 pressure held.  He will be gently hydrated overnight and discharged home in the morning.  Will get lower extremity arterial Doppler studies in unrefined office next week and I will see him back 1 to 2 weeks thereafter.  He left the lab in stable condition.   Timothy Nielsen. MD, [facility] 10/02/2023 10:48 AM _____________   History of Present Illness     Timothy Nielsen is a 53 y.o. male with hypertension, HLD, tobacco use, COPD who is followed by Dr. Allyson Sabal as an outpatient. Recent had a 2D echo that was normal and a Myoview stress test that was nonischemic.  He has had lifestyle-limiting claudication for at least a year right greater than left. Doppler studies performed at [facility] 07/26/2021 revealing right ABI of 0.69 and  a left of 1.16. Underwent lower extremity arterial Doppler studies on him 10/04/2021 revealing a right ABI of 0.65 with an occluded right SFA. Dr. Allyson Sabal performed peripheral angiography and endovascular therapy on him 10/29/2021.  He had a right SFA CTO and had directional atherectomy followed by drug-coated balloon angioplasty.  He did have a small hematoma in his left groin. His Doppler studies performed 11/15/2021 revealed a widely patent right SFA.  His ABI increased from 0.65-0.80.  His claudication had significantly improved.  He did stop smoking briefly but has gone back to smoking 4 to 6 cigarettes a day. He had a heart catheterization by Dr.Thukkani on 04/14/2023 revealing minimal CAD.  He then developed progressive right calf claudication over the last several months with a decline in his right ABI to 0.66 with a high-frequency signal in his mid right SFA and wishes to proceed with outpatient peripheral angiography and endovascular therapy. Seen in the office on 3/24 with recommendations for outpatient PV angiogram.   Hospital Course     Underwent outpatient PV angiogram with successful PTA and drug coated stenting using Eluvia self-expanding stent in the setting of mid right SFA restenosis 1 year after directional atherectomy followed by DCB. Recommendations for DAPT with ASA/plavix. Observed overnight without complications. Morning labs stable. Able to ambulate without difficulty.   General: Well developed, well nourished, male appearing in no acute distress.  Neck: Supple without bruits, JVD. Lungs:  Resp regular and unlabored, CTA. Heart: RRR, S1, S2, no S3, S4, or murmur Abdomen: Soft, non-tender, non-distended with normoactive  bowel sounds. Extremities: No clubbing, cyanosis, edema. Distal pedal pulses are 2+ bilaterally. Left femoral cath site stable with minimal bruising , no hematoma Neuro: Alert and oriented X 3. Moves all extremities spontaneously. Psych: Normal  affect. _____________  Discharge Vitals Blood pressure (!) 159/81, pulse 78, temperature 98 F (36.7 C), temperature source Oral, resp. rate 18, height 6\' 6"  (1.981 m), weight 120.2 kg, SpO2 98%.  Filed Weights   10/02/23 0741  Weight: 120.2 kg    Labs & Radiologic Studies    CBC Recent Labs    10/03/23 0359  WBC 8.3  HGB 12.0*  HCT 35.3*  MCV 91.0  PLT 198   Basic Metabolic Panel Recent Labs    16/10/96 0359  NA 132*  K 3.5  CL 98  CO2 27  GLUCOSE 112*  BUN 9  CREATININE 0.97  CALCIUM 8.7*   Liver Function Tests No results for input(s): "AST", "ALT", "ALKPHOS", "BILITOT", "PROT", "ALBUMIN" in the last 72 hours. No results for input(s): "LIPASE", "AMYLASE" in the last 72 hours. High Sensitivity Troponin:   No results for input(s): "TROPONINIHS" in the last 720 hours.  BNP Invalid input(s): "POCBNP" D-Dimer No results for input(s): "DDIMER" in the last 72 hours. Hemoglobin A1C No results for input(s): "HGBA1C" in the last 72 hours. Fasting Lipid Panel Recent Labs    10/03/23 0359  CHOL 81  HDL 27*  LDLCALC 29  TRIG 045  CHOLHDL 3.0   Thyroid Function Tests No results for input(s): "TSH", "T4TOTAL", "T3FREE", "THYROIDAB" in the last 72 hours.  Invalid input(s): "FREET3" _____________  PERIPHERAL VASCULAR CATHETERIZATION Result Date: 10/02/2023 Images from the original result were not included.  [id number] LOCATION:  FACILITY: [facility] PHYSICIAN: Timothy Nielsen, M.D. [birth year] DATE OF PROCEDURE:  10/02/2023 DATE OF DISCHARGE: PV Angiogram/Intervention History obtained from chart review.Timothy Nielsen is a 53 y.o.    mildly overweight divorced Caucasian male with no children who is currently unemployed but previously worked for a tree [employer].  He was referred through the courtesy of Dr. Diona Browner, his cardiologist, for evaluation of lifestyle-limiting claudication.  I last saw him in the office 01/22/2023.  His risk factors include 35 pack years of tobacco abuse  currently smoking 1/2 pack/day with the intent to quit.  He has treated hypertension and hyperlipidemia.  Both his parents had coronary intervention.  He is never had a heart attack or stroke.  He does complain of shortness of breath and occasional chest tightness.  He has COPD.  Recent had a 2D echo that was normal and a Myoview stress test that was nonischemic.  He has had lifestyle-limiting claudication for at least a year right greater than left.  He had Doppler studies performed at [facility] 07/26/2021 revealing right ABI of 0.69 and a left of 1.16. I performed lower extremity arterial Doppler studies on him 10/04/2021 revealing a right ABI of 0.65 with an occluded right SFA.  I ultimately performed peripheral angiography and endovascular therapy on him 10/29/2021.  He had a right SFA CTO and had directional atherectomy followed by drug-coated balloon angioplasty.  He did have a small hematoma in his left groin which is resolving.  His Doppler studies performed 11/15/2021 revealed a widely patent right SFA.  His ABI increased from 0.65-0.80.  His claudication has significantly improved.  He did stop smoking briefly but has gone back to smoking 4 to 6 cigarettes a day.  Since I saw him in the office in July last year he has stopped smoking.  He had a heart catheterization by Dr.Thukkani on 04/14/2023 revealing minimal CAD.  He has had progressive right calf claudication over the last several months with a decline in his right ABI to 0.66 with a high-frequency signal in his mid right SFA and wishes to proceed with outpatient peripheral angiography and endovascular therapy. Pre Procedure Diagnosis: Peripheral arterial disease Post Procedure Diagnosis: Peripheral arterial disease Operators: Dr. Nanetta Nielsen Procedures Performed:  1.  Ultrasound-guided left common femoral access  2.  Abdominal aortogram/bilateral iliac angiogram  3.  Contralateral access (secondary catheter placement)  4.  PTA and drug-eluting  stenting mid right SFA PROCEDURE DESCRIPTION: The patient was brought to the second floor [facility] lab in the the postabsorptive state. He was not premedicated . His left groin was prepped and shaved in usual sterile fashion. Xylocaine 1% was used for local anesthesia. A 5 French sheath was inserted into the left common femoral artery using standard Seldinger technique.  Ultrasound was used to identify the left common femoral artery and guide access.  Digital images captured and placed in the patient's chart.  A 5 French pigtail catheter was then placed in the distal abdominal aorta.  Distal abdominal aortography and bilateral iliac angiography were performed.  Contralateral access was obtained with a crossover catheter and 5 French short endhole catheter over an 035 versa core wire.  I then performed right lower extremity angiography with runoff using bolus chase, digital subtraction and step table technique.  Omnipaque dye was used for the entirety of the case (90 cc cc contrast total to patient.  Retrograde ordered pressures monitored on the case.  Angiographic Data: 1: Abdominal aorta-widely patent 2: Left lower extremity-the left common and extrailiac arteries widely patent 3: Right lower extremity-90% fairly focal mid right SFA stenosis with three-vessel runoff   Mr. Cofer has a high-grade mid right SFA area of "restenosis" contributing to his claudication with three-vessel runoff.  He had early restenosis 1 year after [facility] 1 directional atherectomy and DCB of a long CTO.  Plan will be for PTA and drug coated stenting. Procedure Description: This 5 French sheath was exchanged for a 45 cm 6 [place] catapult sheath.  The patient received 12 units of heparin with a peak ACT of 273 and ending ACT of 222.  He was already on aspirin and clopidogrel.  I was able to get an 035 versa core wire across the lesion using a 035/135 cm long quick cross and total catheter.  I then predilated with a 4 mm x 4 cm  balloon and placed a 7 mm x 80 mm long Eluvia self-expanding drug-coated stent postdilated with a 6 mm x 4 cm balloon resulting reduction of a 90% stenosis to 0% residual.  There was a sheath malfunction with the hub coming up causing some bleeding.  The procedure was expedited and the sheath was removed and exchanged for a short 6 [place] sheath. Final Impression: Successful PTA and drug coated stenting using Eluvia self-expanding stent in the setting of mid right SFA restenosis 1 year after directional atherectomy followed by DCB.  Patient was already on aspirin and clopidogrel.  The sheath will be removed once the ACT falls below 170 pressure held.  He will be gently hydrated overnight and discharged home in the morning.  Will get lower extremity arterial Doppler studies in unrefined office next week and I will see him back 1 to 2 weeks thereafter.  He left the lab in stable condition. Timothy Nielsen. MD, [facility]  10/02/2023 10:48 AM    VAS [place] LOWER EXTREMITY ARTERIAL DUPLEX Result Date: 09/11/2023 LOWER EXTREMITY ARTERIAL DUPLEX STUDY Patient Name:  Timothy Nielsen  Date of Exam:   09/11/2023 Medical Rec #: [id number]    Accession #:    [id number] Date of Birth: 1971-04-20    Patient Gender: M Patient Age:   42 years Exam Location:  [facility] Procedure:      VAS [place] LOWER EXTREMITY ARTERIAL DUPLEX Referring Phys: Christiane Ha BERRY --------------------------------------------------------------------------------  Indications: Peripheral artery disease. High Risk Factors: Hypertension, past history of smoking, coronary artery                    disease.  Current ABI: right= 0.66/0.41, left=0.93/0.52 Comparison Study: 01/01/2023 Right lower extremity arterial duplex- right CFA                   30-49% stenosis (170 cm/s), SFA distal 75-99% stenosis (560                   cm/s), Performing Technologist: Gertie Fey MHA, RDMS, RVT, RDCS  Examination Guidelines: A complete evaluation includes B-mode imaging, spectral Doppler,  color Doppler, and power Doppler as needed of all accessible portions of each vessel. Bilateral testing is considered an integral part of a complete examination. Limited examinations for reoccurring indications may be performed as noted.  +-----------+--------+-----+---------------+----------+--------+ RIGHT      PSV cm/sRatioStenosis       Waveform  Comments +-----------+--------+-----+---------------+----------+--------+ CFA Distal 136                         triphasic          +-----------+--------+-----+---------------+----------+--------+ DFA        140                         triphasic          +-----------+--------+-----+---------------+----------+--------+ SFA Prox   60                          triphasic          +-----------+--------+-----+---------------+----------+--------+ SFA Mid    596     7.64 75-99% stenosismonophasic         +-----------+--------+-----+---------------+----------+--------+ SFA Distal 70                          monophasic         +-----------+--------+-----+---------------+----------+--------+ POP Prox   34                          monophasic         +-----------+--------+-----+---------------+----------+--------+ POP Distal 38                          monophasic         +-----------+--------+-----+---------------+----------+--------+ TP Trunk   40                          monophasic         +-----------+--------+-----+---------------+----------+--------+ ATA Prox   32                          monophasic         +-----------+--------+-----+---------------+----------+--------+ ATA Mid  31                          monophasic         +-----------+--------+-----+---------------+----------+--------+ ATA Distal 32                          monophasic         +-----------+--------+-----+---------------+----------+--------+ PTA Prox   23                          monophasic          +-----------+--------+-----+---------------+----------+--------+ PTA Mid    36                          monophasic         +-----------+--------+-----+---------------+----------+--------+ PTA Distal 41                          monophasic         +-----------+--------+-----+---------------+----------+--------+ PERO Prox  24                          monophasic         +-----------+--------+-----+---------------+----------+--------+ PERO Mid   15                          monophasic         +-----------+--------+-----+---------------+----------+--------+ PERO Distal15                          monophasic         +-----------+--------+-----+---------------+----------+--------+  +-----------+--------+-----+--------+-----------------+--------+ LEFT       PSV cm/sRatioStenosisWaveform         Comments +-----------+--------+-----+--------+-----------------+--------+ CFA Distal 108                  triphasic                 +-----------+--------+-----+--------+-----------------+--------+ DFA        81                   triphasic                 +-----------+--------+-----+--------+-----------------+--------+ SFA Prox   71                   triphasic                 +-----------+--------+-----+--------+-----------------+--------+ SFA Mid    98                   triphasic                 +-----------+--------+-----+--------+-----------------+--------+ SFA Distal 153                  triphasic                 +-----------+--------+-----+--------+-----------------+--------+ POP Prox   73                   triphasic                 +-----------+--------+-----+--------+-----------------+--------+ POP Distal 83                   triphasic                 +-----------+--------+-----+--------+-----------------+--------+  TP Trunk   75                   triphasic                  +-----------+--------+-----+--------+-----------------+--------+ ATA Prox   34                   dampened biphasic         +-----------+--------+-----+--------+-----------------+--------+ ATA Mid    26                   monophasic                +-----------+--------+-----+--------+-----------------+--------+ ATA Distal 29                   monophasic                +-----------+--------+-----+--------+-----------------+--------+ PTA Prox   72                   triphasic                 +-----------+--------+-----+--------+-----------------+--------+ PTA Mid    94                   triphasic                 +-----------+--------+-----+--------+-----------------+--------+ PTA Distal 33                   triphasic                 +-----------+--------+-----+--------+-----------------+--------+ PERO Prox  33                   monophasic                +-----------+--------+-----+--------+-----------------+--------+ PERO Mid   33                   monophasic                +-----------+--------+-----+--------+-----------------+--------+ PERO Distal36                   monophasic                +-----------+--------+-----+--------+-----------------+--------+  Summary: Right: 75-99% stenosis noted in the superficial femoral artery. Left: No evidence of hemodynamically significant arterial insufficiency in the left lower extremity arterial system.  See table(s) above for measurements and observations. Electronically signed by Charlton Haws MD on 09/11/2023 at 4:24:23 PM.    Final    VAS [place] ABI WITH/WO TBI Result Date: 09/11/2023  LOWER EXTREMITY DOPPLER STUDY Patient Name:  Timothy Nielsen  Date of Exam:   09/11/2023 Medical Rec #: [id number]    Accession #:    [id number] Date of Birth: [birth year]    Patient Gender: M Patient Age:   32 years Exam Location:  [facility] Procedure:      VAS [place] ABI WITH/WO TBI Referring Phys:  --------------------------------------------------------------------------------  Indications: Peripheral artery disease. High Risk Factors: Hypertension, hyperlipidemia, past history of smoking,                    coronary artery disease.  Comparison Study: 01/01/23 ABI/TBI- right=0.72/0.43, left=0.96/0.59 Performing Technologist: Gertie Fey MHA, RVT, RDCS, RDMS  Examination Guidelines: A complete evaluation includes at minimum, Doppler waveform signals and systolic blood pressure reading at the level of bilateral brachial, anterior tibial, and posterior tibial arteries, when vessel  segments are accessible. Bilateral testing is considered an integral part of a complete examination. Photoelectric Plethysmograph (PPG) waveforms and toe systolic pressure readings are included as required and additional duplex testing as needed. Limited examinations for reoccurring indications may be performed as noted.  ABI Findings: +---------+------------------+-----+---------+--------+ Right    Rt Pressure (mmHg)IndexWaveform Comment  +---------+------------------+-----+---------+--------+ Brachial 135                                      +---------+------------------+-----+---------+--------+ ATA      88                0.65 triphasic         +---------+------------------+-----+---------+--------+ PTA      89                0.66 triphasic         +---------+------------------+-----+---------+--------+ Great Toe55                0.41                   +---------+------------------+-----+---------+--------+ +---------+------------------+-----+---------+-------+ Left     Lt Pressure (mmHg)IndexWaveform Comment +---------+------------------+-----+---------+-------+ Brachial 116                                     +---------+------------------+-----+---------+-------+ ATA      118               0.87 triphasic        +---------+------------------+-----+---------+-------+ PTA       125               0.93 triphasic        +---------+------------------+-----+---------+-------+ Great Toe70                0.52                  +---------+------------------+-----+---------+-------+ +-------+-----------+-----------+------------+------------+ ABI/TBIToday's ABIToday's TBIPrevious ABIPrevious TBI +-------+-----------+-----------+------------+------------+ Right  0.66       0.41       0.72        0.43         +-------+-----------+-----------+------------+------------+ Left   0.93       0.52       0.96        0.59         +-------+-----------+-----------+------------+------------+  Bilateral ABIs and TBIs appear decreased compared to prior study on 01/01/2023.  Summary: Right: Resting right ankle-brachial index indicates moderate right lower extremity arterial disease. The right toe-brachial index is abnormal. Left: Resting left ankle-brachial index indicates mild left lower extremity arterial disease. The left toe-brachial index is abnormal. *See table(s) above for measurements and observations.  Electronically signed by Charlton Haws MD on 09/11/2023 at 3:49:42 PM.    Final    Disposition   Pt is being discharged home today in good condition.  Follow-up Plans & Appointments     Discharge Instructions     Call MD for:  redness, tenderness, or signs of infection (pain, swelling, redness, odor or green/yellow discharge around incision site)   Complete by: As directed    Diet - low sodium heart healthy   Complete by: As directed    Discharge instructions   Complete by: As directed    Groin Site Care Refer to this sheet in the next few weeks. These instructions provide you  with information on caring for yourself after your procedure. Your caregiver may also give you more specific instructions. Your treatment has been planned according to current medical practices, but problems sometimes occur. Call your caregiver if you have any problems or questions after your  procedure. HOME CARE INSTRUCTIONS You may shower 24 hours after the procedure. Remove the bandage (dressing) and gently wash the site with plain soap and water. Gently pat the site dry.  Do not apply powder or lotion to the site.  Do not sit in a bathtub, swimming pool, or whirlpool for 5 to 7 days.  No bending, squatting, or lifting anything over 10 pounds (4.5 kg) as directed by your caregiver.  Inspect the site at least twice daily.  Do not drive home if you are discharged the same day of the procedure. Have someone else drive you.  You may drive 24 hours after the procedure unless otherwise instructed by your caregiver.  What to expect: Any bruising will usually fade within 1 to 2 weeks.  Blood that collects in the tissue (hematoma) may be painful to the touch. It should usually decrease in size and tenderness within 1 to 2 weeks.  SEEK IMMEDIATE MEDICAL CARE IF: You have unusual pain at the groin site or down the affected leg.  You have redness, warmth, swelling, or pain at the groin site.  You have drainage (other than a small amount of blood on the dressing).  You have chills.  You have a fever or persistent symptoms for more than 72 hours.  You have a fever and your symptoms suddenly get worse.  Your leg becomes pale, cool, tingly, or numb.  You have heavy bleeding from the site. Hold pressure on the site. Marland Kitchen  PLEASE DO NOT MISS ANY DOSES OF YOUR PLAVIX!!!!! Also keep a log of you blood pressures and bring back to your follow up appt. Please call the office with any questions.   Patients taking blood thinners should generally stay away from medicines like ibuprofen, Advil, Motrin, naproxen, and Aleve due to risk of stomach bleeding. You may take Tylenol as directed or talk to your primary doctor about alternatives.  PLEASE ENSURE THAT YOU DO NOT RUN OUT OF YOUR PLAVIX. This medication is very important to remain on for at least 6months. IF you have issues obtaining this medication  due to cost please CALL the office 3-5 business days prior to running out in order to prevent missing doses of this medication.   Increase activity slowly   Complete by: As directed         Discharge Medications   Allergies as of 10/03/2023   No Known Allergies      Medication List     TAKE these medications    albuterol 108 (90 Base) MCG/ACT inhaler Commonly known as: VENTOLIN HFA INHALE 2 PUFFS BY MOUTH EVERY 6 HOURS AS NEEDED FOR COUGHING, WHEEZING, OR SHORTNESS OF BREATH   aspirin EC 81 MG tablet Take 81 mg by mouth daily. Swallow whole.   atorvastatin 80 MG tablet Commonly known as: LIPITOR Take 1 tablet (80 mg total) by mouth daily.   budesonide-formoterol 160-4.5 MCG/ACT inhaler Commonly known as: SYMBICORT Inhale 2 puffs into the lungs 2 (two) times daily.   clopidogrel 75 MG tablet Commonly known as: PLAVIX Take 1 tablet (75 mg total) by mouth daily.   ezetimibe 10 MG tablet Commonly known as: Zetia Take 1 tablet (10 mg total) by mouth daily.   furosemide 20 MG  tablet Commonly known as: Lasix Take 1 tablet (20 mg total) by mouth 2 (two) times daily.   gabapentin 300 MG capsule Commonly known as: NEURONTIN Take 1 capsule by mouth twice daily   lisinopril-hydrochlorothiazide 10-12.5 MG tablet Commonly known as: ZESTORETIC Take 1 tablet by mouth daily.   metoprolol tartrate 25 MG tablet Commonly known as: LOPRESSOR Take 1 tablet by mouth twice daily   nitroGLYCERIN 0.4 MG SL tablet Commonly known as: NITROSTAT Place 1 tablet (0.4 mg total) under the tongue every 5 (five) minutes as needed for chest pain.   pantoprazole 40 MG tablet Commonly known as: PROTONIX Take 1 tablet by mouth once daily   sertraline 50 MG tablet Commonly known as: ZOLOFT Take 1 tablet (50 mg total) by mouth daily.   Vitamin D3 25 MCG (1000 UT) Caps Take 1 capsule by mouth once daily        Outstanding Labs/Studies   Follow up dopplers  Duration of Discharge  Encounter: APP Time: 33 minutes   Signed, Laverda Page, NP 10/03/2023, 10:17 AM   Patient seen and examined. Agree with assessment and plan.  Patient currently feels well following successful procedure yesterday by Dr. Allyson Sabal with PTA and drug-eluting stenting of his mid right SFA.  The patient had experienced right upper leg pain.  Left femoral catheterization site is stable.  Blood pressure currently stable.  No JVD.  Lungs reveal decreased breath sounds from his greater than 30-year history of tobacco use.  Rhythm regular no S3 gallop.  Abdomen nontender.  No significant edema.  Patient has a remote history of tobacco abuse, and quit tobacco approximately 1 and half years ago.  Stable for discharge today.  Follow-up with family arterial Doppler studies in office next week and subsequent office visit evaluation with Dr. Allyson Sabal.  DC: 35 minutes Lennette Bihari, MD, [facility] 10/03/2023 10:34 AM

## 2023-10-03 NOTE — Progress Notes (Signed)
 Mobility Specialist Progress Note:   10/03/23 1021  Mobility  Activity Ambulated with assistance in hallway;Ambulated with assistance in room  Level of Assistance Standby assist, set-up cues, supervision of patient - no hands on  Assistive Device Cane  Distance Ambulated (ft) 260 ft  Activity Response Tolerated well  Mobility Referral Yes  Mobility visit 1 Mobility  Mobility Specialist Start Time (ACUTE ONLY) 1020  Mobility Specialist Stop Time (ACUTE ONLY) 1023  Mobility Specialist Time Calculation (min) (ACUTE ONLY) 3 min   Pt received in bed, agreeable to mobility session. Ambulated in hallway with cane and SBA. Tolerated well, c/o foot discomfort. Returned pt to room, lying down with all needs met, eager for d/c.   Feliciana Rossetti Mobility Specialist Please contact via SecureChat or  Rehab office at [phone]

## 2023-10-15 ENCOUNTER — Ambulatory Visit (HOSPITAL_COMMUNITY)
Admission: RE | Admit: 2023-10-15 | Discharge: 2023-10-15 | Disposition: A | Discharge status: Home / Self Care | Source: Ambulatory Visit | Attending: Cardiovascular Disease | Admitting: Cardiovascular Disease

## 2023-10-15 ENCOUNTER — Other Ambulatory Visit (HOSPITAL_COMMUNITY): Payer: Self-pay | Admitting: Cardiovascular Disease

## 2023-10-15 DIAGNOSIS — I739 Peripheral vascular disease, unspecified: Secondary | ICD-10-CM

## 2023-10-15 DIAGNOSIS — Z95820 Peripheral vascular angioplasty status with implants and grafts: Secondary | ICD-10-CM

## 2023-10-15 LAB — VAS US ABI WITH/WO TBI
Left ABI: 1.02
Right ABI: 1.02

## 2023-10-16 ENCOUNTER — Ambulatory Visit: Payer: Medicare Other | Admitting: Internal Medicine

## 2023-10-28 ENCOUNTER — Other Ambulatory Visit: Payer: Self-pay | Admitting: Internal Medicine

## 2023-10-28 ENCOUNTER — Ambulatory Visit: Attending: Cardiovascular Disease | Admitting: Cardiovascular Disease

## 2023-10-28 ENCOUNTER — Encounter: Payer: Self-pay | Admitting: Cardiovascular Disease

## 2023-10-28 VITALS — BP 92/50 | HR 76 | Ht 78.0 in | Wt 270.0 lb

## 2023-10-28 DIAGNOSIS — I739 Peripheral vascular disease, unspecified: Secondary | ICD-10-CM | POA: Diagnosis present

## 2023-10-28 DIAGNOSIS — J4540 Moderate persistent asthma, uncomplicated: Secondary | ICD-10-CM

## 2023-10-28 NOTE — Progress Notes (Signed)
 10/28/2023 Timothy Nielsen   Jul 03, 1970  [id number]  Primary Physician Tobi Fortes, MD Primary Cardiologist: Avanell Leigh MD Bennye Bravo, [place]  HPI:  Timothy Nielsen is a 53 y.o.    mildly overweight divorced Caucasian male with no children who is currently unemployed but previously worked for a tree company.  He was referred through the courtesy of Dr. Londa Rival, his cardiologist, for evaluation of lifestyle-limiting claudication.  I last saw him in the office 09/11/2023.  His risk factors include 35 pack years of tobacco abuse currently smoking 1/2 pack/day with the intent to quit.  He has treated hypertension and hyperlipidemia.  Both his parents had coronary intervention.  He is never had a heart attack or stroke.  He does complain of shortness of breath and occasional chest tightness.  He has COPD.  Recent had a 2D echo that was normal and a Myoview  stress test that was nonischemic.  He has had lifestyle-limiting claudication for at least a year right greater than left.  He had Doppler studies performed at [facility] 07/26/2021 revealing right ABI of 0.69 and a left of 1.16.  I performed lower extremity arterial Doppler studies on him 10/04/2021 revealing a right ABI of 0.65 with an occluded right SFA.  I ultimately performed peripheral angiography and endovascular therapy on him 10/29/2021.  He had a right SFA CTO and had directional atherectomy followed by drug-coated balloon angioplasty.  He did have a small hematoma in his left groin which is resolving.  His Doppler studies performed 11/15/2021 revealed a widely patent right SFA.  His ABI increased from 0.65-0.80.  His claudication has significantly improved.  He did stop smoking briefly but has gone back to smoking 4 to 6 cigarettes a day.   He had a heart catheterization by Dr.Thukkani on 04/14/2023 revealing minimal CAD.  He has had progressive right calf claudication over the last several months with a decline in his right ABI  to 0.66 with a high-frequency signal in his mid right SFA and wishes to proceed with outpatient peripheral angiography and endovascular therapy.  He returns today for follow-up after his angiogram which I performed/10/25.  He had a 90% mid right SFA stenosis which I stented using any Eluvia 7 mm x 80 mm long drug-eluting stent.  He was discharged the following day.  His claudication is resolved and his Dopplers normalized 10/15/23.  He remains on DAPT.   Current Meds  Medication Sig   albuterol  (VENTOLIN  HFA) 108 (90 Base) MCG/ACT inhaler INHALE 2 PUFFS BY MOUTH EVERY 6 HOURS AS NEEDED FOR COUGHING, WHEEZING, OR SHORTNESS OF BREATH   aspirin  EC 81 MG tablet Take 81 mg by mouth daily. Swallow whole.   atorvastatin  (LIPITOR) 80 MG tablet Take 1 tablet (80 mg total) by mouth daily.   BREYNA  160-4.5 MCG/ACT inhaler Inhale 2 puffs by mouth twice daily   Cholecalciferol (VITAMIN D3) 25 MCG (1000 UT) CAPS Take 1 capsule by mouth once daily   clopidogrel  (PLAVIX ) 75 MG tablet Take 1 tablet (75 mg total) by mouth daily.   ezetimibe  (ZETIA ) 10 MG tablet Take 1 tablet (10 mg total) by mouth daily.   furosemide  (LASIX ) 20 MG tablet Take 1 tablet (20 mg total) by mouth 2 (two) times daily.   gabapentin  (NEURONTIN ) 300 MG capsule Take 1 capsule by mouth twice daily   lisinopril -hydrochlorothiazide  (ZESTORETIC ) 10-12.5 MG tablet Take 1 tablet by mouth daily.   metoprolol  tartrate (LOPRESSOR ) 25 MG tablet Take  1 tablet by mouth twice daily   nitroGLYCERIN  (NITROSTAT ) 0.4 MG SL tablet Place 1 tablet (0.4 mg total) under the tongue every 5 (five) minutes as needed for chest pain.   pantoprazole  (PROTONIX ) 40 MG tablet Take 1 tablet by mouth once daily   sertraline  (ZOLOFT ) 50 MG tablet Take 1 tablet (50 mg total) by mouth daily.     No Known Allergies  Social History   Socioeconomic History   Marital status: Single    Spouse name: Not on file   Number of children: 0   Years of education: Not on file    Highest education level: Not on file  Occupational History   Not on file  Tobacco Use   Smoking status: Former    Current packs/day: 0.00    Average packs/day: 1 pack/day for 37.0 years (37.0 ttl pk-yrs)    Types: Cigarettes    Start date: 11/14/1986    Quit date: 07/22/2022    Years since quitting: 1.2   Smokeless tobacco: Never  Vaping Use   Vaping status: Never Used  Substance and Sexual Activity   Alcohol use: Yes    Alcohol/week: 18.0 standard drinks of alcohol    Types: 18 Cans of beer per week    Comment: 12 pack a week   Drug use: No   Sexual activity: Not on file  Other Topics Concern   Not on file  Social History Narrative   Right Handed    Lives in a one story home    Social Drivers of Health   Financial Resource Strain: Medium Risk (03/21/2022)   Overall Financial Resource Strain (CARDIA)    Difficulty of Paying Living Expenses: Somewhat hard  Food Insecurity: No Food Insecurity (10/03/2023)   Hunger Vital Sign    Worried About Running Out of Food in the Last Year: Never true    Ran Out of Food in the Last Year: Never true  Transportation Needs: No Transportation Needs (10/03/2023)   PRAPARE - Administrator, Civil Service (Medical): No    Lack of Transportation (Non-Medical): No  Physical Activity: Not on file  Stress: Not on file  Social Connections: Not on file  Intimate Partner Violence: Not on file     Review of Systems: General: negative for chills, fever, night sweats or weight changes.  Cardiovascular: negative for chest pain, dyspnea on exertion, edema, orthopnea, palpitations, paroxysmal nocturnal dyspnea or shortness of breath Dermatological: negative for rash Respiratory: negative for cough or wheezing Urologic: negative for hematuria Abdominal: negative for nausea, vomiting, diarrhea, bright red blood per rectum, melena, or hematemesis Neurologic: negative for visual changes, syncope, or dizziness All other systems reviewed and are  otherwise negative except as noted above.    Blood pressure (!) 92/50, pulse 76, height 6\' 6"  (1.981 m), weight 270 lb (122.5 kg).  General appearance: alert and no distress Neck: no adenopathy, no carotid bruit, no JVD, supple, symmetrical, trachea midline, and thyroid  not enlarged, symmetric, no tenderness/mass/nodules Lungs: clear to auscultation bilaterally Heart: regular rate and rhythm, S1, S2 normal, no murmur, click, rub or gallop Extremities: extremities normal, atraumatic, no cyanosis or edema Pulses: 2+ and symmetric Skin: Skin color, texture, turgor normal. No rashes or lesions Neurologic: Grossly normal  EKG not performed today      ASSESSMENT AND PLAN:   Claudication in peripheral vascular disease (HCC) History of PAD status post angiography by myself 10/29/2021 revealing a right SFA CTO.  I performed directional atherectomy followed by  DCB.  Because of recurrent claudication with a decline in his right ABI and a high-frequency signal in his mid right SFA I reangiogram him/10/25 and placed a 7 mm x 80 mm long Eluvia drug-eluting stent with excellent result.  His follow-up Dopplers performed/23/25 revealed complete normalization.  Claudication has resolved.  He remains on aspirin  clopidogrel .  He has stopped smoking.     Avanell Leigh MD FACP,FACC,FAHA, [facility] 10/28/2023 11:59 AM

## 2023-10-28 NOTE — Patient Instructions (Signed)
 Medication Instructions:  Your physician recommends that you continue on your current medications as directed. Please refer to the Current Medication list given to you today.  *If you need a refill on your cardiac medications before your next appointment, please call your pharmacy*   Testing/Procedures: Your physician has requested that you have a lower extremity arterial duplex. During this test, ultrasound is used to evaluate arterial blood flow in the legs. Allow one hour for this exam. There are no restrictions or special instructions. This will take place at [address] 4th floor **To do in October**  Please note: We ask at that you not bring children with you during ultrasound (echo/ vascular) testing. Due to room size and safety concerns, children are not allowed in the ultrasound rooms during exams. Our front office staff cannot provide observation of children in our lobby area while testing is being conducted. An adult accompanying a patient to their appointment will only be allowed in the ultrasound room at the discretion of the ultrasound technician under special circumstances. We apologize for any inconvenience.  Your physician has requested that you have an ankle brachial index (ABI). During this test an ultrasound and blood pressure cuff are used to evaluate the arteries that supply the arms and legs with blood. Allow thirty minutes for this exam. There are no restrictions or special instructions. This will take place at [address], 4th floor  **To do in October**   Please note: We ask at that you not bring children with you during ultrasound (echo/ vascular) testing. Due to room size and safety concerns, children are not allowed in the ultrasound rooms during exams. Our front office staff cannot provide observation of children in our lobby area while testing is being conducted. An adult accompanying a patient to their appointment will only be allowed in the ultrasound room at  the discretion of the ultrasound technician under special circumstances. We apologize for any inconvenience.   Follow-Up: At [facility], you and your health needs are our priority.  As part of our continuing mission to provide you with exceptional heart care, our providers are all part of one team.  This team includes your primary Cardiologist (physician) and Advanced Practice Providers or APPs (Physician Assistants and Nurse Practitioners) who all work together to provide you with the care you need, when you need it.  Your next appointment:   12 month(s)  Provider:   Lauro Portal, MD    We recommend signing up for the patient portal called "MyChart".  Sign up information is provided on this After Visit Summary.  MyChart is used to connect with patients for Virtual Visits (Telemedicine).  Patients are able to view lab/test results, encounter notes, upcoming appointments, etc.  Non-urgent messages can be sent to your provider as well.   To learn more about what you can do with MyChart, go to [email or web address].

## 2023-10-28 NOTE — Assessment & Plan Note (Signed)
 History of PAD status post angiography by myself 10/29/2021 revealing a right SFA CTO.  I performed directional atherectomy followed by DCB.  Because of recurrent claudication with a decline in his right ABI and a high-frequency signal in his mid right SFA I reangiogram him/10/25 and placed a 7 mm x 80 mm long Eluvia drug-eluting stent with excellent result.  His follow-up Dopplers performed/23/25 revealed complete normalization.  Claudication has resolved.  He remains on aspirin  clopidogrel .  He has stopped smoking.

## 2023-10-31 ENCOUNTER — Other Ambulatory Visit: Payer: Self-pay | Admitting: Internal Medicine

## 2023-10-31 DIAGNOSIS — G629 Polyneuropathy, unspecified: Secondary | ICD-10-CM

## 2023-11-03 ENCOUNTER — Encounter (HOSPITAL_BASED_OUTPATIENT_CLINIC_OR_DEPARTMENT_OTHER): Payer: Self-pay | Admitting: Adult Health

## 2023-11-03 ENCOUNTER — Ambulatory Visit (HOSPITAL_BASED_OUTPATIENT_CLINIC_OR_DEPARTMENT_OTHER): Admitting: Adult Health

## 2023-11-03 VITALS — BP 120/81 | HR 78 | Ht 78.0 in | Wt 265.2 lb

## 2023-11-03 DIAGNOSIS — J4540 Moderate persistent asthma, uncomplicated: Secondary | ICD-10-CM | POA: Diagnosis not present

## 2023-11-03 DIAGNOSIS — G4733 Obstructive sleep apnea (adult) (pediatric): Secondary | ICD-10-CM | POA: Insufficient documentation

## 2023-11-03 DIAGNOSIS — F17200 Nicotine dependence, unspecified, uncomplicated: Secondary | ICD-10-CM | POA: Diagnosis not present

## 2023-11-03 DIAGNOSIS — J4489 Other specified chronic obstructive pulmonary disease: Secondary | ICD-10-CM | POA: Insufficient documentation

## 2023-11-03 MED ORDER — BUDESONIDE-FORMOTEROL FUMARATE 160-4.5 MCG/ACT IN AERO: 2.0000 | INHALATION_SPRAY | Freq: Two times a day (BID) | RESPIRATORY_TRACT | 5 refills | Status: AC

## 2023-11-03 NOTE — Progress Notes (Signed)
 @Patient  ID: Timothy Nielsen, male    DOB: 1970-08-27, 53 y.o.   MRN: [id number]  Chief Complaint  Patient presents with   Follow-up    Referring provider: Tobi Fortes, MD  HPI: 53 year old male former smoker seen for consult July 28, 2023 to establish for COPD with asthma and suspected sleep apnea with snoring and daytime sleepiness. Medical history significant for coronary artery disease, peripheral vascular disease status post DES of the mid right SFA  TEST/EVENTS :  PFTs September 17, 2022 showed moderate airflow obstruction with reversibility.  Postbronchodilator FEV1 76%, ratio 68, FVC 86%, positive bronchodilator response, DLCO 85%.  11/03/2023 Follow up : COPD and OSA  Patient returns for a 56-month follow-up.  Patient was seen last visit for a consult for snoring and daytime sleepiness.  The patient was set up for home sleep study that was done on August 06, 2023.  This shows severe sleep apnea with AHI 37.2/hour and SpO2 low to 81%.  Patient was started on CPAP therapy.  Patient says he has been unable to tolerate CPAP.  Wore it for about 2 weeks but was very uncomfortable.  Download shows 24% compliance.  Daily average usage on 3 hours.  Patient is on auto CPAP 5 to 15 cm H2O.  AHI is 1.1/hour. Tried nasal mask but is unable to tolerate. Feels claustrophobic. Wants to try full face mask.    Patient has a history of COPD with asthma.  He is a former smoker.  Quit smoking in January 2024.  Has a 37-year pack history.  Participates in the lung cancer program, CT chest August 23, 2022 showed a lung RADS 2S with small pulmonary nodule in the left lower lobe measuring 3.4 mm.,  Emphysema.  He is doing okay does get short of breath with prolonged walking.  No flare of cough or wheezing.  Remains on Symbicort  twice daily.  Uses albuterol  on occasion.    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    Tobacco History: Social History   Tobacco Use  Smoking Status Former   Current packs/day: 0.00   Average packs/day: 1 pack/day for 37.0 years (37.0 ttl pk-yrs)   Types: Cigarettes   Start date: 11/14/1986   Quit date: 07/22/2022   Years since quitting: 1.2  Smokeless Tobacco Never   Counseling given: Not Answered   Outpatient Medications Prior to Visit  Medication Sig Dispense Refill   albuterol  (VENTOLIN  HFA) 108 (90 Base) MCG/ACT inhaler INHALE 2 PUFFS BY MOUTH EVERY 6 HOURS AS NEEDED FOR COUGHING, WHEEZING, OR SHORTNESS OF BREATH 3 each 0   aspirin  EC 81 MG tablet Take 81 mg by mouth daily. Swallow whole.     atorvastatin  (LIPITOR) 80 MG tablet Take 1 tablet (80 mg total) by mouth daily. 90 tablet 0   Cholecalciferol (VITAMIN D3) 25 MCG (1000 UT) CAPS Take 1 capsule by mouth once daily 90 capsule 0   clopidogrel  (PLAVIX ) 75 MG tablet Take 1 tablet (75 mg total) by mouth daily. 90 tablet 3   ezetimibe  (ZETIA ) 10 MG tablet Take 1 tablet (10 mg total) by mouth daily. 90 tablet 3  furosemide  (LASIX ) 20 MG tablet Take 1 tablet (20 mg total) by mouth 2 (two) times daily. 60 tablet 11   gabapentin  (NEURONTIN ) 300 MG capsule Take 1 capsule by mouth twice daily 60 capsule 0   lisinopril -hydrochlorothiazide  (ZESTORETIC ) 10-12.5 MG tablet Take 1 tablet by mouth daily. 90 tablet 3   metoprolol  tartrate (LOPRESSOR ) 25 MG tablet Take 1 tablet by mouth twice daily 180 tablet 0   nitroGLYCERIN  (NITROSTAT ) 0.4 MG SL tablet Place 1 tablet (0.4 mg total) under the tongue every 5 (five) minutes as needed for chest pain. 25 tablet 3   pantoprazole  (PROTONIX ) 40 MG tablet Take 1 tablet by mouth once daily 90 tablet 0   sertraline   (ZOLOFT ) 50 MG tablet Take 1 tablet (50 mg total) by mouth daily. 90 tablet 0   BREYNA  160-4.5 MCG/ACT inhaler Inhale 2 puffs by mouth twice daily 11 g 0   No facility-administered medications prior to visit.     Review of Systems:   Constitutional:   No  weight loss, night sweats,  Fevers, chills, +fatigue, or  lassitude.  HEENT:   No headaches,  Difficulty swallowing,  Tooth/dental problems, or  Sore throat,                No sneezing, itching, ear ache, nasal congestion, post nasal drip,   CV:  No chest pain,  Orthopnea, PND, swelling in lower extremities, anasarca, dizziness, palpitations, syncope.   GI  No heartburn, indigestion, abdominal pain, nausea, vomiting, diarrhea, change in bowel habits, loss of appetite, bloody stools.   Resp:  No chest wall deformity  Skin: no rash or lesions.  GU: no dysuria, change in color of urine, no urgency or frequency.  No flank pain, no hematuria   MS:  No joint pain or swelling.  No decreased range of motion.  No back pain.    Physical Exam  BP 120/81   Pulse 78   Ht 6\' 6"  (1.981 m)   Wt 265 lb 3.2 oz (120.3 kg)   SpO2 99%   BMI 30.65 kg/m   GEN: A/Ox3; pleasant , NAD, well nourished    HEENT:  [place]/AT,  NOSE-clear, THROAT-clear, no lesions, no postnasal drip or exudate noted.   NECK:  Supple w/ fair ROM; no JVD; normal carotid impulses w/o bruits; no thyromegaly or nodules palpated; no lymphadenopathy.    RESP  Clear  P & A; w/o, wheezes/ rales/ or rhonchi. no accessory muscle use, no dullness to percussion  CARD:  RRR, no m/r/g, no peripheral edema, pulses intact, no cyanosis or clubbing.  GI:   Soft & nt; nml bowel sounds; no organomegaly or masses detected.   Musco: Warm bil, no deformities or joint swelling noted.   Neuro: alert, no focal deficits noted.    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  Patient is doing well on Symbicort  twice daily.  Continue on current maintenance regimen.  Albuterol  as needed.  Activity as tolerated.  Patient is overdue for his lung cancer CT screening.  Referral to the lung cancer screening program reminder sent .   Plan  Patient Instructions  Send order for full face mask -CPAP  Wear CPAP At bedtime, all night long  Do not drive if sleepy  Work on healthy weight loss   Continue on Symbicort  2 puffs Twice daily , rinse after use  Albuterol  inhaler As needed    Refer to Lung cancer screening program .   Follow up with Dr. Villa Greaser  in 4 months and As needed        OSA (obstructive sleep apnea) Severe obstructive sleep apnea.  Patient is having difficulty tolerating CPAP.  Will change to a fullface mask.  Patient is having trouble with using a nasal mask currently.  Previous download does show when he wears his CPAP that he has significant improvement in his number of sleep apneic events.  Plan  Patient Instructions  Send order for full face mask -CPAP  Wear CPAP At bedtime, all night long  Do not drive if sleepy  Work on healthy weight loss   Continue on Symbicort  2 puffs Twice daily , rinse after use  Albuterol  inhaler As needed    Refer to Lung cancer screening program .   Follow up with Dr. Villa Greaser  in 4 months and As needed        Tobacco use disorder Former smoker with heavy smoking history.  Overdue for lung cancer CT screening program.  Referral  reminder sent     Roena Clark, NP 11/03/2023

## 2023-11-03 NOTE — Assessment & Plan Note (Signed)
 Severe obstructive sleep apnea.  Patient is having difficulty tolerating CPAP.  Will change to a fullface mask.  Patient is having trouble with using a nasal mask currently.  Previous download does show when he wears his CPAP that he has significant improvement in his number of sleep apneic events.  Plan  Patient Instructions  Send order for full face mask -CPAP  Wear CPAP At bedtime, all night long  Do not drive if sleepy  Work on healthy weight loss   Continue on Symbicort  2 puffs Twice daily , rinse after use  Albuterol  inhaler As needed    Refer to Lung cancer screening program .   Follow up with Dr. Villa Greaser  in 4 months and As needed

## 2023-11-03 NOTE — Patient Instructions (Addendum)
 Send order for full face mask -CPAP  Wear CPAP At bedtime, all night long  Do not drive if sleepy  Work on healthy weight loss   Continue on Symbicort  2 puffs Twice daily , rinse after use  Albuterol  inhaler As needed    Refer to Lung cancer screening program .   Follow up with Dr. Villa Greaser  in 4 months and As needed

## 2023-11-03 NOTE — Assessment & Plan Note (Signed)
 Moderate COPD with asthma.  Patient is doing well on Symbicort  twice daily.  Continue on current maintenance regimen.  Albuterol  as needed.  Activity as tolerated.  Patient is overdue for his lung cancer CT screening.  Referral to the lung cancer screening program reminder sent .   Plan  Patient Instructions  Send order for full face mask -CPAP  Wear CPAP At bedtime, all night long  Do not drive if sleepy  Work on healthy weight loss   Continue on Symbicort  2 puffs Twice daily , rinse after use  Albuterol  inhaler As needed    Refer to Lung cancer screening program .   Follow up with Dr. Villa Greaser  in 4 months and As needed

## 2023-11-03 NOTE — Assessment & Plan Note (Signed)
 Former smoker with heavy smoking history.  Overdue for lung cancer CT screening program.  Referral reminder sent

## 2023-11-05 ENCOUNTER — Telehealth (HOSPITAL_BASED_OUTPATIENT_CLINIC_OR_DEPARTMENT_OTHER): Payer: Self-pay

## 2023-11-05 NOTE — Telephone Encounter (Signed)
 Pt was recently seen by provider on 05/12, where he discussed having issues with his CPAP face mask. He reports the mask feels too tight on his nose, so he and provider selected a new mask during his visit.He has spoken with his DME company([employer]) several times and is unable to get answers concerning the new mask. An order was sent to [employer] for the mask; however, they advised pt the new order does not indicate the specific full face mask that is wanted.They also advised pt that his insurance will not cover the new mask, even with a new order. He is asking if it could be verified through his insurance if the mask will be covered.Pt requesting call back, he is very frustrated and is not able to information from [employer].CB#[phone]

## 2023-11-06 NOTE — Telephone Encounter (Signed)
 Can try Resmed F30i full face mask. The insurance sets the frequency when he can get new mask. Please see how much this will be through DME if insurance does not cover  Can also try out of pocket at [facility] and [email or web address]

## 2023-11-07 NOTE — Telephone Encounter (Signed)
 Spoke with [employer] she states Medicare will only cover a new mask every 6 months and he just received his on 08/22/2023. Spoke with pt he is aware now that they wont cover this until August 28 he is going to try to find the mask on [employer]. NFN

## 2023-11-13 ENCOUNTER — Telehealth (HOSPITAL_BASED_OUTPATIENT_CLINIC_OR_DEPARTMENT_OTHER): Payer: Self-pay | Admitting: Pulmonary Disease

## 2023-11-13 NOTE — Telephone Encounter (Signed)
 Copied from CRM [id number]. Topic: General - Other >> Nov 13, 2023  2:00 PM Tianna S wrote: Reason for CRM: patient would like to speak to someone in regards to figuring out how he can get an implanted inspire device, please call patient.

## 2023-11-13 NOTE — Telephone Encounter (Signed)
**Note De-identified  [place] Obfuscation** Please advise 

## 2023-11-13 NOTE — Telephone Encounter (Signed)
 Tried to call , straight to voicemail. Will call back when get back to office on 11/18/23

## 2023-11-14 ENCOUNTER — Telehealth: Payer: Self-pay | Admitting: Acute Care

## 2023-11-14 ENCOUNTER — Encounter: Payer: Self-pay | Admitting: Internal Medicine

## 2023-11-14 ENCOUNTER — Ambulatory Visit (INDEPENDENT_AMBULATORY_CARE_PROVIDER_SITE_OTHER): Admitting: Internal Medicine

## 2023-11-14 ENCOUNTER — Telehealth: Payer: Self-pay

## 2023-11-14 VITALS — BP 108/71 | HR 79 | Ht 78.0 in | Wt 268.0 lb

## 2023-11-14 DIAGNOSIS — I10 Essential (primary) hypertension: Secondary | ICD-10-CM | POA: Diagnosis not present

## 2023-11-14 DIAGNOSIS — Z87891 Personal history of nicotine dependence: Secondary | ICD-10-CM

## 2023-11-14 DIAGNOSIS — J4489 Other specified chronic obstructive pulmonary disease: Secondary | ICD-10-CM

## 2023-11-14 DIAGNOSIS — G629 Polyneuropathy, unspecified: Secondary | ICD-10-CM | POA: Insufficient documentation

## 2023-11-14 DIAGNOSIS — F17210 Nicotine dependence, cigarettes, uncomplicated: Secondary | ICD-10-CM

## 2023-11-14 DIAGNOSIS — G6289 Other specified polyneuropathies: Secondary | ICD-10-CM

## 2023-11-14 DIAGNOSIS — G4733 Obstructive sleep apnea (adult) (pediatric): Secondary | ICD-10-CM

## 2023-11-14 DIAGNOSIS — Z122 Encounter for screening for malignant neoplasm of respiratory organs: Secondary | ICD-10-CM

## 2023-11-14 MED ORDER — GABAPENTIN 300 MG PO CAPS
300.0000 mg | ORAL_CAPSULE | Freq: Three times a day (TID) | ORAL | 3 refills | Status: DC
Start: 2023-11-14 — End: 2024-05-03

## 2023-11-14 NOTE — Progress Notes (Signed)
 Established Patient Office Visit  Subjective   Patient ID: Timothy Nielsen, male    DOB: [birth year]  Age: 53 y.o. MRN: [id number]  Chief Complaint  Patient presents with   Hypertension   Gastroesophageal Reflux   [name] returns to care today for routine follow-up.  He was last evaluated by me on 1/23.  Lisinopril -HCTZ was reduced to 10-12.5 mg daily in the setting of orthostasis and hypotension.  No medication changes were made, refills provided, orthopedic surgery referral placed in the setting of chronic left knee pain, and 56-month follow-up arranged.  In the interim he has been evaluated by orthopedic surgery, pulmonology, and cardiology.  He underwent outpatient angiogram with successful PTA and stenting of the mid right SFA in the setting of restenosis.  There have otherwise been no acute interval events. Mr. Crunkleton reports feeling fairly well today.  He endorses neuropathic pain in both feet and would like to discuss increasing the dose or frequency of gabapentin .  He is otherwise asymptomatic and has no additional concerns currently.  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   Past Surgical History:  Procedure Laterality Date   ABDOMINAL AORTOGRAM W/LOWER EXTREMITY Bilateral 10/29/2021   Procedure: ABDOMINAL AORTOGRAM W/LOWER EXTREMITY;  Surgeon: Avanell Leigh, MD;  Location: [facility] INVASIVE CV LAB;  Service: Cardiovascular;  Laterality: Bilateral;   APPENDECTOMY     CATARACT EXTRACTION Left 07/26/2022   CORONARY PRESSURE/FFR STUDY N/A 04/14/2023   Procedure: CORONARY PRESSURE/FFR STUDY;  Surgeon: Kyra Phy, MD;  Location: [facility] INV[facility] CV [facility];  Service: Cardiovascular;  Laterality: N/A;   FRACTURE SURGERY Right    Right knee- age 67   LEFT HEART CATH AND CORONARY ANGIOGRAPHY  N/A 04/14/2023   Procedure: LEFT HEART CATH AND CORONARY ANGIOGRAPHY;  Surgeon: Kyra Phy, MD;  Location: [facility]VASIVE CV [facility];  Service: Cardiovascular;  Laterality: N/A;   LOWER EXTREMITY ANGIOGRAPHY N/A 10/02/2023   Procedure: Lower Extremity Angiography;  Surgeon: Avanell Leigh, MD;  Location[facility] INVASIVE CV LAB;  Service: Cardiovascular;  Laterality: N/A;   LOWER EXTREMITY INTERVENTION  10/02/2023   Procedure: LOWER EXTREMITY INTERVENTION;  Surgeon: Avanell Leigh, MD;  Location: [facility]VAS[facility];  Service: Cardiovascular;;   PERIPHERAL VASCULAR ATHERECTOMY  10/29/2021   Procedure: PERIPHERAL VASCULAR ATHERECTOMY;  Surgeon: Avanell Leigh, MD;  Location: [facility] INVASIVE CV LAB;  Service: Cardiovascular;;  Rt. SFA   Social History   Tobacco Use   Smoking status: Former    Current packs/day: 0.00    Average packs/day: 1 pack/day for 37.0 years (37.0 ttl pk-yrs)    Types: Cigarettes    Start date: 11/14/1986    Quit date: 07/22/2022    Years since quitting: 1.3   Smokeless tobacco: Never  Vaping Use   Vaping status: Never Used  Substance Use Topics   Alcohol use: Yes    Alcohol/week: 18.0 standard drinks of alcohol    Types: 18 Cans of beer per week    Comment: 12 pack a week   Drug use: No   Family History  Problem Relation Age of Onset   Diabetes Mother    Hypertension Mother    Alzheimer's disease Mother    Diabetes Father    Emphysema Father  No Known Allergies  Review of Systems  Constitutional:  Negative for chills and fever.  HENT:  Negative for sore throat.   Respiratory:  Negative for cough and shortness of breath.   Cardiovascular:  Negative for chest pain, palpitations and leg swelling.  Gastrointestinal:  Negative for abdominal pain, blood in stool, constipation, diarrhea, nausea and vomiting.  Genitourinary:  Negative for dysuria and hematuria.  Musculoskeletal:  Negative for myalgias.  Skin:  Negative for itching and rash.  Neurological:   Positive for tingling (bilateral feet). Negative for dizziness and headaches.  Psychiatric/Behavioral:  Negative for depression and suicidal ideas.      Objective:     BP 108/71   Pulse 79   Ht 6\' 6"  (1.981 m)   Wt 268 lb (121.6 kg)   SpO2 94%   BMI 30.97 kg/m  BP Readings from Last 3 Encounters:  11/14/23 108/71  11/03/23 120/81  10/28/23 (!) 92/50   Physical Exam Vitals reviewed.  Constitutional:      General: He is not in acute distress.    Appearance: Normal appearance. He is not ill-appearing.  HENT:     Head: Normocephalic and atraumatic.     Right Ear: External ear normal.     Left Ear: External ear normal.     Nose: Nose normal. No congestion or rhinorrhea.     Mouth/Throat:     Mouth: Mucous membranes are moist.     Pharynx: Oropharynx is clear.  Eyes:     General: No scleral icterus.    Extraocular Movements: Extraocular movements intact.     Conjunctiva/sclera: Conjunctivae normal.     Pupils: Pupils are equal, round, and reactive to light.  Cardiovascular:     Rate and Rhythm: Normal rate and regular rhythm.     Pulses: Normal pulses.     Heart sounds: Normal heart sounds. No murmur heard. Pulmonary:     Effort: Pulmonary effort is normal.     Breath sounds: Normal breath sounds. No wheezing, rhonchi or rales.  Abdominal:     General: Abdomen is flat. Bowel sounds are normal. There is no distension.     Palpations: Abdomen is soft.     Tenderness: There is no abdominal tenderness.  Musculoskeletal:        General: No swelling or deformity. Normal range of motion.     Cervical back: Normal range of motion.  Skin:    General: Skin is warm and dry.     Capillary Refill: Capillary refill takes less than 2 seconds.  Neurological:     General: No focal deficit present.     Mental Status: He is alert and oriented to person, place, and time.     Motor: No weakness.     Gait: Gait abnormal (Ambulates with cane).  Psychiatric:        Mood and Affect: Mood  normal.        Behavior: Behavior normal.        Thought Content: Thought content normal.   Last CBC Lab Results  Component Value Date   WBC 8.3 10/03/2023   HGB 12.0 (L) 10/03/2023   HCT 35.3 (L) 10/03/2023   MCV 91.0 10/03/2023   MCH 30.9 10/03/2023   RDW 13.1 10/03/2023   PLT 198 10/03/2023   Last metabolic panel Lab Results  Component Value Date   GLUCOSE 112 (H) 10/03/2023   NA 132 (L) 10/03/2023   K 3.5 10/03/2023   CL 98 10/03/2023   CO2 27  10/03/2023   BUN 9 10/03/2023   CREATININE 0.97 10/03/2023   GFRNONAA >60 10/03/2023   CALCIUM  8.7 (L) 10/03/2023   PROT 7.2 02/15/2022   ALBUMIN 4.2 02/15/2022   LABGLOB 2.5 08/18/2020   AGRATIO 1.9 08/18/2020   BILITOT 1.0 02/15/2022   ALKPHOS 105 02/15/2022   AST 29 02/15/2022   ALT 53 (H) 02/15/2022   ANIONGAP 7 10/03/2023   Last lipids Lab Results  Component Value Date   CHOL 81 10/03/2023   HDL 27 (L) 10/03/2023   LDLCALC 29 10/03/2023   TRIG 124 10/03/2023   CHOLHDL 3.0 10/03/2023   Last hemoglobin A1c Lab Results  Component Value Date   HGBA1C 5.9 (H) 07/02/2022   Last thyroid  functions Lab Results  Component Value Date   TSH 1.550 05/20/2023   T4TOTAL 5.2 05/20/2023   Last vitamin D  Lab Results  Component Value Date   VD25OH 45.5 11/13/2022   Last vitamin B12 and Folate Lab Results  Component Value Date   VITAMINB12 791 07/02/2022   FOLATE 9.4 07/02/2022     Assessment & Plan:   Problem List Items Addressed This Visit       Essential hypertension   Lisinopril -HCTZ was reduced to 10-12.5 mg daily at his last appointment.  He is additionally prescribed metoprolol  tartrate 25 mg twice daily.  BP today is 108/71.  No additional medication changes are indicated at this time.      COPD with asthma (HCC)   Asymptomatic currently.  Pulmonary exam is unremarkable.  He continues to use Symbicort  for maintenance and albuterol  as needed, which is infrequent.  Former tobacco use, quit in January  2024.  He is due for repeat lung cancer screening.  Referral to lung cancer screening program placed today.      OSA (obstructive sleep apnea)   Documented history of severe OSA.  Followed closely by pulmonology and is working to find a tolerable CPAP mask and settings.  He states that he will follow-up next week.  They have also discussed his potential candidacy for inspire.      Peripheral neuropathy   Bilateral lower extremity peripheral neuropathy secondary to PVD.  He is currently prescribed gabapentin  300 mg twice daily.  He continues to endorse paresthesias in both feet and would like to discuss increasing the dose or frequency of gabapentin .  Through shared decision making, gabapentin  has been increased to 300 mg 3 times daily.  He will return to care if symptoms worsen or fail to improve.      Return in about 6 months (around 05/16/2024).   Tobi Fortes, MD

## 2023-11-14 NOTE — Telephone Encounter (Signed)
 Copied from CRM [id number]. Topic: Clinical - Medical Advice >> Nov 14, 2023  1:10 PM Hilton Lucky wrote: Reason for CRM: Patient is calling back regarding Inspire information. Please return call when able.  I called and spoke to pt. Pt states Tammy mentioned the Inspire and asked pt about it, but Tammy wanted pt to try a different CPAP mask first. Pt states that the CPAP mask is no better and is ready to proceed with Inspire. Pt was last seen by Roena Clark, NP on 11-03-23. Please advise.

## 2023-11-14 NOTE — Assessment & Plan Note (Signed)
 Asymptomatic currently.  Pulmonary exam is unremarkable.  He continues to use Symbicort  for maintenance and albuterol  as needed, which is infrequent.  Former tobacco use, quit in January 2024.  He is due for repeat lung cancer screening.  Referral to lung cancer screening program placed today.

## 2023-11-14 NOTE — Telephone Encounter (Signed)
 Lung Cancer Screening Narrative/Criteria Questionnaire (Cigarette Smokers Only- No Cigars/Pipes/vapes)   Timothy Nielsen   SDMV:12/01/2023 11:00 [name]      [birth year]   LDCT: 12/09/2023 10:30 AP    53 y.o.   Phone: [phone]  Lung Screening Narrative (confirm age 53-77 yrs Medicare / 50-80 yrs Private pay insurance)   Solicitor and mcd   Referring Provider:Dr. Kermit Ped   This screening involves an initial phone call with a team member from our program. It is called a shared decision making visit. The initial meeting is required by  insurance and Medicare to make sure you understand the program. This appointment takes about 15-20 minutes to complete. You will complete the screening scan at your scheduled date/time.  This scan takes about 5-10 minutes to complete. You can eat and drink normally before and after the scan.  Criteria questions for Lung Cancer Screening:   Are you a current or former smoker? Former Age began smoking: 53yo   If you are a former smoker, what year did you quit smoking? 06/2023(within 15 yrs)   To calculate your smoking history, I need an accurate estimate of how many packs of cigarettes you smoked per day and for how many years. (Not just the number of PPD you are now smoking)   Years smoking 39 x Packs per day 1 = Pack years 39   (at least 20 pack yrs)   (Make sure they understand that we need to know how much they have smoked in the past, not just the number of PPD they are smoking now)  Do you have a personal history of cancer?  No    Do you have a family history of cancer? Yes  (cancer type and and relative) father - lung  Are you coughing up blood?  No  Have you had unexplained weight loss of 15 lbs or more in the last 6 months? No  It looks like you meet all criteria.  When would be a good time for us  to schedule you for this screening?   Additional information: N/A

## 2023-11-14 NOTE — Assessment & Plan Note (Signed)
 Lisinopril -HCTZ was reduced to 10-12.5 mg daily at his last appointment.  He is additionally prescribed metoprolol  tartrate 25 mg twice daily.  BP today is 108/71.  No additional medication changes are indicated at this time.

## 2023-11-14 NOTE — Telephone Encounter (Signed)
 ATC X2. Atc but want straight to vm. LMTCB. Sending a Mychart message and then completing note per protocol. NFN

## 2023-11-14 NOTE — Assessment & Plan Note (Signed)
 Documented history of severe OSA.  Followed closely by pulmonology and is working to find a tolerable CPAP mask and settings.  He states that he will follow-up next week.  They have also discussed his potential candidacy for inspire.

## 2023-11-14 NOTE — Assessment & Plan Note (Signed)
 Bilateral lower extremity peripheral neuropathy secondary to PVD.  He is currently prescribed gabapentin  300 mg twice daily.  He continues to endorse paresthesias in both feet and would like to discuss increasing the dose or frequency of gabapentin .  Through shared decision making, gabapentin  has been increased to 300 mg 3 times daily.  He will return to care if symptoms worsen or fail to improve.

## 2023-11-14 NOTE — Patient Instructions (Signed)
 It was a pleasure to see you today.  Thank you for giving us  the opportunity to be involved in your care.  Below is a brief recap of your visit and next steps.  We will plan to see you again in 6 months.  Summary No medication changes today OK to increase frequency of gabapentin  to three times daily Follow up in 6 months

## 2023-11-18 ENCOUNTER — Other Ambulatory Visit: Payer: Self-pay | Admitting: Internal Medicine

## 2023-11-18 DIAGNOSIS — E559 Vitamin D deficiency, unspecified: Secondary | ICD-10-CM

## 2023-11-24 ENCOUNTER — Encounter (HOSPITAL_COMMUNITY)

## 2023-11-25 ENCOUNTER — Other Ambulatory Visit: Payer: Self-pay | Admitting: Internal Medicine

## 2023-11-25 DIAGNOSIS — G629 Polyneuropathy, unspecified: Secondary | ICD-10-CM

## 2023-12-01 ENCOUNTER — Encounter: Payer: Self-pay | Admitting: Physician Assistant

## 2023-12-01 ENCOUNTER — Ambulatory Visit: Admitting: Physician Assistant

## 2023-12-01 DIAGNOSIS — Z87891 Personal history of nicotine dependence: Secondary | ICD-10-CM

## 2023-12-01 NOTE — Patient Instructions (Signed)

## 2023-12-01 NOTE — Progress Notes (Signed)
 Virtual Visit via Telephone Note  I connected with Timothy Nielsen on 12/01/23 at 11:13 AM by telephone and verified that I am speaking with the correct person using two identifiers.  Location: Patient: home Provider: working virtually from home   I discussed the limitations, risks, security and privacy concerns of performing an evaluation and management service by telephone and the availability of in person appointments. I also discussed with the patient that there may be a patient responsible charge related to this service. The patient expressed understanding and agreed to proceed.       Shared Decision Making Visit Lung Cancer Screening Program [phone])   Eligibility: Age 53 Pack Years Smoking History Calculation 67 (# packs/per year x # years smoked) Recent History of coughing up blood  no Unexplained weight loss? No ( >Than 15 pounds within the last 6 months ) Prior History Lung / other cancer No (Diagnosis within the last 5 years already requiring surveillance chest CT Scans). Smoking Status Former Smoker Former Smokers: Years since quit: < 1 year  Quit Date: 2025  Visit Components: Discussion included one or more decision making aids? Yes Discussion included risk/benefits of screening. Yes Discussion included potential follow up diagnostic testing for abnormal scans. Yes Discussion included meaning and risk of over diagnosis. Yes Discussion included meaning and risk of False Positives. Yes Discussion included meaning of total radiation exposure. Yes  Counseling Included: Importance of adherence to annual lung cancer LDCT screening. Yes Impact of comorbidities on ability to participate in the program. Yes Ability and willingness to under diagnostic treatment. Yes  Smoking Cessation Counseling: Former Smokers:  Discussed the importance of maintaining cigarette abstinence. Yes Diagnosis Code: Personal History of Nicotine  Dependence. U98.119 Information about tobacco  cessation classes and interventions provided to patient. Yes Written Order for Lung Cancer Screening with LDCT placed in Epic. Yes (CT Chest Lung Cancer Screening Low Dose W/O CM) [id number] Z12.2-Screening of respiratory organs Z87.891-Personal history of nicotine  dependence    I spent 25 minutes of face to face time/virtual visit time  with the patient discussing the risks and benefits of lung cancer screening. We took the time to pause at intervals to allow for questions to be asked and answered to ensure understanding. We discussed that they had taken the single most powerful action possible to decrease their risk of developing lung cancer when they quit smoking. I counseled them to remain smoke free, and to contact the office if they ever had the desire to smoke again so that we can provide resources and tools to help support the effort to remain smoke free. We discussed the time and location of the scan, and they  will receive a call or letter with the results within  24-72 hours of receiving them. They have the office contact information in the event they have questions.   They verbalized understanding of all of the above and had no further questions.    I explained to the patient that there has been a high incidence of coronary artery disease noted on these exams. I explained that this is a non-gated exam therefore degree or severity cannot be determined. This patient is not on statin therapy. I have asked the patient to follow-up with their PCP regarding any incidental finding of coronary artery disease and management with diet or medication as they feel is clinically indicated. The patient verbalized understanding of the above and had no further questions.      [name] [name], PA-C

## 2023-12-03 ENCOUNTER — Other Ambulatory Visit: Payer: Self-pay | Admitting: Internal Medicine

## 2023-12-03 DIAGNOSIS — I739 Peripheral vascular disease, unspecified: Secondary | ICD-10-CM

## 2023-12-09 ENCOUNTER — Ambulatory Visit (HOSPITAL_COMMUNITY)
Admission: RE | Admit: 2023-12-09 | Discharge: 2023-12-09 | Disposition: A | Discharge status: Home / Self Care | Source: Ambulatory Visit | Attending: Acute Care | Admitting: Acute Care

## 2023-12-09 DIAGNOSIS — Z122 Encounter for screening for malignant neoplasm of respiratory organs: Secondary | ICD-10-CM | POA: Diagnosis present

## 2023-12-09 DIAGNOSIS — Z87891 Personal history of nicotine dependence: Secondary | ICD-10-CM | POA: Diagnosis present

## 2023-12-09 DIAGNOSIS — F17210 Nicotine dependence, cigarettes, uncomplicated: Secondary | ICD-10-CM | POA: Insufficient documentation

## 2023-12-17 ENCOUNTER — Other Ambulatory Visit: Payer: Self-pay | Admitting: Internal Medicine

## 2023-12-22 ENCOUNTER — Other Ambulatory Visit: Payer: Self-pay

## 2023-12-22 DIAGNOSIS — Z87891 Personal history of nicotine dependence: Secondary | ICD-10-CM

## 2023-12-22 DIAGNOSIS — Z122 Encounter for screening for malignant neoplasm of respiratory organs: Secondary | ICD-10-CM

## 2023-12-23 ENCOUNTER — Telehealth (HOSPITAL_BASED_OUTPATIENT_CLINIC_OR_DEPARTMENT_OTHER): Payer: Self-pay

## 2023-12-23 NOTE — Telephone Encounter (Signed)
 Copied from CRM [id number]. Topic: Clinical - Lab/Test Results >> Dec 23, 2023  2:30 PM Timothy Nielsen wrote: Pt is calling to follow up on his lung screening results. Please give the pt a call to go over those results. He states he  had a CT done on 06/17

## 2023-12-23 NOTE — Telephone Encounter (Signed)
 Called patient and verified, using two patient identifiers, to review results of recent LDCT.  No suspicious findings for lung cancer.  Small pulmonary nodules with no concern for cancer at this time.  Atherosclerosis and emphysema as previously noted.  Patient states he is on statin medication. Plan to schedule next year for repeat scan.  Patient had no questions and verbalized understanding of review of results.

## 2023-12-23 NOTE — Telephone Encounter (Signed)
**Note De-identified  [place] Obfuscation** Please advise 

## 2024-01-05 ENCOUNTER — Other Ambulatory Visit: Payer: Self-pay | Admitting: Internal Medicine

## 2024-01-12 ENCOUNTER — Telehealth (HOSPITAL_BASED_OUTPATIENT_CLINIC_OR_DEPARTMENT_OTHER): Payer: Self-pay

## 2024-01-12 NOTE — Telephone Encounter (Signed)
Can you schedule

## 2024-01-12 NOTE — Telephone Encounter (Signed)
 Copied from CRM [id number]. Topic: Clinical - Medical Advice >> Jan 09, 2024 12:41 PM Rilla B wrote: Reason for CRM: Patient states the mask are not working for him and he is interested in the inspire devise.  He has questions.  Please cal patient.

## 2024-01-12 NOTE — Telephone Encounter (Signed)
 Patient scheduled 7/23 with Dr Jude. Nothing further needed.

## 2024-01-14 ENCOUNTER — Encounter: Payer: Self-pay | Admitting: Pulmonary Disease

## 2024-01-14 ENCOUNTER — Encounter (HOSPITAL_BASED_OUTPATIENT_CLINIC_OR_DEPARTMENT_OTHER): Payer: Self-pay | Admitting: Pulmonary Disease

## 2024-01-14 ENCOUNTER — Telehealth: Payer: Self-pay | Admitting: Pulmonary Disease

## 2024-01-14 ENCOUNTER — Ambulatory Visit (INDEPENDENT_AMBULATORY_CARE_PROVIDER_SITE_OTHER): Admitting: Pulmonary Disease

## 2024-01-14 VITALS — BP 132/70 | HR 83 | Temp 98.5°F | Ht 78.0 in | Wt 268.4 lb

## 2024-01-14 DIAGNOSIS — Z87891 Personal history of nicotine dependence: Secondary | ICD-10-CM

## 2024-01-14 DIAGNOSIS — G4733 Obstructive sleep apnea (adult) (pediatric): Secondary | ICD-10-CM

## 2024-01-14 NOTE — Progress Notes (Signed)
   Subjective:    Patient ID: Timothy Nielsen, male    DOB: 1971/05/21, 53 y.o.   MRN: [id number]  HPI   FU of severe OSA & mild COPD/ asthma  Discussed the use of AI scribe software for clinical note transcription with the patient, who gave verbal consent to proceed.  History of Present Illness Timothy Nielsen is a 53 year old male with severe obstructive sleep apnea who presents with difficulty tolerating CPAP therapy.  He experiences a sense of suffocation and paranoia with CPAP therapy, despite trying multiple masks. This has led to inconsistent use and consideration of returning the device. His sleep quality remains poor, with increased fatigue and non-restorative sleep. A recent sleep study indicates 36 apneic episodes per hour. He experiences loud snoring, which has been disruptive to others.  He stays indoors due to heat and humidity, which worsen his breathing difficulties. He feels only moderately well regarding his breathing.    PMH : CAD s/p angioplasty 10/2021  He smoked 37 pack years before he quit 06/2022  Significant tests/ events reviewed  07/2023 HST th AHI 37.2/hour and SpO2 low to 81%    LDCT chest 11/2023 RADS 2, stable 4mm nodule  LDCT 08/2022 3 mm nodule left lower lobe, mild emphysema, three-vessel CAD   PFTs 08/2022 mild COPD/asthma ratio 67, FEV1 67%, FVC 77%, FEV1 improved to 76% x 12% postbronchodilator, TLC 1 1 7%, DLCO normal  Review of Systems neg for any significant sore throat, dysphagia, itching, sneezing, nasal congestion or excess/ purulent secretions, fever, chills, sweats, unintended wt loss, pleuritic or exertional cp, hempoptysis, orthopnea pnd or change in chronic leg swelling. Also denies presyncope, palpitations, heartburn, abdominal pain, nausea, vomiting, diarrhea or change in bowel or urinary habits, dysuria,hematuria, rash, arthralgias, visual complaints, headache, numbness weakness or ataxia.     Objective:   Physical Exam  Gen. Pleasant,  well-nourished, tall,in no distress ENT - no thrush, no pallor/icterus,no post nasal drip Neck: No JVD, no thyromegaly, no carotid bruits Lungs: no use of accessory muscles, no dullness to percussion, clear without rales or rhonchi  Cardiovascular: Rhythm regular, heart sounds  normal, no murmurs or gallops, no peripheral edema Musculoskeletal: No deformities, no cyanosis or clubbing        Assessment & Plan:   Assessment and Plan Assessment & Plan Severe Obstructive Sleep Apnea (OSA) Severe obstructive sleep apnea with an apnea-hypopnea index of 36 events per hour, indicating significant airway obstruction during sleep. CPAP therapy is not tolerated due to feelings of suffocation and paranoia. Symptoms include loud snoring and excessive daytime fatigue, impacting daily activities. He is interested in pursuing Inspire therapy, which involves hypoglossal nerve stimulation to prevent airway collapse by moving the tongue forward. He meets the criteria for Inspire therapy due to the severity of OSA, inability to tolerate CPAP, and appropriate weight range. - Schedule drug-induced sleep endoscopy to assess airway collapse pattern, tentatively on August 19th. - Ensure he has a driver for the endoscopy procedure due to sedation. - Refer to ENT surgeon for [facility] implant evaluation post-endoscopy. - Coordinate with insurance for approval of Inspire therapy. - Advise him to return CPAP machine if not in use.   COPD - continue symbicort

## 2024-01-14 NOTE — Telephone Encounter (Signed)
 I scheduled the following: Please let pt know  Provider performing procedure:Didi Ganaway Diagnosis: OSA Procedure: DISE  Has patient been spoken to by Provider and given informed consent? Y Anesthesia: propofol Date: 8/19 Alternate Date:   Time: AM8-30 [facility] Does patient have Latex allergy? NA Medication Restriction/ Anticoagulate/Antiplatelet: NA Is patient on GLP-1 agonist? NA, must hold for procedure. Pre-op Labs Ordered:determined by Anesthesia  Please coordinate Pre-op COVID Testing

## 2024-01-14 NOTE — H&P (View-Only) (Signed)
   Subjective:    Patient ID: Timothy Nielsen, male    DOB: 1971/05/21, 53 y.o.   MRN: [id number]  HPI   FU of severe OSA & mild COPD/ asthma  Discussed the use of AI scribe software for clinical note transcription with the patient, who gave verbal consent to proceed.  History of Present Illness Timothy Nielsen is a 53 year old male with severe obstructive sleep apnea who presents with difficulty tolerating CPAP therapy.  He experiences a sense of suffocation and paranoia with CPAP therapy, despite trying multiple masks. This has led to inconsistent use and consideration of returning the device. His sleep quality remains poor, with increased fatigue and non-restorative sleep. A recent sleep study indicates 36 apneic episodes per hour. He experiences loud snoring, which has been disruptive to others.  He stays indoors due to heat and humidity, which worsen his breathing difficulties. He feels only moderately well regarding his breathing.    PMH : CAD s/p angioplasty 10/2021  He smoked 37 pack years before he quit 06/2022  Significant tests/ events reviewed  07/2023 HST th AHI 37.2/hour and SpO2 low to 81%    LDCT chest 11/2023 RADS 2, stable 4mm nodule  LDCT 08/2022 3 mm nodule left lower lobe, mild emphysema, three-vessel CAD   PFTs 08/2022 mild COPD/asthma ratio 67, FEV1 67%, FVC 77%, FEV1 improved to 76% x 12% postbronchodilator, TLC 1 1 7%, DLCO normal  Review of Systems neg for any significant sore throat, dysphagia, itching, sneezing, nasal congestion or excess/ purulent secretions, fever, chills, sweats, unintended wt loss, pleuritic or exertional cp, hempoptysis, orthopnea pnd or change in chronic leg swelling. Also denies presyncope, palpitations, heartburn, abdominal pain, nausea, vomiting, diarrhea or change in bowel or urinary habits, dysuria,hematuria, rash, arthralgias, visual complaints, headache, numbness weakness or ataxia.     Objective:   Physical Exam  Gen. Pleasant,  well-nourished, tall,in no distress ENT - no thrush, no pallor/icterus,no post nasal drip Neck: No JVD, no thyromegaly, no carotid bruits Lungs: no use of accessory muscles, no dullness to percussion, clear without rales or rhonchi  Cardiovascular: Rhythm regular, heart sounds  normal, no murmurs or gallops, no peripheral edema Musculoskeletal: No deformities, no cyanosis or clubbing        Assessment & Plan:   Assessment and Plan Assessment & Plan Severe Obstructive Sleep Apnea (OSA) Severe obstructive sleep apnea with an apnea-hypopnea index of 36 events per hour, indicating significant airway obstruction during sleep. CPAP therapy is not tolerated due to feelings of suffocation and paranoia. Symptoms include loud snoring and excessive daytime fatigue, impacting daily activities. He is interested in pursuing Inspire therapy, which involves hypoglossal nerve stimulation to prevent airway collapse by moving the tongue forward. He meets the criteria for Inspire therapy due to the severity of OSA, inability to tolerate CPAP, and appropriate weight range. - Schedule drug-induced sleep endoscopy to assess airway collapse pattern, tentatively on August 19th. - Ensure he has a driver for the endoscopy procedure due to sedation. - Refer to ENT surgeon for [facility] implant evaluation post-endoscopy. - Coordinate with insurance for approval of Inspire therapy. - Advise him to return CPAP machine if not in use.   COPD - continue symbicort

## 2024-01-14 NOTE — Patient Instructions (Signed)
 We discussed inspire Endoscopy tentatively scheduled for aug 19 th Tuesday  We will then refer you to ENT  FOllow up 1 month after implantation of device

## 2024-01-15 NOTE — Telephone Encounter (Signed)
Called pt and he is aware of appt

## 2024-02-09 NOTE — Anesthesia Preprocedure Evaluation (Signed)
 Anesthesia Evaluation  Patient identified by MRN, date of birth, ID band Patient awake    Reviewed: Allergy & Precautions, NPO status , Patient's Chart, lab work & pertinent test results  History of Anesthesia Complications Negative for: history of anesthetic complications  Airway Mallampati: III  TM Distance: >3 FB Neck ROM: Full    Dental  (+) Dental Advisory Given, Missing, Chipped   Pulmonary neg shortness of breath, asthma , sleep apnea , COPD,  COPD inhaler, neg recent URI, former smoker   Pulmonary exam normal breath sounds clear to auscultation       Cardiovascular hypertension (lisinopril -HCTZ, metoprolol ), Pt. on medications and Pt. on home beta blockers (-) angina + CAD and + Peripheral Vascular Disease  (-) Past MI, (-) Cardiac Stents and (-) CABG (-) dysrhythmias  Rhythm:Regular Rate:Normal  HLD  TTE 06/03/2023: IMPRESSIONS    1. Left ventricular ejection fraction, by estimation, is 50 to 55%. The  left ventricle has low normal function. The left ventricle has no regional  wall motion abnormalities. There is mild left ventricular hypertrophy.  Left ventricular diastolic  parameters are consistent with Grade I diastolic dysfunction (impaired  relaxation).   2. Right ventricular systolic function is normal. The right ventricular  size is normal. Tricuspid regurgitation signal is inadequate for assessing  PA pressure.   3. The mitral valve is normal in structure. No evidence of mitral valve  regurgitation. No evidence of mitral stenosis.   4. The aortic valve is tricuspid. Aortic valve regurgitation is not  visualized. No aortic stenosis is present.   5. Aortic dilatation noted. 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   6. The inferior vena cava is normal in size with greater than 50%  respiratory variability, suggesting right atrial pressure of 3 mmHg.   LHC 04/14/2023:   Prox RCA  lesion is 50% stenosed.   RPAV lesion is 20% stenosed.   RPDA lesion is 20% stenosed.   Mid RCA lesion is 20% stenosed.   1.  Moderate right coronary artery disease with RFR of 0.98 and FFR of 0.93; intervention was therefore deferred. 2.  Assessment of coronary microvascular dysfunction was performed which demonstrated a CFR of 3.1 and an index of microvascular resistance of 6 indicating no evidence of coronary microvascular dysfunction. 3.  Highly elevated LVEDP of 32 mmHg     Neuro/Psych neg Seizures PSYCHIATRIC DISORDERS Anxiety Depression    Sydenham's chorea  Neuromuscular disease (peripheral neuropathy)    GI/Hepatic ,GERD  Medicated,,(+)     substance abuse  alcohol use  Endo/Other  negative endocrine ROS    Renal/GU negative Renal ROS     Musculoskeletal  (+) Arthritis , Osteoarthritis,    Abdominal  (+) + obese  Peds  Hematology  (+) Blood dyscrasia, anemia Lab Results      Component                Value               Date                      WBC                      8.3                 10/03/2023                HGB  12.0 (L)            10/03/2023                HCT                      35.3 (L)            10/03/2023                MCV                      91.0                10/03/2023                PLT                      198                 10/03/2023              Anesthesia Other Findings Last Plavix : this morning  Reproductive/Obstetrics                              Anesthesia Physical Anesthesia Plan  ASA: 3  Anesthesia Plan: MAC   Post-op Pain Management: Minimal or no pain anticipated   Induction: Intravenous  PONV Risk Score and Plan: 1 and Propofol  infusion, TIVA, Midazolam  and Treatment may vary due to age or medical condition  Airway Management Planned: Natural Airway and Nasal Cannula  Additional Equipment:   Intra-op Plan:   Post-operative Plan:   Informed Consent: I have  reviewed the patients History and Physical, chart, labs and discussed the procedure including the risks, benefits and alternatives for the proposed anesthesia with the patient or authorized representative who has indicated his/her understanding and acceptance.     Dental advisory given  Plan Discussed with: Anesthesiologist and CRNA  Anesthesia Plan Comments: (Discussed with patient risks of MAC including, but not limited to, minor pain or discomfort, hearing people in the room, and possible need for backup general anesthesia. Risks for general anesthesia also discussed including, but not limited to, sore throat, hoarse voice, chipped/damaged teeth, injury to vocal cords, nausea and vomiting, allergic reactions, lung infection, heart attack, stroke, and death. All questions answered. )         Anesthesia Quick Evaluation

## 2024-02-10 ENCOUNTER — Ambulatory Visit (HOSPITAL_COMMUNITY): Payer: Self-pay | Admitting: Anesthesiology

## 2024-02-10 ENCOUNTER — Telehealth: Payer: Self-pay | Admitting: Pulmonary Disease

## 2024-02-10 ENCOUNTER — Other Ambulatory Visit: Payer: Self-pay

## 2024-02-10 ENCOUNTER — Encounter (HOSPITAL_COMMUNITY)
Admission: RE | Disposition: A | Discharge status: Home / Self Care | Payer: Self-pay | Source: Home / Self Care | Attending: Pulmonary Disease

## 2024-02-10 ENCOUNTER — Ambulatory Visit (HOSPITAL_COMMUNITY)
Admission: RE | Admit: 2024-02-10 | Discharge: 2024-02-10 | Disposition: A | Discharge status: Home / Self Care | Attending: Pulmonary Disease | Admitting: Pulmonary Disease

## 2024-02-10 DIAGNOSIS — Z87891 Personal history of nicotine dependence: Secondary | ICD-10-CM | POA: Insufficient documentation

## 2024-02-10 DIAGNOSIS — G4733 Obstructive sleep apnea (adult) (pediatric): Secondary | ICD-10-CM

## 2024-02-10 DIAGNOSIS — Z79899 Other long term (current) drug therapy: Secondary | ICD-10-CM | POA: Insufficient documentation

## 2024-02-10 DIAGNOSIS — K219 Gastro-esophageal reflux disease without esophagitis: Secondary | ICD-10-CM | POA: Insufficient documentation

## 2024-02-10 DIAGNOSIS — J439 Emphysema, unspecified: Secondary | ICD-10-CM | POA: Insufficient documentation

## 2024-02-10 DIAGNOSIS — J45909 Unspecified asthma, uncomplicated: Secondary | ICD-10-CM | POA: Diagnosis not present

## 2024-02-10 DIAGNOSIS — I2510 Atherosclerotic heart disease of native coronary artery without angina pectoris: Secondary | ICD-10-CM | POA: Diagnosis not present

## 2024-02-10 DIAGNOSIS — I739 Peripheral vascular disease, unspecified: Secondary | ICD-10-CM | POA: Insufficient documentation

## 2024-02-10 DIAGNOSIS — I10 Essential (primary) hypertension: Secondary | ICD-10-CM | POA: Diagnosis not present

## 2024-02-10 HISTORY — PX: DRUG INDUCED ENDOSCOPY: SHX6808

## 2024-02-10 SURGERY — DRUG INDUCED SLEEP ENDOSCOPY
Anesthesia: Monitor Anesthesia Care

## 2024-02-10 MED ORDER — FENTANYL CITRATE (PF) 100 MCG/2ML IJ SOLN: 25.0000 ug | INTRAMUSCULAR | Status: DC | PRN

## 2024-02-10 MED ORDER — GLYCOPYRROLATE PF 0.2 MG/ML IJ SOSY
PREFILLED_SYRINGE | INTRAMUSCULAR | Status: DC | PRN
  Administered 2024-02-10: .1 mg via INTRAVENOUS

## 2024-02-10 MED ORDER — OXYMETAZOLINE HCL 0.05 % NA SOLN
NASAL | Status: AC
  Filled 2024-02-10: qty 15

## 2024-02-10 MED ORDER — PHENYLEPHRINE HCL-NACL 20-0.9 MG/250ML-% IV SOLN
INTRAVENOUS | Status: AC
Start: 2024-02-10 — End: 2024-02-10
  Filled 2024-02-10: qty 500

## 2024-02-10 MED ORDER — LIDOCAINE 2% (20 MG/ML) 5 ML SYRINGE
INTRAMUSCULAR | Status: DC | PRN
  Administered 2024-02-10: 50 mg via INTRAVENOUS

## 2024-02-10 MED ORDER — SODIUM CHLORIDE 0.9 % IV SOLN
INTRAVENOUS | Status: AC | PRN
  Administered 2024-02-10: 250 mL via INTRAMUSCULAR

## 2024-02-10 MED ORDER — OXYMETAZOLINE HCL 0.05 % NA SOLN
NASAL | Status: DC | PRN
  Administered 2024-02-10: 1

## 2024-02-10 MED ORDER — PROPOFOL 500 MG/50ML IV EMUL
INTRAVENOUS | Status: DC | PRN
Start: 2024-02-10 — End: 2024-02-10
  Administered 2024-02-10: 100 ug/kg/min via INTRAVENOUS

## 2024-02-10 MED ORDER — PROPOFOL 500 MG/50ML IV EMUL
INTRAVENOUS | Status: AC
Start: 2024-02-10 — End: 2024-02-10
  Filled 2024-02-10: qty 50

## 2024-02-10 MED ORDER — PROPOFOL 10 MG/ML IV BOLUS
INTRAVENOUS | Status: DC | PRN
  Administered 2024-02-10 (×2): 10 mg via INTRAVENOUS

## 2024-02-10 NOTE — Op Note (Signed)
 Procedure: Evaluation of sleep-disordered breathing by examination of upper airway using an endoscope  CPT Codes: 57024 Evaluation of sleep-disordered breathing by examination of upper airway using an endoscope  Pre-Op Diagnose: Severe obstructive sleep apnea with positive airway pressure intolerance (ICD-10 G47.33).  Post-Op Diagnosis: Severe obstructive sleep apnea with positive pressure airway intolerance (ICD-10 G47.33).  ANESTHESIA: IV sedation.  ESTIMATED BLOOD LOSS: None.  COMPLICATIONS: None.  BRIEF CLINICAL HISTORY: This is a 53 year old patient with a history of  severe symptomatic obstructive sleep apnea, who is intolerant and unable to achieve benefit with positive pressure therapy.He  presents today for drug-induced sleep endoscopy to better characterize her locations and pattern of obstruction and to predict appropriate medical and/or surgical options moving forward.  PROCEDURE FINDINGS: There was no evidence of complete concentric palatal obstruction and he is a candidate anatomically for hypoglossal nerve stimulation therapy.  DESCRIPTION OF PROCEDURE: The patient was brought to the endoscopy room and was anesthetized via the standard drug-induced sleep endoscopy protocol. The propofol  infusion rate was started at 75 mcg and gradually increased  to 175 mcg at which point, conditions that mimic sleep were gradually observed.   With the patient not responsive to verbal commands, but still with spontaneous respiration, sleep disordered breathing events and associated desaturations were clearly observed  Under these conditions, the flexible endoscope was inserted to examine both sides of the nose as well as the pharynx and larynx.  The VOTE score at baseline wasc omplete  AP , partial AP , partial AP, partial AP.  With simulated jaw advancement and tongue advancement, the hypopharyngeal obstruction and secondarily the palatal collapse also improved.  In summary, there was no  evidence of complete concentric palatal obstruction and he is a candidate anatomically for hypoglossal nerve stimulation therapy.  I was present for and performed the entire procedure.  Dictated By: Harden ROCKFORD Jude MD  Post-Op Plan: ENT evaluation for implantation  Diagnostic Codes: G47.33 Obstructive sleep apnea (adult)

## 2024-02-10 NOTE — Anesthesia Postprocedure Evaluation (Signed)
 Anesthesia Post Note  Patient: Timothy Nielsen  Procedure(s) Performed: DRUG INDUCED SLEEP ENDOSCOPY     Patient location during evaluation: PACU Anesthesia Type: MAC Level of consciousness: awake Pain management: pain level controlled Vital Signs Assessment: post-procedure vital signs reviewed and stable Respiratory status: spontaneous breathing, nonlabored ventilation and respiratory function stable Cardiovascular status: stable and blood pressure returned to baseline Postop Assessment: no apparent nausea or vomiting Anesthetic complications: no   No notable events documented.  Last Vitals:  Vitals:   02/10/24 0850 02/10/24 0900  BP: 111/76 116/77  Pulse: 75 67  Resp: 17 19  Temp:    SpO2: 96% 99%    Last Pain:  Vitals:   02/10/24 0900  TempSrc:   PainSc: 0-No pain                 Delon Aisha Arch

## 2024-02-10 NOTE — Interval H&P Note (Signed)
 History and Physical Interval Note:  02/10/2024 7:57 AM  Lynwood Cedar  has presented today for surgery, with the diagnosis of OSA.  The various methods of treatment have been discussed with the patient and family. After consideration of risks, benefits and other options for treatment, the patient has consented to  Procedure(s): DRUG INDUCED SLEEP ENDOSCOPY (N/A) as a surgical intervention.  The patient's history has been reviewed, patient examined, no change in status, stable for surgery.  I have reviewed the patient's chart and labs.  Questions were answered to the patient's satisfaction.     Harden ROCKFORD Daimien Patmon

## 2024-02-10 NOTE — Discharge Instructions (Signed)
 Referral to ENT for Inspire implantation Return 1 month after implantation for activation of device

## 2024-02-10 NOTE — Transfer of Care (Signed)
 Immediate Anesthesia Transfer of Care Note  Patient: Lynwood Cedar  Procedure(s) Performed: DRUG INDUCED SLEEP ENDOSCOPY  Patient Location: PACU  Anesthesia Type:MAC  Level of Consciousness: awake, alert , and oriented  Airway & Oxygen Therapy: Patient Spontanous Breathing and Patient connected to nasal cannula oxygen  Post-op Assessment: Report given to RN and Post -op Vital signs reviewed and stable  Post vital signs: Reviewed and stable  Last Vitals:  Vitals Value Taken Time  BP 108/70 02/10/24 08:40  Temp 36.3 C 02/10/24 08:39  Pulse 80 02/10/24 08:41  Resp 20 02/10/24 08:41  SpO2 97 % 02/10/24 08:41  Vitals shown include unfiled device data.  Last Pain:  Vitals:   02/10/24 0839  TempSrc: Temporal  PainSc:          Complications: No notable events documented.

## 2024-02-10 NOTE — Telephone Encounter (Signed)
 DISE completed, please refer to ENT for inspire - ordered [place], please inform rep & Please reschedule his FU appt with me to 49month after implantation as activation visit

## 2024-02-11 ENCOUNTER — Encounter (HOSPITAL_COMMUNITY): Payer: Self-pay | Admitting: Pulmonary Disease

## 2024-02-12 ENCOUNTER — Other Ambulatory Visit: Payer: Self-pay | Admitting: Internal Medicine

## 2024-02-12 DIAGNOSIS — E559 Vitamin D deficiency, unspecified: Secondary | ICD-10-CM

## 2024-02-16 NOTE — Telephone Encounter (Signed)
 Copied from CRM [id number]. Topic: Referral - Status >> Feb 16, 2024 12:01 PM Timothy Nielsen wrote: Reason for CRM: Patient is calling to request that he be called and scheduled for his ENT referral.  Called and spoke to patient to update on referral. Notified him that at this time I am unable to schedule with the ENT specialist, but his referral has been placed and they should be reaching out to him. I did clarify for him where this was sent and provided their office number of [phone].   Nothing further at this time.

## 2024-02-18 ENCOUNTER — Encounter (INDEPENDENT_AMBULATORY_CARE_PROVIDER_SITE_OTHER): Payer: Self-pay | Admitting: Otolaryngology

## 2024-02-18 ENCOUNTER — Ambulatory Visit (INDEPENDENT_AMBULATORY_CARE_PROVIDER_SITE_OTHER): Admitting: Otolaryngology

## 2024-02-18 VITALS — BP 107/66 | HR 85 | Wt 264.2 lb

## 2024-02-18 DIAGNOSIS — Z91198 Patient's noncompliance with other medical treatment and regimen for other reason: Secondary | ICD-10-CM

## 2024-02-18 DIAGNOSIS — G4733 Obstructive sleep apnea (adult) (pediatric): Secondary | ICD-10-CM | POA: Diagnosis not present

## 2024-02-18 DIAGNOSIS — Z789 Other specified health status: Secondary | ICD-10-CM

## 2024-02-18 NOTE — Progress Notes (Signed)
 ENT CONSULT:  Reason for Consult: OSA CPAP intolerance   HPI: Discussed the use of AI scribe software for clinical note transcription with the patient, who gave verbal consent to proceed.  History of Present Illness Timothy Nielsen is a 53 year old male who presents with CPAP intolerance.   He was diagnosed with severe sleep apnea approximately one year ago with AHI of 37 on recent HST (07/2023). He has experienced difficulty tolerating CPAP therapy. He describes pulling the mask off during sleep and feeling paranoid and as if suffocating when first putting it on. Various types of masks were tried without success, and he last used CPAP several months ago.  He has a history of insomnia, characterized by difficulty falling asleep, often taking a couple of hours to do so. He does not take any medication for this condition.  He is currently taking Plavix , prescribed for peripheral arterial disease. No history of stroke or heart attack.    Records Reviewed:  OP note by Dr Jude 02/10/24 The VOTE score at baseline wasc omplete  AP, partial AP , partial AP, partial AP.  With simulated jaw advancement and tongue advancement, the hypopharyngeal obstruction and secondarily the palatal collapse also improved.   In summary, there was no evidence of complete concentric palatal obstruction and he is a candidate anatomically for hypoglossal nerve stimulation therapy.    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    Past Surgical History:  Procedure Laterality Date   ABDOMINAL AORTOGRAM W/LOWER EXTREMITY Bilateral 10/29/2021   Procedure: ABDOMINAL AORTOGRAM W/LOWER EXTREMITY;  Surgeon: Court Dorn PARAS, MD;  Location: [facility] INVASIVE CV LAB;  Service: Cardiovascular;  Laterality: Bilateral;   APPENDECTOMY      CATARACT EXTRACTION Left 07/26/2022   CORONARY PRESSURE/FFR STUDY N/A 04/14/2023   Procedure: CORONARY PRESSURE/FFR STUDY;  Surgeon: Wendel Lurena POUR, MD;  Location: [facility] INVASIVE CV LAB;  Service: Cardiovascular;  Laterality: N/A;   DRUG INDUCED ENDOSCOPY N/A 02/10/2024   Procedure: DRUG INDUCED SLEEP ENDOSCOPY;  Surgeon: Jude Harden GAILS, MD;  Location: [facility] ENDOSCOPY;  Service: Pulmonary;  Laterality: N/A;   FRACTURE SURGERY Right    Right knee- age 70   LEFT HEART CATH AND CORONARY ANGIOGRAPHY N/A 04/14/2023   Procedure: LEFT HEART CATH AND CORONARY ANGIOGRAPHY;  Surgeon: Wendel Lurena POUR, MD;  Location: [facility] INVASIVE CV LAB;  Service: Cardiovascular;  Laterality: N/A;   LOWER EXTREMITY ANGIOGRAPHY N/A 10/02/2023   Procedure: Lower Extremity Angiography;  Surgeon: Court Dorn PARAS, MD;  Location: [facility] INVASIVE CV LAB;  Service: Cardiovascular;  Laterality: N/A;   LOWER EXTREMITY INTERVENTION  10/02/2023   Procedure: LOWER EXTREMITY INTERVENTION;  Surgeon: Court Dorn PARAS, MD;  Location: [facility] INVASIVE CV LAB;  Service: Cardiovascular;;   PERIPHERAL VASCULAR ATHERECTOMY  10/29/2021   Procedure: PERIPHERAL VASCULAR ATHERECTOMY;  Surgeon: Court Dorn PARAS, MD;  Location: [facility] INVASIVE CV LAB;  Service: Cardiovascular;;  Rt. SFA    Family History  Problem Relation Age of Onset   Diabetes Mother    Hypertension Mother    Alzheimer's disease Mother    Diabetes Father    Emphysema Father     Social History:  reports that he quit smoking about 18 months ago. His smoking use included cigarettes. He started smoking about 37 years  ago. He has a 37 pack-year smoking history. He has never used smokeless tobacco. He reports current alcohol use of about 18.0 standard drinks of alcohol per week. He reports that he does not use drugs.  Allergies: No Known Allergies  Medications: I have reviewed the patient's current medications.  The PMH, PSH, Medications, Allergies, and SH were reviewed and  updated.  ROS: Constitutional: Negative for fever, weight loss and weight gain. Cardiovascular: Negative for chest pain and dyspnea on exertion. Respiratory: Is not experiencing shortness of breath at rest. Gastrointestinal: Negative for nausea and vomiting. Neurological: Negative for headaches. Psychiatric: The patient is not nervous/anxious  Blood pressure 107/66, pulse 85, weight 264 lb 3.2 oz (119.8 kg), SpO2 93%. Body mass index is 30.53 kg/m.  PHYSICAL EXAM:  Exam: General: Well-developed, well-nourished Respiratory Respiratory effort: Equal inspiration and expiration without stridor Cardiovascular Peripheral Vascular: Warm extremities with equal color/perfusion Eyes: No nystagmus with equal extraocular motion bilaterally Neuro/Psych/Balance: Patient oriented to person, place, and time; Appropriate mood and affect; Gait is intact with no imbalance; Cranial nerves I-XII are intact Head and Face Inspection: Normocephalic and atraumatic without mass or lesion Palpation: Facial skeleton intact without bony stepoffs Salivary Glands: No mass or tenderness Facial Strength: Facial motility symmetric and full bilaterally ENT Pinna: External ear intact and fully developed External canal: Canal is patent with intact skin Tympanic Membrane: Clear and mobile External Nose: No scar or anatomic deformity Internal Nose: Septum is relatively straight. No polyp, or purulence. Mucosal edema and erythema present.  Bilateral inferior turbinate hypertrophy.  Lips, Teeth, and gums: Mucosa and teeth intact and viable TMJ: No pain to palpation with full mobility Oral cavity/oropharynx: No erythema or exudate, no lesions present Friedman IV tongue position, no significant tonsillar tissue  Prominent base of the tongue with scope exam Nasopharynx: No mass or lesion with intact mucosa Hypopharynx: Intact mucosa without pooling of secretions Larynx Glottic: Full true vocal cord mobility without  lesion or mass Supraglottic: Normal appearing epiglottis and AE folds Interarytenoid Space: Moderate pachydermia&edema Subglottic Space: Patent without lesion or edema Neck Neck and Trachea: Midline trachea without mass or lesion Thyroid : No mass or nodularity Lymphatics: No lymphadenopathy  Procedure: Preoperative diagnosis: OSA CPAP intolerance  Postoperative diagnosis:   Same  Procedure: Flexible fiberoptic laryngoscopy  Surgeon: Elena Larry, MD  Anesthesia: Topical lidocaine  and Afrin Complications: None Condition is stable throughout exam  Indications and consent:  The patient presents to the clinic with above symptoms. Indirect laryngoscopy view was incomplete. Thus it was recommended that they undergo a flexible fiberoptic laryngoscopy. All of the risks, benefits, and potential complications were reviewed with the patient preoperatively and verbal informed consent was obtained.  Procedure: The patient was seated upright in the clinic. Topical lidocaine  and Afrin were applied to the nasal cavity. After adequate anesthesia had occurred, I then proceeded to pass the flexible telescope into the nasal cavity. The nasal cavity was patent without rhinorrhea or polyp. The nasopharynx was also patent without mass or lesion. The base of tongue was visualized and was normal. There were no signs of pooling of secretions in the piriform sinuses. The true vocal folds were mobile bilaterally. There were no signs of glottic or supraglottic mucosal lesion or mass. There was moderate interarytenoid pachydermia and post cricoid edema. The telescope was then slowly withdrawn and the patient tolerated the procedure throughout.      Studies Reviewed: HST 08/06/23   Assessment/Plan: Encounter Diagnoses  Name Primary?   Obstructive sleep apnea Yes  Intolerance of continuous positive airway pressure (CPAP) ventilation     Assessment and Plan Assessment & Plan Severe Obstructive sleep  apnea (OSA)   HST 07/2013 with AHI of 37, BMI today is 30.5. No central or mixed apneas.  OSA, severe, without multilevel collapse, with failure to tolerate PAP therapy and/or more conservative measures. Presence of smaller/absent tonsils and larger tongue position (Friedman tongue position or modified Mallampati) suggests that hypopharyngeal/retrolingual collapse is contributing to the patient's OSA. Janeth, M et al. Staging of obstructive sleep apnea/hypopnea syndrome: a guide to appropriate treatment. Laryngoscope, 2004 Mar, 114(3):454-9. PMID: [id number]) Options including positional therapy, weight loss, oral appliances, PAP and surgical correction discussed. Pt is not an ideal candidate for oral appliance due to severity of OSA Pt is a candidate for Hypoglossal nerve stimulation (Inspire therapy) based on DISE done by Dr Jude on 02/10/24 which did not reveal complete concentric collapse at the soft palate - Request information from Dr. Melvenia regarding holding Plavix  pre-operatively. - Schedule Inspire surgery  - Instructed to avoid strenuous activities for four weeks post-operatively. - Restart Plavix  the day after surgery. - Schedule follow-up appointment two weeks post-surgery to check on wounds. - Arrange for a ride home on the day of surgery.      Thank you for allowing me to participate in the care of this patient. Please do not hesitate to contact me with any questions or concerns.   Elena Larry, MD Otolaryngology [facility] Phone: [phone] Fax: [phone]    02/18/2024, 11:12 AM

## 2024-02-18 NOTE — H&P (View-Only) (Signed)
 ENT CONSULT:  Reason for Consult: OSA CPAP intolerance   HPI: Discussed the use of AI scribe software for clinical note transcription with the patient, who gave verbal consent to proceed.  History of Present Illness Timothy Nielsen is a 53 year old male who presents with CPAP intolerance.   He was diagnosed with severe sleep apnea approximately one year ago with AHI of 37 on recent HST (07/2023). He has experienced difficulty tolerating CPAP therapy. He describes pulling the mask off during sleep and feeling paranoid and as if suffocating when first putting it on. Various types of masks were tried without success, and he last used CPAP several months ago.  He has a history of insomnia, characterized by difficulty falling asleep, often taking a couple of hours to do so. He does not take any medication for this condition.  He is currently taking Plavix , prescribed for peripheral arterial disease. No history of stroke or heart attack.    Records Reviewed:  OP note by Dr Jude 02/10/24 The VOTE score at baseline wasc omplete  AP, partial AP , partial AP, partial AP.  With simulated jaw advancement and tongue advancement, the hypopharyngeal obstruction and secondarily the palatal collapse also improved.   In summary, there was no evidence of complete concentric palatal obstruction and he is a candidate anatomically for hypoglossal nerve stimulation therapy.    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    Past Surgical History:  Procedure Laterality Date   ABDOMINAL AORTOGRAM W/LOWER EXTREMITY Bilateral 10/29/2021   Procedure: ABDOMINAL AORTOGRAM W/LOWER EXTREMITY;  Surgeon: Court Dorn PARAS, MD;  Location: [facility] INVASIVE CV LAB;  Service: Cardiovascular;  Laterality: Bilateral;   APPENDECTOMY      CATARACT EXTRACTION Left 07/26/2022   CORONARY PRESSURE/FFR STUDY N/A 04/14/2023   Procedure: CORONARY PRESSURE/FFR STUDY;  Surgeon: Wendel Lurena POUR, MD;  Location: [facility];  Service: Cardiovascular;  Laterality: N/A;   DRUG INDUCED ENDOSCOPY N/A 02/10/2024   Procedure: DRUG INDUCED SLEEP ENDOSCOPY;  Surgeon: Jude Harden GAILS, MD;  Location: [facility] ENDOSCOPY;  Service: Pulmonary;  Laterality: N/A;   FRACTURE SURGERY Right    Right knee- age 70   LEFT HEART CATH AND CORONARY ANGIOGRAPHY N/A 04/14/2023   Procedure: LEFT HEART CATH AND CORONARY ANGIOGRAPHY;  Surgeon: Wendel Lurena POUR, MD;  Location: [facility];  Service: Cardiovascular;  Laterality: N/A;   LOWER EXTREMITY ANGIOGRAPHY N/A 10/02/2023   Procedure: Lower Extremity Angiography;  Surgeon: Court Dorn PARAS, MD;  Location: [facility] INVASIVE CV LAB;  Service: Cardiovascular;  Laterality: N/A;   LOWER EXTREMITY INTERVENTION  10/02/2023   Procedure: LOWER EXTREMITY INTERVENTION;  Surgeon: Court Dorn PARAS, MD;  Location: [facility];  Service: Cardiovascular;;   PERIPHERAL VASCULAR ATHERECTOMY  10/29/2021   Procedure: PERIPHERAL VASCULAR ATHERECTOMY;  Surgeon: Court Dorn PARAS, MD;  Location: [facility] INVASIVE CV LAB;  Service: Cardiovascular;;  Rt. SFA    Family History  Problem Relation Age of Onset   Diabetes Mother    Hypertension Mother    Alzheimer's disease Mother    Diabetes Father    Emphysema Father     Social History:  reports that he quit smoking about 18 months ago. His smoking use included cigarettes. He started smoking about 37 years  ago. He has a 37 pack-year smoking history. He has never used smokeless tobacco. He reports current alcohol use of about 18.0 standard drinks of alcohol per week. He reports that he does not use drugs.  Allergies: No Known Allergies  Medications: I have reviewed the patient's current medications.  The PMH, PSH, Medications, Allergies, and SH were reviewed and  updated.  ROS: Constitutional: Negative for fever, weight loss and weight gain. Cardiovascular: Negative for chest pain and dyspnea on exertion. Respiratory: Is not experiencing shortness of breath at rest. Gastrointestinal: Negative for nausea and vomiting. Neurological: Negative for headaches. Psychiatric: The patient is not nervous/anxious  Blood pressure 107/66, pulse 85, weight 264 lb 3.2 oz (119.8 kg), SpO2 93%. Body mass index is 30.53 kg/m.  PHYSICAL EXAM:  Exam: General: Well-developed, well-nourished Respiratory Respiratory effort: Equal inspiration and expiration without stridor Cardiovascular Peripheral Vascular: Warm extremities with equal color/perfusion Eyes: No nystagmus with equal extraocular motion bilaterally Neuro/Psych/Balance: Patient oriented to person, place, and time; Appropriate mood and affect; Gait is intact with no imbalance; Cranial nerves I-XII are intact Head and Face Inspection: Normocephalic and atraumatic without mass or lesion Palpation: Facial skeleton intact without bony stepoffs Salivary Glands: No mass or tenderness Facial Strength: Facial motility symmetric and full bilaterally ENT Pinna: External ear intact and fully developed External canal: Canal is patent with intact skin Tympanic Membrane: Clear and mobile External Nose: No scar or anatomic deformity Internal Nose: Septum is relatively straight. No polyp, or purulence. Mucosal edema and erythema present.  Bilateral inferior turbinate hypertrophy.  Lips, Teeth, and gums: Mucosa and teeth intact and viable TMJ: No pain to palpation with full mobility Oral cavity/oropharynx: No erythema or exudate, no lesions present Friedman IV tongue position, no significant tonsillar tissue  Prominent base of the tongue with scope exam Nasopharynx: No mass or lesion with intact mucosa Hypopharynx: Intact mucosa without pooling of secretions Larynx Glottic: Full true vocal cord mobility without  lesion or mass Supraglottic: Normal appearing epiglottis and AE folds Interarytenoid Space: Moderate pachydermia&edema Subglottic Space: Patent without lesion or edema Neck Neck and Trachea: Midline trachea without mass or lesion Thyroid : No mass or nodularity Lymphatics: No lymphadenopathy  Procedure: Preoperative diagnosis: OSA CPAP intolerance  Postoperative diagnosis:   Same  Procedure: Flexible fiberoptic laryngoscopy  Surgeon: Elena Larry, MD  Anesthesia: Topical lidocaine  and Afrin Complications: None Condition is stable throughout exam  Indications and consent:  The patient presents to the clinic with above symptoms. Indirect laryngoscopy view was incomplete. Thus it was recommended that they undergo a flexible fiberoptic laryngoscopy. All of the risks, benefits, and potential complications were reviewed with the patient preoperatively and verbal informed consent was obtained.  Procedure: The patient was seated upright in the clinic. Topical lidocaine  and Afrin were applied to the nasal cavity. After adequate anesthesia had occurred, I then proceeded to pass the flexible telescope into the nasal cavity. The nasal cavity was patent without rhinorrhea or polyp. The nasopharynx was also patent without mass or lesion. The base of tongue was visualized and was normal. There were no signs of pooling of secretions in the piriform sinuses. The true vocal folds were mobile bilaterally. There were no signs of glottic or supraglottic mucosal lesion or mass. There was moderate interarytenoid pachydermia and post cricoid edema. The telescope was then slowly withdrawn and the patient tolerated the procedure throughout.      Studies Reviewed: HST 08/06/23   Assessment/Plan: Encounter Diagnoses  Name Primary?   Obstructive sleep apnea Yes  Intolerance of continuous positive airway pressure (CPAP) ventilation     Assessment and Plan Assessment & Plan Severe Obstructive sleep  apnea (OSA)   HST 07/2013 with AHI of 37, BMI today is 30.5. No central or mixed apneas.  OSA, severe, without multilevel collapse, with failure to tolerate PAP therapy and/or more conservative measures. Presence of smaller/absent tonsils and larger tongue position (Friedman tongue position or modified Mallampati) suggests that hypopharyngeal/retrolingual collapse is contributing to the patient's OSA. Janeth, M et al. Staging of obstructive sleep apnea/hypopnea syndrome: a guide to appropriate treatment. Laryngoscope, 2004 Mar, 114(3):454-9. PMID: [id number]) Options including positional therapy, weight loss, oral appliances, PAP and surgical correction discussed. Pt is not an ideal candidate for oral appliance due to severity of OSA Pt is a candidate for Hypoglossal nerve stimulation (Inspire therapy) based on DISE done by Dr Jude on 02/10/24 which did not reveal complete concentric collapse at the soft palate - Request information from Dr. Melvenia regarding holding Plavix  pre-operatively. - Schedule Inspire surgery  - Instructed to avoid strenuous activities for four weeks post-operatively. - Restart Plavix  the day after surgery. - Schedule follow-up appointment two weeks post-surgery to check on wounds. - Arrange for a ride home on the day of surgery.      Thank you for allowing me to participate in the care of this patient. Please do not hesitate to contact me with any questions or concerns.   Elena Larry, MD Otolaryngology [facility] Phone: [phone] Fax: [phone]    02/18/2024, 11:12 AM

## 2024-03-02 ENCOUNTER — Other Ambulatory Visit: Payer: Self-pay | Admitting: Internal Medicine

## 2024-03-02 DIAGNOSIS — I739 Peripheral vascular disease, unspecified: Secondary | ICD-10-CM

## 2024-03-04 ENCOUNTER — Telehealth: Payer: Self-pay

## 2024-03-04 NOTE — Telephone Encounter (Signed)
 Received surgical assessment form from [facility]NT specialists for upcoming inspire implant. Last OV 01/14/2024.  Dr. Alva, please advise. Thanks

## 2024-03-05 ENCOUNTER — Telehealth (INDEPENDENT_AMBULATORY_CARE_PROVIDER_SITE_OTHER): Payer: Self-pay

## 2024-03-05 ENCOUNTER — Telehealth: Payer: Self-pay | Admitting: *Deleted

## 2024-03-05 NOTE — Telephone Encounter (Signed)
 Patient called regarding instructions for surgery. Patient is on Plavix  and clearance was sent to [facility]. Patient called us  because he had not received instructions. I called Pulm and asked them to give the patient or us  a call back (preferably the patient)  with the instructions from Dr. Harden. I let the patient know that either [facility] or Pulm would give him a call back. Patient understood.

## 2024-03-05 NOTE — Telephone Encounter (Signed)
 Left message at [facility]ts pcp has him on Plavix . Per Dr. Jude pt was cleared from pulmonary standpoint.

## 2024-03-08 ENCOUNTER — Encounter (HOSPITAL_BASED_OUTPATIENT_CLINIC_OR_DEPARTMENT_OTHER): Payer: Self-pay | Admitting: *Deleted

## 2024-03-08 ENCOUNTER — Other Ambulatory Visit: Payer: Self-pay

## 2024-03-08 NOTE — Telephone Encounter (Signed)
 Please see 03/05/2024 phone note.

## 2024-03-09 ENCOUNTER — Encounter: Payer: Self-pay | Admitting: Cardiology

## 2024-03-09 ENCOUNTER — Ambulatory Visit: Attending: Cardiology | Admitting: Cardiology

## 2024-03-09 VITALS — BP 118/70 | HR 75 | Ht 78.0 in | Wt 269.8 lb

## 2024-03-09 DIAGNOSIS — I2089 Other forms of angina pectoris: Secondary | ICD-10-CM | POA: Diagnosis present

## 2024-03-09 DIAGNOSIS — I2510 Atherosclerotic heart disease of native coronary artery without angina pectoris: Secondary | ICD-10-CM | POA: Diagnosis not present

## 2024-03-09 DIAGNOSIS — I739 Peripheral vascular disease, unspecified: Secondary | ICD-10-CM | POA: Diagnosis present

## 2024-03-09 DIAGNOSIS — I25119 Atherosclerotic heart disease of native coronary artery with unspecified angina pectoris: Secondary | ICD-10-CM | POA: Diagnosis not present

## 2024-03-09 DIAGNOSIS — E782 Mixed hyperlipidemia: Secondary | ICD-10-CM | POA: Insufficient documentation

## 2024-03-09 DIAGNOSIS — I10 Essential (primary) hypertension: Secondary | ICD-10-CM | POA: Diagnosis not present

## 2024-03-09 NOTE — Patient Instructions (Addendum)
 Medication Instructions:  Your physician has recommended you make the following change in your medication:  Hold your aspirin  and clopidogrel  starting tomorrow (03/10/2024) for your procedure & Restart as directed after your procedure. Continue all other medications as prescribed.  Labwork: none  Testing/Procedures: none  Follow-Up: Your physician recommends that you schedule a follow-up appointment in: 6 months  Any Other Special Instructions Will Be Listed Below (If Applicable).  If you need a refill on your cardiac medications before your next appointment, please call your pharmacy.

## 2024-03-09 NOTE — Progress Notes (Signed)
 Cardiology Office Note  Date: 03/09/2024   ID: Timothy Nielsen, DOB 07/15/1970, MRN [id number]  History of Present Illness: Timothy Nielsen is a 53 y.o. male last seen by Ms. Miriam NP in January.  Interval follow-up with Dr. Court also noted.  He is here for a routine visit.  Reports no increasing angina or dyspnea with typical activities, no interval nitroglycerin  use.  He is using a cane to ambulate, reports chronic pain but general improvement in claudication following PAD intervention earlier this year.  He is planning on Inspire device placement this coming Monday for treatment of severe OSA and CPAP intolerance.  We discussed holding aspirin  and Plavix  temporarily.  Medications reviewed.  He reports compliance with his baseline cardiac regimen.  LDL was down to 29 in April on Lipitor and Zetia .  I reviewed his ECG today which shows normal sinus rhythm.  Physical Exam: VS:  BP 118/70   Pulse 75   Ht 6' 6 (1.981 m)   Wt 269 lb 12.8 oz (122.4 kg)   SpO2 96%   BMI 31.18 kg/m , BMI Body mass index is 31.18 kg/m.  Wt Readings from Last 3 Encounters:  03/09/24 269 lb 12.8 oz (122.4 kg)  02/18/24 264 lb 3.2 oz (119.8 kg)  02/10/24 260 lb (117.9 kg)    General: Patient appears comfortable at rest. HEENT: Conjunctiva and lids normal. Neck: Supple, no elevated JVP or carotid bruits. Lungs: Clear to auscultation, nonlabored breathing at rest. Cardiac: Regular rate and rhythm, no S3 or significant systolic murmur. Extremities: No pitting edema.  ECG:  An ECG dated 04/14/2023 was personally reviewed today and demonstrated:  Sinus rhythm.  Labwork: 05/20/2023: B Natriuretic Peptide 19.0; TSH 1.550 10/03/2023: BUN 9; Creatinine, Ser 0.97; Hemoglobin 12.0; Platelets 198; Potassium 3.5; Sodium 132     Component Value Date/Time   CHOL 81 10/03/2023 0359   CHOL 123 11/13/2022 0954   TRIG 124 10/03/2023 0359   HDL 27 (L) 10/03/2023 0359   HDL 41 11/13/2022 0954   CHOLHDL 3.0 10/03/2023  0359   VLDL 25 10/03/2023 0359   LDLCALC 29 10/03/2023 0359   LDLCALC 60 11/13/2022 0954   Other Studies Reviewed Today:  No interval cardiac testing for review today.  Assessment and Plan:  1.  CAD, overall mild to moderate with approximately 50% RCA stenosis (RFR 0.98 and FFR 0.93) by cardiac catheterization in October 2024.  Did have evidence of microvascular dysfunction at that time with plan for medical therapy.  LVEF 50 to 55%.  No worsening angina on current regimen.  Currently on aspirin  81 mg daily, Lipitor 80 mg daily, Zetia  10 mg daily, and as needed nitroglycerin .   2.  PAD status post directional atherectomy and drug-coated balloon angioplasty of the right SFA in May 2023.  He underwent PTA and drug-eluting stent intervention to the mid right SFA in April.  Overall improvement in claudication.  On Plavix  75 mg daily, Lipitor 80 mg daily, and Zetia  10 mg daily.   3.  Primary hypertension.  Blood pressure is well-controlled today.  Continue Lopressor  25 mg twice daily and Zestoretic  10/12.5 mg daily.   4.  Mixed hyperlipidemia.  LDL 29 in April.  Continue Lipitor 80 mg daily and Zetia  10 mg daily.  5.  Severe OSA, CPAP intolerant.  Chart indicates plan for Inspire device placement this coming Monday with Dr. Soldatova.  Patient will hold aspirin  and Plavix  starting tomorrow, can likely resume day after operation at surgeon's agreement.  Disposition:  Follow up 6 months.  Signed, Jayson JUDITHANN Sierras, M.D., F.A.C.C. [facility] at [facility]

## 2024-03-10 ENCOUNTER — Telehealth (INDEPENDENT_AMBULATORY_CARE_PROVIDER_SITE_OTHER): Payer: Self-pay

## 2024-03-10 NOTE — Telephone Encounter (Signed)
 Spoke to patient regarding instructions for surgery. Patient explains that per his Cardiologist he is to stop Plavix  and Aspirin  from 9/17 to 9/23 for surgery.

## 2024-03-11 ENCOUNTER — Ambulatory Visit (HOSPITAL_BASED_OUTPATIENT_CLINIC_OR_DEPARTMENT_OTHER): Admitting: Pulmonary Disease

## 2024-03-12 ENCOUNTER — Encounter (HOSPITAL_BASED_OUTPATIENT_CLINIC_OR_DEPARTMENT_OTHER)
Admission: RE | Admit: 2024-03-12 | Discharge: 2024-03-12 | Disposition: A | Discharge status: Home / Self Care | Source: Ambulatory Visit | Attending: Otolaryngology | Admitting: Otolaryngology

## 2024-03-12 DIAGNOSIS — Z01812 Encounter for preprocedural laboratory examination: Secondary | ICD-10-CM | POA: Insufficient documentation

## 2024-03-12 LAB — BASIC METABOLIC PANEL WITH GFR
Anion gap: 12 (ref 5–15)
BUN: 7 mg/dL (ref 6–20)
CO2: 27 mmol/L (ref 22–32)
Calcium: 9.4 mg/dL (ref 8.9–10.3)
Chloride: 96 mmol/L — ABNORMAL LOW (ref 98–111)
Creatinine, Ser: 0.96 mg/dL (ref 0.61–1.24)
GFR, Estimated: >60 mL/min (ref >60)
Glucose, Bld: 142 mg/dL — ABNORMAL HIGH (ref 70–99)
Potassium: 4 mmol/L (ref 3.5–5.1)
Sodium: 135 mmol/L (ref 135–145)

## 2024-03-15 ENCOUNTER — Encounter (HOSPITAL_BASED_OUTPATIENT_CLINIC_OR_DEPARTMENT_OTHER): Payer: Self-pay | Admitting: Anesthesiology

## 2024-03-15 ENCOUNTER — Other Ambulatory Visit: Payer: Self-pay

## 2024-03-15 ENCOUNTER — Encounter (HOSPITAL_BASED_OUTPATIENT_CLINIC_OR_DEPARTMENT_OTHER)
Admission: RE | Disposition: A | Discharge status: Home / Self Care | Payer: Self-pay | Source: Home / Self Care | Attending: Otolaryngology

## 2024-03-15 ENCOUNTER — Ambulatory Visit (HOSPITAL_BASED_OUTPATIENT_CLINIC_OR_DEPARTMENT_OTHER)
Admission: RE | Admit: 2024-03-15 | Discharge: 2024-03-15 | Disposition: A | Discharge status: Home / Self Care | Attending: Otolaryngology | Admitting: Otolaryngology

## 2024-03-15 ENCOUNTER — Ambulatory Visit (HOSPITAL_BASED_OUTPATIENT_CLINIC_OR_DEPARTMENT_OTHER): Admitting: Anesthesiology

## 2024-03-15 ENCOUNTER — Ambulatory Visit (HOSPITAL_COMMUNITY)

## 2024-03-15 ENCOUNTER — Other Ambulatory Visit: Payer: Self-pay | Admitting: Internal Medicine

## 2024-03-15 DIAGNOSIS — G4733 Obstructive sleep apnea (adult) (pediatric): Secondary | ICD-10-CM

## 2024-03-15 DIAGNOSIS — I739 Peripheral vascular disease, unspecified: Secondary | ICD-10-CM | POA: Diagnosis not present

## 2024-03-15 DIAGNOSIS — Z91198 Patient's noncompliance with other medical treatment and regimen for other reason: Secondary | ICD-10-CM | POA: Diagnosis not present

## 2024-03-15 DIAGNOSIS — Z87891 Personal history of nicotine dependence: Secondary | ICD-10-CM | POA: Diagnosis not present

## 2024-03-15 DIAGNOSIS — J449 Chronic obstructive pulmonary disease, unspecified: Secondary | ICD-10-CM | POA: Diagnosis not present

## 2024-03-15 DIAGNOSIS — Z01818 Encounter for other preprocedural examination: Secondary | ICD-10-CM

## 2024-03-15 DIAGNOSIS — Z7902 Long term (current) use of antithrombotics/antiplatelets: Secondary | ICD-10-CM | POA: Diagnosis not present

## 2024-03-15 DIAGNOSIS — I2510 Atherosclerotic heart disease of native coronary artery without angina pectoris: Secondary | ICD-10-CM

## 2024-03-15 DIAGNOSIS — E669 Obesity, unspecified: Secondary | ICD-10-CM | POA: Diagnosis not present

## 2024-03-15 DIAGNOSIS — Z789 Other specified health status: Secondary | ICD-10-CM

## 2024-03-15 DIAGNOSIS — Z6831 Body mass index (BMI) 31.0-31.9, adult: Secondary | ICD-10-CM | POA: Diagnosis not present

## 2024-03-15 DIAGNOSIS — I10 Essential (primary) hypertension: Secondary | ICD-10-CM | POA: Insufficient documentation

## 2024-03-15 DIAGNOSIS — E66811 Obesity, class 1: Secondary | ICD-10-CM

## 2024-03-15 HISTORY — PX: IMPLANTATION OF HYPOGLOSSAL NERVE STIMULATOR: SHX6827

## 2024-03-15 SURGERY — INSERTION, HYPOGLOSSAL NERVE STIMULATOR
Anesthesia: General | Site: Neck | Laterality: Right

## 2024-03-15 MED ORDER — FENTANYL CITRATE (PF) 100 MCG/2ML IJ SOLN
INTRAMUSCULAR | Status: AC
  Filled 2024-03-15: qty 2

## 2024-03-15 MED ORDER — PROPOFOL 10 MG/ML IV BOLUS
INTRAVENOUS | Status: DC | PRN
  Administered 2024-03-15: 100 mg via INTRAVENOUS
  Administered 2024-03-15: 300 mg via INTRAVENOUS

## 2024-03-15 MED ORDER — SUCCINYLCHOLINE CHLORIDE 200 MG/10ML IV SOSY
PREFILLED_SYRINGE | INTRAVENOUS | Status: DC | PRN
  Administered 2024-03-15: 140 mg via INTRAVENOUS

## 2024-03-15 MED ORDER — GENTAMICIN SULFATE 40 MG/ML IJ SOLN
INTRAMUSCULAR | Status: AC
  Filled 2024-03-15: qty 10

## 2024-03-15 MED ORDER — AMOXICILLIN-POT CLAVULANATE 875-125 MG PO TABS: 1.0000 | ORAL_TABLET | Freq: Two times a day (BID) | ORAL | 0 refills | Status: DC

## 2024-03-15 MED ORDER — HYDROMORPHONE HCL 1 MG/ML IJ SOLN: 0.2500 mg | INTRAMUSCULAR | Status: DC | PRN

## 2024-03-15 MED ORDER — LIDOCAINE-EPINEPHRINE 1 %-1:100000 IJ SOLN
INTRAMUSCULAR | Status: DC | PRN
  Administered 2024-03-15: 8 mL

## 2024-03-15 MED ORDER — PROPOFOL 500 MG/50ML IV EMUL
INTRAVENOUS | Status: DC | PRN
  Administered 2024-03-15: 100 ug/kg/min via INTRAVENOUS

## 2024-03-15 MED ORDER — DEXAMETHASONE SODIUM PHOSPHATE 4 MG/ML IJ SOLN
INTRAMUSCULAR | Status: DC | PRN
  Administered 2024-03-15: 5 mg via INTRAVENOUS

## 2024-03-15 MED ORDER — ONDANSETRON HCL 4 MG/2ML IJ SOLN: 4.0000 mg | Freq: Once | INTRAMUSCULAR | Status: DC | PRN

## 2024-03-15 MED ORDER — PHENYLEPHRINE HCL-NACL 20-0.9 MG/250ML-% IV SOLN
INTRAVENOUS | Status: DC | PRN
  Administered 2024-03-15: 50 ug/min via INTRAVENOUS

## 2024-03-15 MED ORDER — IBUPROFEN 600 MG PO TABS: 600.0000 mg | ORAL_TABLET | Freq: Four times a day (QID) | ORAL | 0 refills | Status: AC

## 2024-03-15 MED ORDER — LIDOCAINE HCL (CARDIAC) PF 100 MG/5ML IV SOSY
PREFILLED_SYRINGE | INTRAVENOUS | Status: DC | PRN
  Administered 2024-03-15: 100 mg via INTRAVENOUS

## 2024-03-15 MED ORDER — SODIUM CHLORIDE 0.9 % IV SOLN
INTRAVENOUS | Status: DC | PRN
  Administered 2024-03-15: 500 mL

## 2024-03-15 MED ORDER — MIDAZOLAM HCL 2 MG/2ML IJ SOLN
INTRAMUSCULAR | Status: AC
  Filled 2024-03-15: qty 2

## 2024-03-15 MED ORDER — ACETAMINOPHEN 500 MG PO TABS: 500.0000 mg | ORAL_TABLET | Freq: Four times a day (QID) | ORAL | 0 refills | Status: AC

## 2024-03-15 MED ORDER — OXYCODONE HCL 5 MG/5ML PO SOLN: 5.0000 mg | Freq: Once | ORAL | Status: DC | PRN

## 2024-03-15 MED ORDER — LACTATED RINGERS IV SOLN: INTRAVENOUS | Status: DC

## 2024-03-15 MED ORDER — MIDAZOLAM HCL 5 MG/5ML IJ SOLN
INTRAMUSCULAR | Status: DC | PRN
  Administered 2024-03-15: 1 mg via INTRAVENOUS

## 2024-03-15 MED ORDER — ROCURONIUM BROMIDE 10 MG/ML (PF) SYRINGE
PREFILLED_SYRINGE | INTRAVENOUS | Status: AC
  Filled 2024-03-15: qty 10

## 2024-03-15 MED ORDER — OXYCODONE HCL 5 MG PO TABS: 5.0000 mg | ORAL_TABLET | Freq: Once | ORAL | Status: DC | PRN

## 2024-03-15 MED ORDER — DROPERIDOL 2.5 MG/ML IJ SOLN: 0.6250 mg | Freq: Once | INTRAMUSCULAR | Status: DC | PRN

## 2024-03-15 MED ORDER — OXYCODONE HCL 5 MG PO TABS: 5.0000 mg | ORAL_TABLET | Freq: Three times a day (TID) | ORAL | 0 refills | Status: DC | PRN

## 2024-03-15 MED ORDER — DEXMEDETOMIDINE HCL IN NACL 400 MCG/100ML IV SOLN
INTRAVENOUS | Status: DC | PRN
  Administered 2024-03-15: 12 ug via INTRAVENOUS
  Administered 2024-03-15: 8 ug via INTRAVENOUS

## 2024-03-15 MED ORDER — CEFAZOLIN SODIUM-DEXTROSE 1-4 GM/50ML-% IV SOLN
INTRAVENOUS | Status: DC | PRN
  Administered 2024-03-15: 3 g via INTRAVENOUS

## 2024-03-15 MED ORDER — FENTANYL CITRATE (PF) 100 MCG/2ML IJ SOLN
INTRAMUSCULAR | Status: DC | PRN
  Administered 2024-03-15: 100 ug via INTRAVENOUS

## 2024-03-15 MED ORDER — PROPOFOL 10 MG/ML IV BOLUS
INTRAVENOUS | Status: AC
Start: 2024-03-15 — End: 2024-03-15
  Filled 2024-03-15: qty 20

## 2024-03-15 MED ORDER — ONDANSETRON HCL 4 MG/2ML IJ SOLN
INTRAMUSCULAR | Status: AC
  Filled 2024-03-15: qty 2

## 2024-03-15 MED ORDER — DEXAMETHASONE SODIUM PHOSPHATE 10 MG/ML IJ SOLN
INTRAMUSCULAR | Status: AC
  Filled 2024-03-15: qty 1

## 2024-03-15 SURGICAL SUPPLY — 66 items
BAG DECANTER FOR FLEXI CONT (MISCELLANEOUS) ×1 IMPLANT
BLADE CLIPPER SURG (BLADE) IMPLANT
BLADE SURG 15 STRL LF DISP TIS (BLADE) ×1 IMPLANT
CANISTER SUCT 1200ML W/VALVE (MISCELLANEOUS) ×1 IMPLANT
CORD BIPOLAR FORCEPS 12FT (ELECTRODE) ×1 IMPLANT
COVER PROBE CYLINDRICAL 5X96 (MISCELLANEOUS) ×1 IMPLANT
DERMABOND ADVANCED .7 DNX12 (GAUZE/BANDAGES/DRESSINGS) ×2 IMPLANT
DRAPE C-ARM 35X43 STRL (DRAPES) ×1 IMPLANT
DRAPE HEAD BAR (DRAPES) IMPLANT
DRAPE INCISE IOBAN 66X45 STRL (DRAPES) ×1 IMPLANT
DRAPE MICROSCOPE WILD 40.5X102 (DRAPES) ×1 IMPLANT
DRAPE UTILITY XL STRL (DRAPES) IMPLANT
DRSG TEGADERM 2-3/8X2-3/4 SM (GAUZE/BANDAGES/DRESSINGS) ×2 IMPLANT
ELECT COATED BLADE 2.86 ST (ELECTRODE) ×1 IMPLANT
ELECTRODE EMG 18 NIMS (NEUROSURGERY SUPPLIES) ×1 IMPLANT
ELECTRODE REM PT RTRN 9FT ADLT (ELECTROSURGICAL) ×1 IMPLANT
FORCEPS BIPOLAR SPETZLER 8 1.0 (NEUROSURGERY SUPPLIES) IMPLANT
GAUZE 4X4 16PLY ~~LOC~~+RFID DBL (SPONGE) ×1 IMPLANT
GAUZE SPONGE 4X4 12PLY STRL (GAUZE/BANDAGES/DRESSINGS) ×1 IMPLANT
GENERATOR PULSE INSPIRE (Generator) ×1 IMPLANT
GENERATOR PULSE INSPIRE IV (Generator) ×1 IMPLANT
GLOVE BIO SURGEON STRL SZ 6 (GLOVE) ×1 IMPLANT
GLOVE BIO SURGEON STRL SZ7.5 (GLOVE) ×1 IMPLANT
GLOVE BIOGEL PI IND STRL 7.5 (GLOVE) IMPLANT
GLOVE BIOGEL PI MICRO STRL 6.5 (GLOVE) IMPLANT
GLOVE SURG SS PI 7.0 STRL IVOR (GLOVE) IMPLANT
GOWN STRL REUS W/ TWL LRG LVL3 (GOWN DISPOSABLE) ×3 IMPLANT
HOOK RETRACT STAY BLUNT 12 (MISCELLANEOUS) IMPLANT
IV CATH 18G SAFETY (IV SOLUTION) ×1 IMPLANT
KIT NEURO ACCESSORY W/WRENCH (MISCELLANEOUS) IMPLANT
LEAD SENSING RESP INSPIRE (Lead) ×1 IMPLANT
LEAD SENSING RESP INSPIRE IV (Lead) ×1 IMPLANT
LEAD SLEEP STIM INSPIRE IV/V (Lead) ×1 IMPLANT
LEAD SLEEP STIMULATION INSPIRE (Lead) ×1 IMPLANT
LOOP VASCLR MAXI BLUE 18IN ST (MISCELLANEOUS) ×1 IMPLANT
LOOP VESSEL MINI RED (MISCELLANEOUS) ×1 IMPLANT
MANIFOLD NEPTUNE II (INSTRUMENTS) ×1 IMPLANT
MARKER SKIN DUAL TIP RULER LAB (MISCELLANEOUS) ×1 IMPLANT
NDL HYPO 25X1 1.5 SAFETY (NEEDLE) ×1 IMPLANT
NEEDLE HYPO 25X1 1.5 SAFETY (NEEDLE) ×1 IMPLANT
NS IRRIG 1000ML POUR BTL (IV SOLUTION) ×1 IMPLANT
PACK BASIN DAY SURGERY FS (CUSTOM PROCEDURE TRAY) ×1 IMPLANT
PACK ENT DAY SURGERY (CUSTOM PROCEDURE TRAY) ×1 IMPLANT
PASSER CATH 36 CODMAN DISP (NEUROSURGERY SUPPLIES) IMPLANT
PASSER CATH 38CM DISP (INSTRUMENTS) IMPLANT
PENCIL SMOKE EVACUATOR (MISCELLANEOUS) ×1 IMPLANT
PROBE NERVE STIMULATOR (NEUROSURGERY SUPPLIES) ×1 IMPLANT
REMOTE CONTROL SLEEP INSPIRE (MISCELLANEOUS) ×1 IMPLANT
SET WALTER ACTIVATION W/DRAPE (SET/KITS/TRAYS/PACK) ×1 IMPLANT
SHEARS HARMONIC 9CM CVD (BLADE) ×1 IMPLANT
SLEEVE SCD COMPRESS KNEE MED (STOCKING) ×1 IMPLANT
SPONGE INTESTINAL PEANUT (DISPOSABLE) ×2 IMPLANT
SUT MNCRL AB 4-0 PS2 18 (SUTURE) IMPLANT
SUT MON AB 5-0 PS2 18 (SUTURE) ×2 IMPLANT
SUT SILK 2 0 SH (SUTURE) IMPLANT
SUT SILK 2 0 SH CR/8 (SUTURE) ×1 IMPLANT
SUT SILK 2 0 TIES 17X18 (SUTURE) ×1 IMPLANT
SUT SILK 3 0 RB1 (SUTURE) ×1 IMPLANT
SUT SILK 3 0 REEL (SUTURE) IMPLANT
SUT SILK 3 0 SH 30 (SUTURE) IMPLANT
SUT SILK 3-0 18XBRD TIE BLK (SUTURE) IMPLANT
SUT VIC AB 3-0 SH 27X BRD (SUTURE) ×2 IMPLANT
SYR 10ML LL (SYRINGE) ×1 IMPLANT
SYR BULB EAR ULCER 3OZ GRN STR (SYRINGE) ×1 IMPLANT
TAPE CLOTH 3X10 WHT NS LF (GAUZE/BANDAGES/DRESSINGS) ×1 IMPLANT
TOWEL GREEN STERILE FF (TOWEL DISPOSABLE) ×2 IMPLANT

## 2024-03-15 NOTE — Anesthesia Postprocedure Evaluation (Signed)
 Anesthesia Post Note  Patient: Timothy Nielsen  Procedure(s) Performed: HYPOGLOSSAL NERVE STIMULATOR PLACEMENT (Right: Neck)     Patient location during evaluation: PACU Anesthesia Type: General Level of consciousness: awake and alert and oriented Pain management: pain level controlled Vital Signs Assessment: post-procedure vital signs reviewed and stable Respiratory status: spontaneous breathing, nonlabored ventilation and respiratory function stable Cardiovascular status: blood pressure returned to baseline and stable Postop Assessment: no apparent nausea or vomiting Anesthetic complications: no   No notable events documented.  Last Vitals:  Vitals:   03/15/24 1200 03/15/24 1230  BP: 132/78 136/78  Pulse: 84 80  Resp: 20 20  Temp:    SpO2: 93% 93%    Last Pain:  Vitals:   03/15/24 1230  TempSrc:   PainSc: 0-No pain                 Dianelly Ferran A.

## 2024-03-15 NOTE — Transfer of Care (Signed)
 Immediate Anesthesia Transfer of Care Note  Patient: Timothy Nielsen  Procedure(s) Performed: HYPOGLOSSAL NERVE STIMULATOR PLACEMENT (Right: Neck)  Patient Location: PACU  Anesthesia Type:General  Level of Consciousness: awake, alert , and patient cooperative  Airway & Oxygen Therapy: Patient Spontanous Breathing and Patient connected to face mask oxygen  Post-op Assessment: Report given to RN and Post -op Vital signs reviewed and stable  Post vital signs: Reviewed and stable  Last Vitals:  Vitals Value Taken Time  BP 139/80 03/15/24 11:42  Temp    Pulse 89 03/15/24 11:43  Resp 20 03/15/24 11:43  SpO2 96 % 03/15/24 11:43  Vitals shown include unfiled device data.  Last Pain:  Vitals:   03/15/24 0853  TempSrc: Temporal  PainSc: 0-No pain      Patients Stated Pain Goal: 7 (03/15/24 0853)  Complications: No notable events documented.

## 2024-03-15 NOTE — Anesthesia Procedure Notes (Signed)
 Procedure Name: Intubation Date/Time: 03/15/2024 10:05 AM  Performed by: Burnard Rosaline HERO, CRNAPre-anesthesia Checklist: Patient identified, Emergency Drugs available, Suction available and Patient being monitored Patient Re-evaluated:Patient Re-evaluated prior to induction Oxygen Delivery Method: Circle system utilized Preoxygenation: Pre-oxygenation with 100% oxygen Induction Type: IV induction Ventilation: Mask ventilation without difficulty Laryngoscope Size: Glidescope, Mac and 4 Grade View: Grade I Tube type: Oral Tube size: 8.0 mm Number of attempts: 1 Airway Equipment and Method: Stylet and Oral airway Placement Confirmation: ETT inserted through vocal cords under direct vision, positive ETCO2, breath sounds checked- equal and bilateral and CO2 detector Secured at: 24 cm Tube secured with: Tape Dental Injury: Teeth and Oropharynx as per pre-operative assessment

## 2024-03-15 NOTE — Op Note (Addendum)
 Operative Report                                                            SURGEON: Elena Larry, MD  ASSISTANT: Waddell Collier, RNFA  PREOPERATIVE DIAGNOSIS: Obstructive sleep apnea BMI 31  POSTOPERATIVE DIAGNOSIS: Obstructive sleep apnea. BMI 31  ANESTHESIA: General endotracheal.  ESTIMATED BLOOD LOSS: Less than 15 ml.  SPECIMENS: None.  DRAINS: None.  COMPLICATIONS: None.  Procedure: 12th cranial nerve (hypoglossal) stimulation implant along with placement of right pleural respiration sensor.  Electronic analysis of the implanted neurostimulator pulse generator system  Pre-Op Diagnosis: Moderate /Severe obstructive sleep apnea with positive airway pressure intolerance (ICD-10 G47.33). Post-Op Diagnosis: Moderate /Severe obstructive sleep apnea with positive pressure airway intolerance (ICD-10 G47.33).   Anesthesia: General endotracheal  Complications: None Brief Clinical History:     This is a 53 year old patient with a history of moderate /severe obstructive sleep apnea, who is intolerant and unable to achieve benefit from positive pressure therapy. Patient has passed the clinical, polysomnographic, and endoscopic screening criteria and presents today for the implant.    Procedure Description: The patient was brought to the Operating Room and was anesthetized via general endotracheal anesthesia without complication.  A shoulder roll was placed and the patient was prepped and draped in usual sterile fashion with the head turned to the left. Prior to prepping and draping, electrodes were placed in the genioglossus and styloglossus muscle and connected to the NIM box for intraoperative nerve monitoring.  A submental incision was made in the right upper neck approximately 2 cm below the mandible in the natural skin crease. Dissection was carried down through the subcutaneous tissue and platysma. The inferior border of the submandibular gland was identified as well as the  digastric tendon. The submandibular gland and the overlying fascia with the marginal mandibular nerve were retracted superiorly.  The digastric was retracted inferiorly.  Dissection was carried down into the digastric triangle where the hypoglossal nerve was identified in its usual fashion.   The mylohyoid muscle was retracted anteriorally, and the hypoglossal nerve was dissected up towards the floor of the mouth.  The lateral branches to retrusor muscles were identified, and tested intra-operatively using the NIM stimulator.  The cuff electrode for the hypoglossal nerve stimulator was placed distally to these branches on the medial nerve branch to the genioglossus muscle. Diagnostic evaluation confirmed activation of the genioglossus nerve, resulting in genioglossal activation and tongue protrusion, confirmed both visually and on NIM monitor. The stimulation electrode was then secured to the digastric tendon on its lateral surface with the provided anchor.  A second 5 cm incision was made in the right upper chest approximately 3 cm below the clavicle.  Dissection was carried down to the pectoralis muscle. An inferior pocket was created deep to the subcutaneous layer and superficial to the pectoralis major muscle.  In the upper portion of our chest pocket, pectoralis muscle fibers were then divided until we encountered anterior chest wall. External intercostal muscle was visualized and dissected until internal intercostal was visualized. Our respiratory sense lead was then tunneled in the fascial plane between external and internal intercostals. The distal anchor was secured to the anterior chest wall using 3-2.0 silk sutures and the proximal anchor was secured to the pec major facscia.  The stimulation  lead was then tunneled in a subplatysmal plane and brought out into the sub-clavicular pocket.  Pulse generator was then brought onto the field and the sense and stimulation pins were both connected to the  appropriate headers and secured using a torque screwdriver. The implantable pulse generator was placed in the subclavicular pocket and secured loosely to the pectoralis fascia using non-resorbable sutures.    Diagnostic evaluation of the system was run, confirming good respiratory wave from the sensing lead, good anterior tongue protrusion with stimulation, and normal impedence measurements.  All the wounds were thoroughly irrigated with bacitracin irrigation.  The wounds were then closed in multiple layers with deep Vicryl sutures and a 5-0 biosyn.  Dermabond was then applied to the skin.    The patient was then awakened, extubated, and transferred to Recovery Room in stable condition. All counts correct x2.  I was present for and performed the entire procedure  The RNFA assisted throughout the case. The RNFA was essential in retraction and counter traction when needed to make the case progress smoothly. This retraction and assistance made it possible to see the tissue planes for the procedure. The assistance was needed for blood control, tissue re-approximation and assisted with closure of the incision site

## 2024-03-15 NOTE — Discharge Instructions (Addendum)
 INSPIRE POST-OPERATIVE INSTRUCTIONS:   Please review post-operative and recovery instructions below that you will need to be aware of after Inspire Implant surgery.  Please restart all of your home medications if you take anything on a daily basis.  You can resume regular diet after this procedure.  You will be scheduled for an appointment with Dr. Soldatova to review details about surgery and to discuss the next steps.   DIET: Resume normal diet HYGIENE: Please wait until 48 hours after surgery before getting incisions on neck, chest, and torso wet. In the first 48 hours after surgery, will likely need to take sponge baths. WOUND CARE: Please leave pressure dressing on for 48 hours after surgery. Gently place antibiotic ointment over incisions 2 times per day; use clean q-tip. May place a clean bandage over incisions as needed. After 48 hours, you may get incisions wet with warm soap and water, but do not soak the incisions.  Pat area dry gently.  Immediately place antibiotic ointment. Take oral antibiotics as prescribed If skin around incision starts to get red (> 1cm), swollen, and/or more painful, please call the office ACTIVITY: Try to avoid sleeping on the side of your surgery, to the extent possible.   You may walk for exercise starting the day after surgery. For 2 weeks: Do not pick up anything greater than 5 pounds with the hand/arm that's on the same side as the surgery.  After 2 weeks, you may increase weight to 10 pounds.   Consider performing neck rolls 10 clockwise and 10 counterclockwise 3x/day. For 4 weeks, no strenuous activity (running, jogging, lifting weights, gardening, sports) or until cleared by physician.   PAIN MEDICATIONS: You will be prescribed Oxycodone  for pain.   If pain is not severe, consider taking Tylenol  650mg  every 6 hours Avoid aspirin  for 7 days after surgery Avoid direct heat (such as heating pads) to incision sites.   May gently place ice over  surgery sites as needed.  Please place a thin clean towel over skin first and then place ice bag over towel.  Ice for 10 minutes at a time only.  POST-OPERATIVE CLINIC APPOINTMENTS: 1 week: suture removal and wound check in the office.  1 month: device activation and wound check in the office. 2.5 months: check in visit to assess usage. 3-4 months: titration sleep study based on usage of >4 hrs/night.  4 months: final wound check in the office.  Yearly: device check at office.  SCAR CARE: After incisions have healed, you will have a scar, which will continue to evolve over the course of 12 months.  Caring for your incision scars will help them to be as minimal as possible. If you are out in the sun with incision exposure, please remember to place sunscreen over the incision and surrounding skin.   You may use vitamin E or "Scar ointment/cream" to help soften scar.  Please wait one month after surgery before starting this.    Post Anesthesia Home Care Instructions  Activity: Get plenty of rest for the remainder of the day. A responsible individual must stay with you for 24 hours following the procedure.  For the next 24 hours, DO NOT: -Drive a car -Advertising copywriter -Drink alcoholic beverages -Take any medication unless instructed by your physician -Make any legal decisions or sign important papers.  Meals: Start with liquid foods such as gelatin or soup. Progress to regular foods as tolerated. Avoid greasy, spicy, heavy foods. If nausea and/or vomiting occur, drink only  clear liquids until the nausea and/or vomiting subsides. Call your physician if vomiting continues.  Special Instructions/Symptoms: Your throat may feel dry or sore from the anesthesia or the breathing tube placed in your throat during surgery. If this causes discomfort, gargle with warm salt water. The discomfort should disappear within 24 hours.  If you had a scopolamine patch placed behind your ear for the management  of post- operative nausea and/or vomiting:  1. The medication in the patch is effective for 72 hours, after which it should be removed.  Wrap patch in a tissue and discard in the trash. Wash hands thoroughly with soap and water. 2. You may remove the patch earlier than 72 hours if you experience unpleasant side effects which may include dry mouth, dizziness or visual disturbances. 3. Avoid touching the patch. Wash your hands with soap and water after contact with the patch.    Post Anesthesia Home Care Instructions  Activity: Get plenty of rest for the remainder of the day. A responsible individual must stay with you for 24 hours following the procedure.  For the next 24 hours, DO NOT: -Drive a car -Advertising copywriter -Drink alcoholic beverages -Take any medication unless instructed by your physician -Make any legal decisions or sign important papers.  Meals: Start with liquid foods such as gelatin or soup. Progress to regular foods as tolerated. Avoid greasy, spicy, heavy foods. If nausea and/or vomiting occur, drink only clear liquids until the nausea and/or vomiting subsides. Call your physician if vomiting continues.  Special Instructions/Symptoms: Your throat may feel dry or sore from the anesthesia or the breathing tube placed in your throat during surgery. If this causes discomfort, gargle with warm salt water. The discomfort should disappear within 24 hours.

## 2024-03-15 NOTE — Interval H&P Note (Signed)
 History and Physical Interval Note:  03/15/2024 9:04 AM  Timothy Nielsen  has presented today for surgery, with the diagnosis of Obstructive sleep apnea.  The various methods of treatment have been discussed with the patient and family. After consideration of risks, benefits and other options for treatment, the patient has consented to  Procedure(s): INSERTION, HYPOGLOSSAL NERVE STIMULATOR (N/A) as a surgical intervention.  The patient's history has been reviewed, patient examined, no change in status, stable for surgery.  I have reviewed the patient's chart and labs.  Questions were answered to the patient's satisfaction.     Kristin Lamagna

## 2024-03-15 NOTE — Anesthesia Preprocedure Evaluation (Addendum)
 Anesthesia Evaluation  Patient identified by MRN, date of birth, ID band Patient awake    Reviewed: Allergy & Precautions, NPO status , Patient's Chart, lab work & pertinent test results, reviewed documented beta blocker date and time   Airway Mallampati: III  TM Distance: >3 FB     Dental  (+) Missing, Chipped, Dental Advisory Given   Pulmonary asthma , sleep apnea , COPD,  COPD inhaler, former smoker   breath sounds clear to auscultation + decreased breath sounds      Cardiovascular hypertension, Pt. on medications and Pt. on home beta blockers + CAD and + Peripheral Vascular Disease  Normal cardiovascular exam Rhythm:Regular Rate:Normal     Neuro/Psych  PSYCHIATRIC DISORDERS Anxiety Depression    Peripheral neuropathy Hx/o Sydenham's chorea  Neuromuscular disease    GI/Hepatic Neg liver ROS,GERD  Medicated,,  Endo/Other  Obesity HLD  Renal/GU negative Renal ROS  negative genitourinary   Musculoskeletal  (+) Arthritis , Osteoarthritis,    Abdominal  (+) + obese  Peds  Hematology negative hematology ROS (+)   Anesthesia Other Findings   Reproductive/Obstetrics                              Anesthesia Physical Anesthesia Plan  ASA: 3  Anesthesia Plan: General   Post-op Pain Management: Dilaudid  IV, Precedex  and Tylenol  PO (pre-op)*   Induction: Intravenous  PONV Risk Score and Plan: 3 and Treatment may vary due to age or medical condition, Midazolam , Ondansetron , Dexamethasone  and Propofol  infusion  Airway Management Planned: Oral ETT  Additional Equipment: None  Intra-op Plan:   Post-operative Plan: Extubation in OR  Informed Consent: I have reviewed the patients History and Physical, chart, labs and discussed the procedure including the risks, benefits and alternatives for the proposed anesthesia with the patient or authorized representative who has indicated his/her  understanding and acceptance.     Dental advisory given  Plan Discussed with: CRNA and Anesthesiologist  Anesthesia Plan Comments:          Anesthesia Quick Evaluation

## 2024-03-16 ENCOUNTER — Encounter (HOSPITAL_BASED_OUTPATIENT_CLINIC_OR_DEPARTMENT_OTHER): Payer: Self-pay | Admitting: Otolaryngology

## 2024-03-16 NOTE — Addendum Note (Signed)
 Addendum  created 03/16/24 0656 by Burnard Rosaline HERO, CRNA   Intraprocedure Event edited

## 2024-03-17 ENCOUNTER — Telehealth (INDEPENDENT_AMBULATORY_CARE_PROVIDER_SITE_OTHER): Payer: Self-pay

## 2024-03-17 NOTE — Telephone Encounter (Signed)
 Pt called stated there is a lot of swelling on the outside of his throat no heat he just wanted to see if this is normal, and if not what he needs to do.

## 2024-03-29 ENCOUNTER — Ambulatory Visit (HOSPITAL_BASED_OUTPATIENT_CLINIC_OR_DEPARTMENT_OTHER)
Admission: RE | Admit: 2024-03-29 | Discharge: 2024-03-29 | Disposition: A | Discharge status: Home / Self Care | Source: Ambulatory Visit | Attending: Cardiovascular Disease | Admitting: Cardiovascular Disease

## 2024-03-29 ENCOUNTER — Ambulatory Visit: Payer: Self-pay | Admitting: Cardiovascular Disease

## 2024-03-29 ENCOUNTER — Ambulatory Visit (HOSPITAL_COMMUNITY)
Admission: RE | Admit: 2024-03-29 | Discharge: 2024-03-29 | Disposition: A | Discharge status: Home / Self Care | Source: Ambulatory Visit | Attending: Cardiovascular Disease | Admitting: Cardiovascular Disease

## 2024-03-29 DIAGNOSIS — I739 Peripheral vascular disease, unspecified: Secondary | ICD-10-CM | POA: Insufficient documentation

## 2024-03-29 DIAGNOSIS — Z95820 Peripheral vascular angioplasty status with implants and grafts: Secondary | ICD-10-CM

## 2024-03-29 LAB — VAS US ABI WITH/WO TBI
Left ABI: 1.16
Right ABI: 1.16

## 2024-03-31 ENCOUNTER — Ambulatory Visit (INDEPENDENT_AMBULATORY_CARE_PROVIDER_SITE_OTHER): Admitting: Otolaryngology

## 2024-03-31 ENCOUNTER — Encounter (INDEPENDENT_AMBULATORY_CARE_PROVIDER_SITE_OTHER): Payer: Self-pay | Admitting: Otolaryngology

## 2024-03-31 VITALS — BP 125/78 | HR 87

## 2024-03-31 DIAGNOSIS — G4733 Obstructive sleep apnea (adult) (pediatric): Secondary | ICD-10-CM

## 2024-03-31 DIAGNOSIS — Z789 Other specified health status: Secondary | ICD-10-CM

## 2024-03-31 DIAGNOSIS — Z9889 Other specified postprocedural states: Secondary | ICD-10-CM

## 2024-03-31 NOTE — Progress Notes (Signed)
 ENT Progess Note   She is here for post-op check s/p Inspire. No issues with pain, no issues with tongue mobility. No swelling or erythema around surgical incisions.   Physical Exam: Right upper chest and neck incisions clean and dry, well-healed CN XII intact  A/P S/p Inspire, healing as expected Ok to proceed with activation as scheduled

## 2024-04-06 ENCOUNTER — Ambulatory Visit

## 2024-04-06 VITALS — BP 114/74 | HR 87 | Ht 78.0 in | Wt 275.0 lb

## 2024-04-06 DIAGNOSIS — R21 Rash and other nonspecific skin eruption: Secondary | ICD-10-CM | POA: Diagnosis not present

## 2024-04-06 NOTE — Progress Notes (Signed)
 Established Patient Office Visit  Subjective   Patient ID: Timothy Nielsen, male    DOB: 07/09/1970  Age: 53 y.o. MRN: [id number]  Chief Complaint  Patient presents with   Medical Management of Chronic Issues    Referral to dermatology     HPI Discussed the use of AI scribe software for clinical note transcription with the patient, who gave verbal consent to proceed.  History of Present Illness   Timothy Nielsen is a 53 year old male who presents with recurrent itchy welts on his skin.  Pruritic cutaneous eruptions - Recurrent outbreaks of itchy welts primarily on elbows, lower back, and occasionally on legs and hands - Lesions initially appear red and whelpy, resembling mosquito bites - Welts are transient, coming and going over time - Occasional bleeding of lesions when rubbed, attributed to use of blood thinners - Itching is significant and causes discomfort; managed by rubbing rather than scratching - No other associated symptoms  Response to antihistamines - Benadryl provides relief of itching but causes drowsiness - Uncertainty regarding alternative medications for symptom management  Peripheral artery disease - History of peripheral artery disease - Recent diagnostic testing for this condition      Patient Active Problem List   Diagnosis Date Noted   Rash and other nonspecific skin eruption 04/18/2024   Intolerance of continuous positive airway pressure (CPAP) ventilation 03/15/2024   BMI 31.0-31.9,adult 03/15/2024   Peripheral neuropathy 11/14/2023   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/2023   OSA (obstructive sleep apnea) 11/03/2023   Claudication in peripheral vascular disease 10/02/2023   Osteoarthritis of left knee 08/25/2023   Anxiety and depression 03/25/2023   Coronary artery calcification of native artery 03/25/2023   Right upper quadrant abdominal pain 10/29/2022   Vitamin D  deficiency 07/31/2022   Callus of foot 07/31/2022   Screening for colon cancer 06/26/2022    B12 deficiency 06/26/2022   HLD (hyperlipidemia) 06/26/2022   Peripheral arterial disease 09/11/2021   Tobacco use disorder 11/21/2016   GERD (gastroesophageal reflux disease) 11/21/2016   Asthma 11/21/2016   Aortic atherosclerosis 11/21/2016   Essential hypertension 09/29/2014      ROS    Objective:     BP 114/74   Pulse 87   Ht 6' 6 (1.981 m)   Wt 275 lb 0.6 oz (124.8 kg)   SpO2 95%   BMI 31.78 kg/m  BP Readings from Last 3 Encounters:  04/06/24 114/74  03/31/24 125/78  03/15/24 (!) 154/88   Wt Readings from Last 3 Encounters:  04/06/24 275 lb 0.6 oz (124.8 kg)  03/15/24 271 lb 9.7 oz (123.2 kg)  03/09/24 269 lb 12.8 oz (122.4 kg)      Physical Exam Vitals and nursing note reviewed.  Constitutional:      Appearance: Normal appearance.  HENT:     Head: Normocephalic.  Eyes:     Extraocular Movements: Extraocular movements intact.     Pupils: Pupils are equal, round, and reactive to light.  Cardiovascular:     Rate and Rhythm: Normal rate and regular rhythm.  Pulmonary:     Effort: Pulmonary effort is normal.     Breath sounds: Normal breath sounds.  Musculoskeletal:     Cervical back: Normal range of motion and neck supple.  Skin:    Findings: Erythema and rash present. Rash is urticarial (bilat elbows, and upper thighs).  Neurological:     Mental Status: He is alert and oriented to person, place, and time.  Psychiatric:  Mood and Affect: Mood normal.        Thought Content: Thought content normal.      No results found for any visits on 04/06/24.    The ASCVD Risk score (Arnett DK, et al., 2019) failed to calculate for the following reasons:   The valid total cholesterol range is 130 to 320 mg/dL    Assessment & Plan:   Problem List Items Addressed This Visit       Musculoskeletal and Integument   Rash and other nonspecific skin eruption - Primary    Recurrent pruritic urticarial skin eruptions Intermittent urticarial  eruptions on elbows, lower back, and legs. Symptoms exacerbated by scratching, complicated by anticoagulant therapy. Benadryl effective but causes drowsiness. - Trial non-drowsy antihistamines such as Claritin or Zyrtec. - Refer to dermatology for further evaluation.      Relevant Orders   Ambulatory referral to Dermatology   No follow-ups on file.    Leita Longs, FNP

## 2024-04-07 ENCOUNTER — Other Ambulatory Visit: Payer: Self-pay

## 2024-04-13 ENCOUNTER — Telehealth: Payer: Self-pay

## 2024-04-13 ENCOUNTER — Other Ambulatory Visit: Payer: Self-pay

## 2024-04-13 DIAGNOSIS — R21 Rash and other nonspecific skin eruption: Secondary | ICD-10-CM

## 2024-04-13 NOTE — Telephone Encounter (Signed)
 Pt advised with verbal understanding

## 2024-04-13 NOTE — Telephone Encounter (Signed)
 Patient called needs the referral to be sent to [facility] is conveniently located at [address] [place], [address]. The other referral that was put in does not take new patient for his concerns. Please send to dermatology [facility]. Patient has seen Leita Longs already on 04/06/2024. Call patient to confirm once this has been taken care of [phone] patient thought this has been taken care of.

## 2024-04-13 NOTE — Telephone Encounter (Signed)
 Copied from CRM [id number]. Topic: Referral - Request for Referral >> Apr 05, 2024 11:37 AM Zebedee SAUNDERS wrote: Did the patient discuss referral with their provider in the last year? Yes (If No - schedule appointment) (If Yes - send message)  Appointment offered? Yes  Type of order/referral and detailed reason for visit: Dermatologist  lower back , elbows, front thigh area  Preference of office, provider, location: Pt's area  If referral order, have you been seen by this specialty before? No (If Yes, this issue or another issue? When? Where?  Can we respond through MyChart? No >> Apr 13, 2024  1:43 PM Antwanette L wrote: The patient called regarding dermatology referral [phone]. After contacting Dr. Lamar Shock office, the patient learned that he is not in-network with [facility]. The patient would like the referral redirected to [facility] at [facility], [place], [place] [address]). >> Apr 05, 2024 12:51 PM Katheryn POUR wrote: scheduled

## 2024-04-13 NOTE — Telephone Encounter (Signed)
 Please advise see other tele message sent. Copied from CRM [id number]. Topic: Referral - Request for Referral >> Apr 05, 2024 11:37 AM Zebedee SAUNDERS wrote: Did the patient discuss referral with their provider in the last year? Yes (If No - schedule appointment) (If Yes - send message)  Appointment offered? Yes  Type of order/referral and detailed reason for visit: Dermatologist  lower back , elbows, front thigh area  Preference of office, provider, location: Pt's area  If referral order, have you been seen by this specialty before? No (If Yes, this issue or another issue? When? Where?  Can we respond through MyChart? No >> Apr 13, 2024  1:43 PM Antwanette L wrote: The patient called regarding dermatology referral [phone]. After contacting Dr. Lamar Shock office, the patient learned that he is not in-network with [facility]. The patient would like the referral redirected to [facility] at [facility], [address], [place], [place] [address]). >> Apr 05, 2024 12:51 PM Katheryn POUR wrote: scheduled

## 2024-04-13 NOTE — Telephone Encounter (Signed)
 Referral to dermatology at [facility] placed

## 2024-04-13 NOTE — Telephone Encounter (Signed)
 Duplicate encounters.

## 2024-04-13 NOTE — Telephone Encounter (Signed)
 Dermatology referral placed for [facility]

## 2024-04-14 NOTE — Telephone Encounter (Signed)
 Per Referral Notes - Patient declined to schedule an Appt at [facility].

## 2024-04-18 DIAGNOSIS — R21 Rash and other nonspecific skin eruption: Secondary | ICD-10-CM | POA: Insufficient documentation

## 2024-04-18 NOTE — Assessment & Plan Note (Signed)
 Recurrent pruritic urticarial skin eruptions Intermittent urticarial eruptions on elbows, lower back, and legs. Symptoms exacerbated by scratching, complicated by anticoagulant therapy. Benadryl effective but causes drowsiness. - Trial non-drowsy antihistamines such as Claritin or Zyrtec. - Refer to dermatology for further evaluation.

## 2024-04-19 ENCOUNTER — Other Ambulatory Visit: Payer: Self-pay

## 2024-04-19 DIAGNOSIS — K219 Gastro-esophageal reflux disease without esophagitis: Secondary | ICD-10-CM

## 2024-04-19 MED ORDER — OMEPRAZOLE 40 MG PO CPDR
40.0000 mg | DELAYED_RELEASE_CAPSULE | Freq: Every day | ORAL | 3 refills | Status: AC
Start: 2024-04-19 — End: ?

## 2024-04-21 ENCOUNTER — Telehealth: Payer: Self-pay | Admitting: Cardiology

## 2024-04-21 NOTE — Telephone Encounter (Signed)
   Pre-operative Risk Assessment    Patient Name: Timothy Nielsen  DOB: [birth year] MRN: [id number]     Request for Surgical Clearance    Procedure:  Plan to remove all teeth and smooth bone down  Date of Surgery:  Clearance TBD                                 Surgeon:  Comer Joshua Pitman Surgeon's Group or Practice Name:  [facility]  Phone number:  [phone] Fax number:  [phone]   Type of Clearance Requested:   - Pharmacy:  Hold Clopidogrel  (Plavix ) please advise. As well does the dr. Ellison INR check 1-2 days before procedure?   Type of Anesthesia:  4% Articaine 1:1000,000 with Epinephrine    Additional requests/questions:  Please send copy of clearance to office.  Signed, Darryle GORMAN Glance   04/21/2024, 10:06 AM

## 2024-04-21 NOTE — Telephone Encounter (Signed)
   Patient Name: Timothy Nielsen  DOB: November 30, 1970 MRN: [id number]  Primary Cardiologist: Jayson Sierras, MD  Chart reviewed as part of pre-operative protocol coverage. Given past medical history and time since last visit, based on ACC/AHA guidelines, [name] is at acceptable risk for the planned procedure without further cardiovascular testing.   Per Dr. Sierras 04/21/2024 Chart reviewed. Dental work with local anesthesia is low risk from a cardiac perspective and does not require further risk stratification in a clinically stable patient as he was at office visit in September. I see a request to hold Plavix  (he is on this for PAD reasons, not CAD). Not clear that an INR needs to be checked as requested. Would follow our protocol which I presume would recommend holding Plavix  prior to extensive dental work with multiple tooth extractions and bone work.   Per office protocol, if patient is without any new symptoms or concerns at the time of their virtual visit, he may hold Plavix  for 5 days prior to procedure. Please resume Plavix  as soon as possible postprocedure, at the discretion of the surgeon.    The patient was advised that if he develops new symptoms prior to surgery to contact our office to arrange for a follow-up visit, and he verbalized understanding.  I will route this recommendation to the requesting party via Epic fax function and remove from pre-op pool.  Please call with questions.  Lamarr Satterfield, NP 04/21/2024, 11:38 AM

## 2024-04-21 NOTE — Telephone Encounter (Signed)
 Dr. Debera,  You saw this patient on 03/09/2024. Per protocol we request that you comment on his cardiac risk to proceed with plan to remove all teeth and smooth bone down using  4% Articaine 1:1000,000 with Epinephrine  on TBD, since it has been less than 2 months since evaluated in the office. Please send your comment to P CV Pre-Op Pool.  Thank you, Lamarr Satterfield DNP, ANP, AACC.

## 2024-05-03 ENCOUNTER — Other Ambulatory Visit: Payer: Self-pay | Admitting: Internal Medicine

## 2024-05-03 DIAGNOSIS — G629 Polyneuropathy, unspecified: Secondary | ICD-10-CM

## 2024-05-06 ENCOUNTER — Encounter (HOSPITAL_BASED_OUTPATIENT_CLINIC_OR_DEPARTMENT_OTHER): Payer: Self-pay | Admitting: Pulmonary Disease

## 2024-05-06 ENCOUNTER — Ambulatory Visit (HOSPITAL_BASED_OUTPATIENT_CLINIC_OR_DEPARTMENT_OTHER): Admitting: Pulmonary Disease

## 2024-05-06 VITALS — BP 161/80 | HR 91 | Ht 78.0 in | Wt 277.4 lb

## 2024-05-06 DIAGNOSIS — G4733 Obstructive sleep apnea (adult) (pediatric): Secondary | ICD-10-CM | POA: Diagnosis not present

## 2024-05-06 DIAGNOSIS — Z9682 Presence of neurostimulator: Secondary | ICD-10-CM

## 2024-05-06 DIAGNOSIS — Z4542 Encounter for adjustment and management of neuropacemaker (brain) (peripheral nerve) (spinal cord): Secondary | ICD-10-CM

## 2024-05-06 NOTE — Patient Instructions (Signed)
 Your device was activated today Start delay 41m Pause time 15 m Duration 8h  Level 1 = 1.5 V Increase by 1 level every week until tongue discomfort  Try to use every night

## 2024-05-06 NOTE — Progress Notes (Signed)
 Subjective:    Patient ID: Timothy Nielsen, male    DOB: 12-18-1970, 53 y.o.   MRN: [id number]  53 yo  ex smoker for FU of severe OSA & mild COPD/ asthma  CPAP intolerant Underwent HNS implantation 03/15/24  PMH : CAD s/p angioplasty 10/2021  He smoked 37 pack years before he quit 06/2022     Discussed the use of AI scribe software for clinical note transcription with the patient, who gave verbal consent to proceed.  History of Present Illness Timothy Nielsen is a 53 year old male with obstructive sleep apnea who presents for hypoglossal nerve stimulator device activation.  He underwent hypoglossal nerve stimulator device implantation on March 15, 2024, with an uneventful procedure and satisfactory healing, though some residual scar tissue remains. His sleep pattern includes going to bed at 9 PM and waking at 6 AM, with frequent awakenings. He sometimes rises at 4 AM and moves to a recliner, where sleep quality improves. It takes about 30 minutes to fall asleep. During device activation, he experiences a sensation at the bottom of his tongue, similar to muscle tightening. No sharp tooth or discomfort in the mouth.    Significant tests/ events reviewed   07/2023 HST th AHI 37.2/hour and SpO2 low to 81%    LDCT chest 11/2023 RADS 2, stable 4mm nodule   LDCT 08/2022 3 mm nodule left lower lobe, mild emphysema, three-vessel CAD   PFTs 08/2022 mild COPD/asthma ratio 67, FEV1 67%, FVC 77%, FEV1 improved to 76% x 12% postbronchodilator, TLC 1 1 7%, DLCO normal    Review of Systems  neg for any significant sore throat, dysphagia, itching, sneezing, nasal congestion or excess/ purulent secretions, fever, chills, sweats, unintended wt loss, pleuritic or exertional cp, hempoptysis, orthopnea pnd or change in chronic leg swelling. Also denies presyncope, palpitations, heartburn, abdominal pain, nausea, vomiting, diarrhea or change in bowel or urinary habits, dysuria,hematuria, rash, arthralgias, visual  complaints, headache, numbness weakness or ataxia.      Objective:   Physical Exam  Gen. Pleasant, obese, in no distress ENT - no lesions, no post nasal drip Neck: No JVD, no thyromegaly, no carotid bruits Lungs: no use of accessory muscles, no dullness to percussion, decreased without rales or rhonchi  Cardiovascular: Rhythm regular, heart sounds  normal, no murmurs or gallops, no peripheral edema Musculoskeletal: No deformities, no cyanosis or clubbing , no tremors       Assessment & Plan:   Assessment and Plan Assessment & Plan Obstructive sleep apnea status post hypoglossal nerve stimulator activation Obstructive sleep apnea managed with hypoglossal nerve stimulator. Device activated today with initial settings to minimize discomfort and allow acclimatization. Gradual increase in voltage over three months to optimize efficacy. Sleep study planned post-titration to determine optimal settings. Anticipated improvement in sleep quality and reduction in apnea events as voltage increases. Ninety percent of patients achieve optimal settings by the fourth visit. - Activated hypoglossal nerve stimulator with initial settings of 1.5 to 2.5 volts. - Instructed to increase voltage by one level weekly until next visit. - Scheduled follow-up visit in four weeks to assess progress and adjust settings. - Plan sleep study after three months of titration to determine optimal settings. - Educated on device operation, including remote control and theatre stage manager. - Advised to keep phone near device in the morning for data sync.     Hypoglossal nerve stimulator was activated today.  Incision was checked, tongue protrusion was examined Programming : The activation workflow was followed 1.  Stimulation level : Sensation 1.4 V , functional level 1.5 V lower limit, upper limit 2.5 V 2.  Start delay 30 minutes, pause time 15 minutes, duration 8 hours 3.  Sensing waveform was analyzed for 3  minutes 4.  Sleep remote education was provided patient demonstrated competency with the remote and was aware of patient Instruction videos and sleep remote guide 5.  Patient was instructed to step up levels by 1 level (0.1 V ) every week 6.  Check-in visit in 1 month after activation visit to ensure that they are stepping up levels, using therapy  all night, every night and to evaluate subjective benefit.   7.  Inspire titration sleep study will be scheduled 3 months after the activation visit

## 2024-05-08 ENCOUNTER — Other Ambulatory Visit: Payer: Self-pay | Admitting: Internal Medicine

## 2024-05-08 DIAGNOSIS — G629 Polyneuropathy, unspecified: Secondary | ICD-10-CM

## 2024-05-14 ENCOUNTER — Other Ambulatory Visit: Payer: Self-pay

## 2024-05-14 DIAGNOSIS — E559 Vitamin D deficiency, unspecified: Secondary | ICD-10-CM

## 2024-05-17 ENCOUNTER — Ambulatory Visit

## 2024-05-18 ENCOUNTER — Ambulatory Visit

## 2024-05-18 VITALS — BP 110/58 | HR 82 | Resp 16 | Ht 78.0 in | Wt 281.0 lb

## 2024-05-18 DIAGNOSIS — Z0000 Encounter for general adult medical examination without abnormal findings: Secondary | ICD-10-CM

## 2024-05-18 DIAGNOSIS — Z2821 Immunization not carried out because of patient refusal: Secondary | ICD-10-CM

## 2024-05-18 DIAGNOSIS — Z23 Encounter for immunization: Secondary | ICD-10-CM | POA: Diagnosis not present

## 2024-05-18 NOTE — Progress Notes (Signed)
 Chief Complaint  Patient presents with   Medicare Wellness     Subjective:   Timothy Nielsen is a 53 y.o. male who presents for a Medicare Annual Wellness Visit.  Allergies (verified) Patient has no known allergies.   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   Past Surgical History:  Procedure Laterality Date   ABDOMINAL AORTOGRAM W/LOWER EXTREMITY Bilateral 10/29/2021   Procedure: ABDOMINAL AORTOGRAM W/LOWER EXTREMITY;  Surgeon: Court Dorn PARAS, MD;  Location: [facility];  Service: Cardiovascular;  Laterality: Bilateral;   APPENDECTOMY     CATARACT EXTRACTION Left 07/26/2022   CORONARY PRESSURE/FFR STUDY N/A 04/14/2023   Procedure: CORONARY PRESSURE/FFR STUDY;  Surgeon: Wendel Lurena POUR, MD;  Location: [facility];  Service: Cardiovascular;  Laterality: N/A;   DRUG INDUCED ENDOSCOPY N/A 02/10/2024   Procedure: DRUG INDUCED SLEEP ENDOSCOPY;  Surgeon: Jude Harden GAILS, MD;  Location: [facility] ENDOSCOPY;  Service: Pulmonary;  Laterality: N/A;   FRACTURE SURGERY Right    Right knee- age 77   IMPLANTATION OF HYPOGLOSSAL NERVE STIMULATOR Right 03/15/2024   Procedure: HYPOGLOSSAL NERVE STIMULATOR PLACEMENT;  Surgeon: Okey Burns, MD;  Location: [facility];  Service: ENT;  Laterality: Right;   LEFT HEART CATH AND CORONARY ANGIOGRAPHY N/A 04/14/2023   Procedure: LEFT HEART CATH AND CORONARY ANGIOGRAPHY;  Surgeon: Wendel Lurena POUR, MD;  Location: [facility];  Service: Cardiovascular;  Laterality: N/A;   LOWER EXTREMITY ANGIOGRAPHY N/A 10/02/2023   Procedure: Lower Extremity Angiography;  Surgeon: Court Dorn PARAS, MD;  Location: [facility];  Service: Cardiovascular;  Laterality: N/A;   LOWER EXTREMITY INTERVENTION  10/02/2023   Procedure:  LOWER EXTREMITY INTERVENTION;  Surgeon: Court Dorn PARAS, MD;  Location: [facility];  Service: Cardiovascular;;   PERIPHERAL VASCULAR ATHERECTOMY  10/29/2021   Procedure: PERIPHERAL VASCULAR ATHERECTOMY;  Surgeon: Court Dorn PARAS, MD;  Location: [facility] INVASIVE CV LAB;  Service: Cardiovascular;;  Rt. SFA   Family History  Problem Relation Age of Onset   Diabetes Mother    Hypertension Mother    Alzheimer's disease Mother    Diabetes Father    Emphysema Father    Social History   Occupational History   Not on file  Tobacco Use   Smoking status: Former    Current packs/day: 0.00    Average packs/day: 1 pack/day for 37.0 years (37.0 ttl pk-yrs)    Types: Cigarettes    Start date: 11/14/1986    Quit date: 07/22/2022    Years since quitting: 1.8   Smokeless tobacco: Never  Vaping Use   Vaping status: Never Used  Substance and Sexual Activity   Alcohol use: Yes    Alcohol/week: 18.0 standard drinks of alcohol    Types: 18 Cans of beer per week    Comment: 12 pack a week   Drug use: No   Sexual activity: Not on file   Tobacco Counseling Counseling given: Yes  SDOH Screenings   Food Insecurity: No Food Insecurity (05/18/2024)  Housing: Low Risk  (05/18/2024)  Transportation Needs: No Transportation Needs (05/18/2024)  Utilities: Not At Risk (05/18/2024)  Depression (PHQ2-9): Low Risk  (05/18/2024)  Financial Resource Strain: Medium Risk (03/21/2022)  Physical Activity: Inactive (05/18/2024)  Social Connections: Socially  Isolated (05/18/2024)  Stress: No Stress Concern Present (05/18/2024)  Tobacco Use: Medium Risk (05/18/2024)  Health Literacy: Adequate Health Literacy (05/18/2024)   See flowsheets for full screening details  Depression Screen PHQ 2 & 9 Depression Scale- Over the past 2 weeks, how often have you been bothered by any of the following problems? Little interest or pleasure in doing things: 0 Feeling down, depressed, or hopeless (PHQ Adolescent also  includes...irritable): 0 PHQ-2 Total Score: 0 Trouble falling or staying asleep, or sleeping too much: 0 Feeling tired or having little energy: 0 Poor appetite or overeating (PHQ Adolescent also includes...weight loss): 0 Feeling bad about yourself - or that you are a failure or have let yourself or your family down: 0 Trouble concentrating on things, such as reading the newspaper or watching television (PHQ Adolescent also includes...like school work): 0 Moving or speaking so slowly that other people could have noticed. Or the opposite - being so fidgety or restless that you have been moving around a lot more than usual: 0 Thoughts that you would be better off dead, or of hurting yourself in some way: 0 PHQ-9 Total Score: 0 If you checked off any problems, how difficult have these problems made it for you to do your work, take care of things at home, or get along with other people?: Not difficult at all     Goals Addressed             This Visit's Progress    get pain in feet and legs under better control and lose weight         Visit info / Clinical Intake: Medicare Wellness Visit Type:: Initial Annual Wellness Visit Persons participating in visit:: patient Medicare Wellness Visit Mode:: In-person (required for WTM) Information given by:: patient Interpreter Needed?: No Pre-visit prep was completed: yes AWV questionnaire completed by patient prior to visit?: no Living arrangements:: (!) lives alone Patient's Overall Health Status Rating: (!) fair Typical amount of pain: (!) a lot Does pain affect daily life?: (!) yes Are you currently prescribed opioids?: no  Dietary Habits and Nutritional Risks How many meals a day?: 2 Eats fruit and vegetables daily?: yes Most meals are obtained by: preparing own meals In the last 2 weeks, have you had any of the following?: none Diabetic:: no  Functional Status Activities of Daily Living (to include ambulation/medication):  Independent Ambulation: Independent with device- listed below Home Assistive Devices/Equipment: Cane Medication Administration: Independent Home Management: Needs assistance (comment) (has a friend that helps) Manage your own finances?: yes Primary transportation is: driving Concerns about vision?: no *vision screening is required for WTM* Concerns about hearing?: no  Fall Screening Falls in the past year?: 1 Number of falls in past year: 1 Was there an injury with Fall?: 0 Fall Risk Category Calculator: 2 Patient Fall Risk Level: Moderate Fall Risk  Fall Risk Patient at Risk for Falls Due to: History of fall(s); Impaired balance/gait; Impaired mobility; Orthopedic patient Fall risk Follow up: Falls evaluation completed; Education provided; Falls prevention discussed  Home and Transportation Safety: All rugs have non-skid backing?: yes All stairs or steps have railings?: yes Grab bars in the bathtub or shower?: (!) no Have non-skid surface in bathtub or shower?: yes Good home lighting?: yes Regular seat belt use?: yes Hospital stays in the last year:: (!) yes How many hospital stays:: 1 Reason: cardiology-stent placement  Cognitive Assessment Difficulty concentrating, remembering, or making decisions? : no Will 6CIT or Mini Cog be Completed: no 6CIT  or Mini Cog Declined: patient alert, oriented, able to answer questions appropriately and recall recent events  Advance Directives (For Healthcare) Does Patient Have a Medical Advance Directive?: No Would patient like information on creating a medical advance directive?: Yes (MAU/Ambulatory/Procedural Areas - Information given)  Reviewed/Updated  Reviewed/Updated: Reviewed All (Medical, Surgical, Family, Medications, Allergies, Care Teams, Patient Goals)        Objective:    Today's Vitals   05/18/24 1037  BP: (!) 110/58  Pulse: 82  Resp: 16  SpO2: 97%  Weight: 281 lb 0.6 oz (127.5 kg)  Height: 6' 6 (1.981 m)    Body mass index is 32.48 kg/m.  Current Medications (verified) Outpatient Encounter Medications as of 05/18/2024  Medication Sig   acetaminophen  (TYLENOL ) 500 MG tablet Take 1 tablet (500 mg total) by mouth every 6 (six) hours. Take every 6 hrs x 2-3 days then take every 6 hrs as needed   albuterol  (VENTOLIN  HFA) 108 (90 Base) MCG/ACT inhaler INHALE 2 PUFFS BY MOUTH EVERY 6 HOURS AS NEEDED FOR COUGH, WHEEZING, OR SHORTNESS OF BREATH   amoxicillin -clavulanate (AUGMENTIN ) 875-125 MG tablet Take 1 tablet by mouth 2 (two) times daily.   aspirin  EC 81 MG tablet Take 81 mg by mouth daily. Swallow whole.   atorvastatin  (LIPITOR) 80 MG tablet Take 1 tablet by mouth once daily   budesonide -formoterol  (BREYNA ) 160-4.5 MCG/ACT inhaler Inhale 2 puffs into the lungs 2 (two) times daily.   Cholecalciferol (VITAMIN D3) 25 MCG (1000 UT) CAPS Take 1 capsule by mouth once daily   clopidogrel  (PLAVIX ) 75 MG tablet Take 1 tablet (75 mg total) by mouth daily.   ezetimibe  (ZETIA ) 10 MG tablet Take 1 tablet (10 mg total) by mouth daily.   furosemide  (LASIX ) 20 MG tablet Take 1 tablet by mouth twice daily   gabapentin  (NEURONTIN ) 300 MG capsule TAKE 1 CAPSULE BY MOUTH THREE TIMES DAILY   ibuprofen  (ADVIL ) 600 MG tablet Take 1 tablet (600 mg total) by mouth every 6 (six) hours. Please take every 6 hrs, and stagger this medication 3 hrs apart from Tylenol    lisinopril -hydrochlorothiazide  (ZESTORETIC ) 10-12.5 MG tablet Take 1 tablet by mouth daily.   metoprolol  tartrate (LOPRESSOR ) 25 MG tablet Take 1 tablet by mouth twice daily   nitroGLYCERIN  (NITROSTAT ) 0.4 MG SL tablet Place 1 tablet (0.4 mg total) under the tongue every 5 (five) minutes as needed for chest pain.   omeprazole  (PRILOSEC) 40 MG capsule Take 1 capsule (40 mg total) by mouth daily.   [DISCONTINUED] oxyCODONE  (ROXICODONE ) 5 MG immediate release tablet Take 1 tablet (5 mg total) by mouth every 8 (eight) hours as needed. (Patient not taking: Reported on  05/18/2024)   No facility-administered encounter medications on file as of 05/18/2024.   Hearing/Vision screen Hearing Screening - Comments:: Patient denies any hearing difficulties.   Vision Screening - Comments:: Wears rx glasses - up to date with routine [facility] exams [facility]  [facility] Immunizations and Health Maintenance Health Maintenance  Topic Date Due   Medicare Annual Wellness (AWV)  Never done   DTaP/Tdap/Td (1 - Tdap) Never done   Pneumococcal Vaccine: 50+ Years (1 of 2 - PCV) Never done   Hepatitis B Vaccines 19-59 Average Risk (1 of 3 - 19+ 3-dose series) Never done   Zoster Vaccines- Shingrix (1 of 2) Never done   COVID-19 Vaccine (4 - 2025-26 season) 02/23/2024   Influenza Vaccine  09/21/2024 (Originally 01/23/2024)   Lung Cancer Screening  12/08/2024  Fecal DNA (Cologuard)  07/10/2025   Hepatitis C Screening  Completed   HIV Screening  Completed   HPV VACCINES  Aged Out   Meningococcal B Vaccine  Aged Out        Assessment/Plan:  This is a routine wellness examination for Timothy Nielsen.  Patient Care Team: Bevely Doffing, FNP as PCP - General (Family Medicine) Billee Mliss BIRCH, [facility] (Pharmacist) Onesimo Oneil LABOR, MD as Consulting Physician (Orthopedic Surgery) Debera Jayson MATSU, MD as Consulting Physician (Cardiology) Court Dorn PARAS, MD as Consulting Physician (Cardiology)  I have personally reviewed and noted the following in the patient's chart:   Medical and social history Use of alcohol, tobacco or illicit drugs  Current medications and supplements including opioid prescriptions. Functional ability and status Nutritional status Physical activity Advanced directives List of other physicians Hospitalizations, surgeries, and ER visits in previous 12 months Vitals Screenings to include cognitive, depression, and falls Referrals and appointments  Orders Placed This Encounter  Procedures   Pneumococcal conjugate vaccine 20-valent (PCV20)   In  addition, I have reviewed and discussed with patient certain preventive protocols, quality metrics, and best practice recommendations. A written personalized care plan for preventive services as well as general preventive health recommendations were provided to patient.   Beauford Lando, CMA   05/18/2024   Return on Monday May 23, 2025 at 1:50 pm, for your yearly Medicare Wellness Visit in person.  After Visit Summary: (In Person-Declined) Patient declined AVS at this time.  Nurse Notes: pneumonia 20 given in office

## 2024-05-18 NOTE — Patient Instructions (Signed)
 Timothy Nielsen,  Thank you for taking the time for your Medicare Wellness Visit. I appreciate your continued commitment to your health goals. Please review the care plan we discussed, and feel free to reach out if I can assist you further.  Please note that Annual Wellness Visits do not include a physical exam. Some assessments may be limited, especially if the visit was conducted virtually. If needed, we may recommend an in-person follow-up with your provider.  Ongoing Care Seeing your primary care provider every 3 to 6 months helps us  monitor your health and provide consistent, personalized care.   Referrals If a referral was made during today's visit and you haven't received any updates within two weeks, please contact the referred provider directly to check on the status.  Recommended Screenings:  Health Maintenance  Topic Date Due   Medicare Annual Wellness Visit  Never done   DTaP/Tdap/Td vaccine (1 - Tdap) Never done   Pneumococcal Vaccine for age over 55 (1 of 2 - PCV) Never done   Hepatitis B Vaccine (1 of 3 - 19+ 3-dose series) Never done   Zoster (Shingles) Vaccine (1 of 2) Never done   COVID-19 Vaccine (4 - 2025-26 season) 02/23/2024   Flu Shot  09/21/2024*   Screening for Lung Cancer  12/08/2024   Cologuard (Stool DNA test)  07/10/2025   Hepatitis C Screening  Completed   HIV Screening  Completed   HPV Vaccine  Aged Out   Meningitis B Vaccine  Aged Out  *Topic was postponed. The date shown is not the original due date.       05/18/2024   10:39 AM  Advanced Directives  Does Patient Have a Medical Advance Directive? No  Would patient like information on creating a medical advance directive? Yes (MAU/Ambulatory/Procedural Areas - Information given)    Vision: Annual vision screenings are recommended for early detection of glaucoma, cataracts, and diabetic retinopathy. These exams can also reveal signs of chronic conditions such as diabetes and high blood  pressure.  Dental: Annual dental screenings help detect early signs of oral cancer, gum disease, and other conditions linked to overall health, including heart disease and diabetes.  Please see the attached documents for additional preventive care recommendations.

## 2024-05-29 ENCOUNTER — Other Ambulatory Visit: Payer: Self-pay

## 2024-05-29 DIAGNOSIS — I739 Peripheral vascular disease, unspecified: Secondary | ICD-10-CM

## 2024-06-07 ENCOUNTER — Other Ambulatory Visit: Payer: Self-pay

## 2024-06-07 DIAGNOSIS — G629 Polyneuropathy, unspecified: Secondary | ICD-10-CM

## 2024-06-10 ENCOUNTER — Ambulatory Visit (INDEPENDENT_AMBULATORY_CARE_PROVIDER_SITE_OTHER): Admitting: Pulmonary Disease

## 2024-06-10 ENCOUNTER — Encounter (HOSPITAL_BASED_OUTPATIENT_CLINIC_OR_DEPARTMENT_OTHER): Payer: Self-pay | Admitting: Pulmonary Disease

## 2024-06-10 VITALS — BP 177/77 | HR 89 | Temp 97.7°F | Ht 78.0 in | Wt 278.4 lb

## 2024-06-10 DIAGNOSIS — G4733 Obstructive sleep apnea (adult) (pediatric): Secondary | ICD-10-CM

## 2024-06-10 DIAGNOSIS — Z9682 Presence of neurostimulator: Secondary | ICD-10-CM | POA: Diagnosis not present

## 2024-06-10 DIAGNOSIS — Z87891 Personal history of nicotine dependence: Secondary | ICD-10-CM

## 2024-06-10 NOTE — Progress Notes (Signed)
 Subjective:    Patient ID: Timothy Nielsen, male    DOB: 05-19-1971, 53 y.o.   MRN: [id number]   53 yo  ex smoker for FU of severe OSA & mild COPD/ asthma  CPAP intolerant Underwent HNS implantation 03/15/24   PMH : CAD s/p angioplasty 10/2021  He smoked 37 pack years before he quit 06/2022   04/2024 Activation :   Sensation 1.4 V , functional level 1.5 V lower limit, upper limit 2.5 V 2.  Start delay 30 minutes, pause time 15 minutes, duration 8 hours   Discussed the use of AI scribe software for clinical note transcription with the patient, who gave verbal consent to proceed.  History of Present Illness Timothy Nielsen is a 53 year old male who presents with increased daytime sleepiness and sleep disturbances.  Over the past week he has had increased daytime sleepiness despite regular use of his device, similar to when he first started treatment. He missed using the device one night early in treatment and one night this past week.  He has been increasing the device level weekly as instructed and is now on level five. Until last week he felt more rested, but now feels less rested with more daytime sleeping compared with the week before last.  He received a flu shot recently and felt unwell for about a week but does not feel this is related. He denies the device waking him up at night.  He is taking prednisone , though the dose and frequency are unclear.     Significant tests/ events reviewed   07/2023 HST th AHI 37.2/hour and SpO2 low to 81%    LDCT chest 11/2023 RADS 2, stable 4mm nodule   LDCT 08/2022 3 mm nodule left lower lobe, mild emphysema, three-vessel CAD   PFTs 08/2022 mild COPD/asthma ratio 67, FEV1 67%, FVC 77%, FEV1 improved to 76% x 12% postbronchodilator, TLC 1 1 7%, DLCO normal  Review of Systems  neg for any significant sore throat, dysphagia, itching, sneezing, nasal congestion or excess/ purulent secretions, fever, chills, sweats, unintended wt loss, pleuritic or  exertional cp, hempoptysis, orthopnea pnd or change in chronic leg swelling. Also denies presyncope, palpitations, heartburn, abdominal pain, nausea, vomiting, diarrhea or change in bowel or urinary habits, dysuria,hematuria, rash, arthralgias, visual complaints, headache, numbness weakness or ataxia.      Objective:   Physical Exam  Gen. Pleasant, obese, in no distress ENT - no lesions, no post nasal drip Neck: No JVD, no thyromegaly, no carotid bruits Lungs: no use of accessory muscles, no dullness to percussion, decreased without rales or rhonchi  Cardiovascular: Rhythm regular, heart sounds  normal, no murmurs or gallops, no peripheral edema Musculoskeletal: No deformities, no cyanosis or clubbing , no tremors  Tongue stimulation/protrusion checked with programmer at multiple levels       Assessment & Plan:   Assessment and Plan Assessment & Plan Obstructive Sleep Apnea Increased daytime sleepiness over the past week despite regular use of the Inspire device. He has been increasing the device level weekly as instructed but feels more tired during the day recently. Functional level assessed to be lower than previously thought, at 1.3 volts. Decision made to adjust the device to a lower level to improve sleep quality and reduce daytime sleepiness. A sleep study will be conducted to evaluate the effectiveness of different device levels and confirm the optimal setting. - Adjust Inspire device to 1.8 volts, one level lower than the current setting. - Maintain this level for  two weeks, then increase to level 7 and assess tolerance. - Schedule a sleep study to evaluate the effectiveness of different device levels. - Arrange a follow-up visit in one month to reassess before the sleep study. - Plan a titration study in 6 to 8 weeks to determine the most effective device level.

## 2024-06-10 NOTE — Patient Instructions (Signed)
 We lowered you to 1.8 V= level 6  Changed range from 1.3-2.3 V  Inspire titration study in feb after readiness check in [place]

## 2024-06-21 ENCOUNTER — Other Ambulatory Visit: Payer: Self-pay

## 2024-06-21 DIAGNOSIS — E559 Vitamin D deficiency, unspecified: Secondary | ICD-10-CM

## 2024-06-27 ENCOUNTER — Other Ambulatory Visit: Payer: Self-pay | Admitting: Internal Medicine

## 2024-06-27 DIAGNOSIS — I739 Peripheral vascular disease, unspecified: Secondary | ICD-10-CM

## 2024-06-29 ENCOUNTER — Ambulatory Visit

## 2024-06-29 VITALS — BP 115/74 | HR 108 | Ht 78.0 in | Wt 277.0 lb

## 2024-06-29 DIAGNOSIS — I2510 Atherosclerotic heart disease of native coronary artery without angina pectoris: Secondary | ICD-10-CM

## 2024-06-29 DIAGNOSIS — E66811 Obesity, class 1: Secondary | ICD-10-CM | POA: Diagnosis not present

## 2024-06-29 DIAGNOSIS — G6289 Other specified polyneuropathies: Secondary | ICD-10-CM

## 2024-06-29 DIAGNOSIS — E538 Deficiency of other specified B group vitamins: Secondary | ICD-10-CM

## 2024-06-29 DIAGNOSIS — I2584 Coronary atherosclerosis due to calcified coronary lesion: Secondary | ICD-10-CM | POA: Diagnosis not present

## 2024-06-29 DIAGNOSIS — G629 Polyneuropathy, unspecified: Secondary | ICD-10-CM | POA: Diagnosis not present

## 2024-06-29 DIAGNOSIS — F5101 Primary insomnia: Secondary | ICD-10-CM

## 2024-06-29 DIAGNOSIS — I700 Atherosclerosis of aorta: Secondary | ICD-10-CM | POA: Diagnosis not present

## 2024-06-29 DIAGNOSIS — I739 Peripheral vascular disease, unspecified: Secondary | ICD-10-CM

## 2024-06-29 DIAGNOSIS — G4733 Obstructive sleep apnea (adult) (pediatric): Secondary | ICD-10-CM

## 2024-06-29 MED ORDER — WEGOVY 0.25 MG/0.5ML ~~LOC~~ SOAJ: 0.2500 mg | SUBCUTANEOUS | 1 refills | Status: AC

## 2024-06-29 MED ORDER — MIRTAZAPINE 7.5 MG PO TABS: 7.5000 mg | ORAL_TABLET | Freq: Every day | ORAL | 5 refills | Status: AC

## 2024-06-29 MED ORDER — GABAPENTIN 300 MG PO CAPS: 600.0000 mg | ORAL_CAPSULE | Freq: Three times a day (TID) | ORAL | 1 refills | Status: DC

## 2024-06-29 MED ORDER — ALBUTEROL SULFATE HFA 108 (90 BASE) MCG/ACT IN AERS: INHALATION_SPRAY | RESPIRATORY_TRACT | 2 refills | Status: AC

## 2024-06-29 NOTE — Progress Notes (Signed)
 "  Established Patient Office Visit  Subjective   Patient ID: Timothy Nielsen, male    DOB: 1970-09-18  Age: 54 y.o. MRN: [id number]  Chief Complaint  Patient presents with   Medical Management of Chronic Issues    Follow up/ Discuss his dose of gabapentin  needs it upped or something stronger     HPI Discussed the use of AI scribe software for clinical note transcription with the patient, who gave verbal consent to proceed.  History of Present Illness    Timothy Nielsen is a 54 year old male with peripheral arterial disease and sleep apnea who presents with leg pain and sleep disturbances.  Lower extremity pain - Persistent leg pain despite increasing gabapentin  dose - Pain worsens with activity, especially walking - Pain radiates up the calves - Right leg is more painful than the left - History of procedures on right leg including cleaning and stenting for blockages - Currently on medium dose gabapentin   Sleep disturbances - Difficulty falling asleep with racing thoughts - Requires TV on to help zone out - Feels drained and tired - Inspire device implanted for sleep apnea, requires further adjustment - Over-the-counter sleep aids have been ineffective  Nutritional deficiencies - History of low B12 levels, previously treated with injections - Concerned about current B12 levels - Eating more fruit, especially oranges, to improve health - History of slight anemia  Obesity and weight management - Overweight - Attempting to reduce intake of unhealthy foods and drinks, such as [employer] - No noticeable change in weight - Interested in exploring weight loss options due to impact on legs and overall health  Dental issues - Recent dental work in preparation for dentures - Insurance denied coverage due to insufficient x-rays - Unaware of abscess across teeth until dental visit  Respiratory symptoms - Recent cold in chest attributed to temperature changes and underlying lung  condition - No head cold or flu recently     Patient Active Problem List   Diagnosis Date Noted   Primary insomnia 06/29/2024   Rash and other nonspecific skin eruption 04/18/2024   Intolerance of continuous positive airway pressure (CPAP) ventilation 03/15/2024   Obesity, Class I, BMI 30-34.9 03/15/2024   Peripheral neuropathy 11/14/2023   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/2023   OSA (obstructive sleep apnea) 11/03/2023   Claudication in peripheral vascular disease 10/02/2023   Osteoarthritis of left knee 08/25/2023   Anxiety and depression 03/25/2023   Coronary artery calcification of native artery 03/25/2023   Right upper quadrant abdominal pain 10/29/2022   Vitamin D  deficiency 07/31/2022   Callus of foot 07/31/2022   Screening for colon cancer 06/26/2022   B12 deficiency 06/26/2022   HLD (hyperlipidemia) 06/26/2022   Peripheral arterial disease 09/11/2021   Tobacco use disorder 11/21/2016   GERD (gastroesophageal reflux disease) 11/21/2016   Asthma 11/21/2016   Aortic atherosclerosis 11/21/2016   Essential hypertension 09/29/2014    ROS    Objective:     BP 115/74   Pulse (!) 108   Ht 6' 6 (1.981 m)   Wt 277 lb (125.6 kg)   SpO2 97%   BMI 32.01 kg/m  BP Readings from Last 3 Encounters:  06/29/24 115/74  06/10/24 (!) 177/77  05/18/24 (!) 110/58   Wt Readings from Last 3 Encounters:  06/29/24 277 lb (125.6 kg)  06/10/24 278 lb 6.4 oz (126.3 kg)  05/18/24 281 lb 0.6 oz (127.5 kg)   Physical Exam Vitals and nursing note reviewed.  Constitutional:  Appearance: Normal appearance. He is obese.  HENT:     Head: Normocephalic.     Right Ear: Tympanic membrane, ear canal and external ear normal.     Left Ear: Tympanic membrane, ear canal and external ear normal.     Nose: Nose normal.     Mouth/Throat:     Mouth: Mucous membranes are moist.     Pharynx: Oropharynx is clear.  Eyes:     Extraocular Movements: Extraocular movements intact.     Pupils:  Pupils are equal, round, and reactive to light.  Cardiovascular:     Rate and Rhythm: Normal rate and regular rhythm.  Pulmonary:     Effort: Pulmonary effort is normal.     Breath sounds: Normal breath sounds.  Abdominal:     General: Abdomen is flat. Bowel sounds are normal.     Palpations: Abdomen is soft.  Musculoskeletal:        General: Normal range of motion.     Cervical back: Normal range of motion and neck supple.  Skin:    General: Skin is warm and dry.  Neurological:     Mental Status: He is alert and oriented to person, place, and time.  Psychiatric:        Mood and Affect: Mood normal.        Thought Content: Thought content normal.          The ASCVD Risk score (Arnett DK, et al., 2019) failed to calculate for the following reasons:   The valid total cholesterol range is 130 to 320 mg/dL    Assessment & Plan:   Problem List Items Addressed This Visit       Cardiovascular and Mediastinum   Aortic atherosclerosis   Considering weight loss to improve cardiovascular health. - Submitted prescription for Wegovy  to pharmacy and checked insurance coverage.      Peripheral arterial disease   Chronic condition with claudication, managed with previous angioplasty and stenting. Recent ultrasound showed stable stents. - Increased gabapentin  to two capsules three times a day. - Consider switching to Lyrica if gabapentin  is ineffective.      Coronary artery calcification of native artery   Underwent LHC in October 2024, which revealed multivessel CAD.  Managed with Plavix . Refill provided today.      Relevant Medications   semaglutide -weight management (WEGOVY ) 0.25 MG/0.5ML SOAJ SQ injection     Respiratory   OSA (obstructive sleep apnea)   Managed with Inspire device, but persistent fatigue and discomfort due to high settings. Follow-up and sleep study planned. - Continue follow-up with sleep specialist. - Proceed with scheduled sleep study in March.       Relevant Medications   semaglutide -weight management (WEGOVY ) 0.25 MG/0.5ML SOAJ SQ injection     Nervous and Auditory   Peripheral neuropathy - Primary   Relevant Medications   gabapentin  (NEURONTIN ) 300 MG capsule   semaglutide -weight management (WEGOVY ) 0.25 MG/0.5ML SOAJ SQ injection   mirtazapine  (REMERON ) 7.5 MG tablet   Other Relevant Orders   CBC with Differential/Platelet (Completed)   Basic Metabolic Panel (BMET) (Completed)   B12 and Folate Panel (Completed)     Other   B12 deficiency   Reports fatigue, concerned about B12 levels. - Ordered B12 level test. - Ordered CBC to assess for anemia.      Relevant Orders   B12 and Folate Panel (Completed)   Obesity, Class I, BMI 30-34.9 (Chronic)   Contributing to health issues, interested in weight loss options. -  Submitted prescription for Wegovy  to pharmacy and checked insurance coverage. - Encouraged dietary modifications and weight loss efforts.      Primary insomnia   Chronic insomnia with difficulty initiating sleep, interested in pharmacological intervention. - Prescribed Remeron  at the lowest dose, to be taken 30 minutes before bedtime. - Advised to adjust dose as needed, with the option to double the dose if ineffective.      Relevant Medications   mirtazapine  (REMERON ) 7.5 MG tablet    No follow-ups on file.    Leita Longs, FNP  "

## 2024-06-30 LAB — CBC WITH DIFFERENTIAL/PLATELET
Basophils Absolute: 0.1 x10E3/uL (ref 0.0–0.2)
Basos: 1 %
EOS (ABSOLUTE): 0.2 x10E3/uL (ref 0.0–0.4)
Eos: 2 %
Hematocrit: 41.7 % (ref 37.5–51.0)
Hemoglobin: 14.2 g/dL (ref 13.0–17.7)
Immature Grans (Abs): 0 x10E3/uL (ref 0.0–0.1)
Immature Granulocytes: 0 %
Lymphocytes Absolute: 2 x10E3/uL (ref 0.7–3.1)
Lymphs: 23 %
MCH: 31 pg (ref 26.6–33.0)
MCHC: 34.1 g/dL (ref 31.5–35.7)
MCV: 91 fL (ref 79–97)
Monocytes Absolute: 0.8 x10E3/uL (ref 0.1–0.9)
Monocytes: 9 %
Neutrophils Absolute: 6 x10E3/uL (ref 1.4–7.0)
Neutrophils: 65 %
Platelets: 244 x10E3/uL (ref 150–450)
RBC: 4.58 x10E6/uL (ref 4.14–5.80)
RDW: 12.6 % (ref 11.6–15.4)
WBC: 9 x10E3/uL (ref 3.4–10.8)

## 2024-06-30 LAB — BASIC METABOLIC PANEL WITH GFR
BUN/Creatinine Ratio: 10 (ref 9–20)
BUN: 11 mg/dL (ref 6–24)
CO2: 23 mmol/L (ref 20–29)
Calcium: 9.7 mg/dL (ref 8.7–10.2)
Chloride: 93 mmol/L — ABNORMAL LOW (ref 96–106)
Creatinine, Ser: 1.08 mg/dL (ref 0.76–1.27)
Glucose: 200 mg/dL — ABNORMAL HIGH (ref 70–99)
Potassium: 3.9 mmol/L (ref 3.5–5.2)
Sodium: 134 mmol/L (ref 134–144)
eGFR: 81 mL/min/1.73

## 2024-06-30 LAB — B12 AND FOLATE PANEL
Folate: 7 ng/mL
Vitamin B-12: 456 pg/mL (ref 232–1245)

## 2024-07-01 NOTE — Assessment & Plan Note (Signed)
 Chronic insomnia with difficulty initiating sleep, interested in pharmacological intervention. - Prescribed Remeron  at the lowest dose, to be taken 30 minutes before bedtime. - Advised to adjust dose as needed, with the option to double the dose if ineffective.

## 2024-07-01 NOTE — Assessment & Plan Note (Signed)
 Considering weight loss to improve cardiovascular health. - Submitted prescription for Wegovy  to pharmacy and checked insurance coverage.

## 2024-07-01 NOTE — Assessment & Plan Note (Signed)
 Contributing to health issues, interested in weight loss options. - Submitted prescription for Wegovy  to pharmacy and checked insurance coverage. - Encouraged dietary modifications and weight loss efforts.

## 2024-07-01 NOTE — Assessment & Plan Note (Signed)
 Underwent LHC in October 2024, which revealed multivessel CAD.  Managed with Plavix . Refill provided today.

## 2024-07-01 NOTE — Assessment & Plan Note (Signed)
 Reports fatigue, concerned about B12 levels. - Ordered B12 level test. - Ordered CBC to assess for anemia.

## 2024-07-01 NOTE — Assessment & Plan Note (Signed)
 Managed with Inspire device, but persistent fatigue and discomfort due to high settings. Follow-up and sleep study planned. - Continue follow-up with sleep specialist. - Proceed with scheduled sleep study in March.

## 2024-07-01 NOTE — Assessment & Plan Note (Signed)
 Chronic condition with claudication, managed with previous angioplasty and stenting. Recent ultrasound showed stable stents. - Increased gabapentin  to two capsules three times a day. - Consider switching to Lyrica if gabapentin  is ineffective.

## 2024-07-03 ENCOUNTER — Other Ambulatory Visit: Payer: Self-pay | Admitting: Internal Medicine

## 2024-07-03 DIAGNOSIS — I10 Essential (primary) hypertension: Secondary | ICD-10-CM

## 2024-07-06 ENCOUNTER — Telehealth: Payer: Self-pay

## 2024-07-06 NOTE — Telephone Encounter (Signed)
Awaiting provider to review results.

## 2024-07-06 NOTE — Telephone Encounter (Signed)
 Copied from CRM [id number]. Topic: Clinical - Medication Prior Auth >> Jul 06, 2024 11:48 AM Montie POUR wrote: Reason for CRM:  Pharmacy needs prior authorization for semaglutide -weight management (WEGOVY ) 0.25 MG/0.5ML SOAJ SQ injection. Please send last office visit notes with prior authorization. Please let Timothy Nielsen know when this is completed. His number it [phone].

## 2024-07-06 NOTE — Telephone Encounter (Signed)
 Copied from CRM [id number]. Topic: Clinical - Lab/Test Results >> Jul 06, 2024 11:52 AM Montie POUR wrote: Reason for CRM:  Please call Mr. Chiarelli at  [phone] to review his lab results after FNP Huenink review results.

## 2024-07-07 ENCOUNTER — Other Ambulatory Visit (HOSPITAL_COMMUNITY): Payer: Self-pay

## 2024-07-07 ENCOUNTER — Telehealth: Payer: Self-pay | Admitting: Pharmacy Technician

## 2024-07-07 NOTE — Telephone Encounter (Signed)
 Pharmacy Patient Advocate Encounter   Received notification from Pt Calls Messages that prior authorization for Wegovy  0.25MG /0.5ML auto-injectors is required/requested.   Insurance verification completed.   The patient is insured through [employer].   Per test claim: PA required; PA submitted to above mentioned insurance via Latent Key/confirmation #/EOC [id number] Status is pending

## 2024-07-07 NOTE — Telephone Encounter (Signed)
 PA request has been Started. New Encounter has been or will be created for follow up. For additional info see Pharmacy Prior Auth telephone encounter from 07/07/2024.

## 2024-07-07 NOTE — Telephone Encounter (Signed)
 Pharmacy Patient Advocate Encounter  Received notification from [facility] that Prior Authorization for Wegovy  0.25MG /0.5ML auto-injectors has been APPROVED from 07/07/2024 to 06/23/2098. Unable to obtain price due to refill too soon rejection, last fill date 07/07/2024 next available fill date02/08/2024.   PA #/Case ID/Reference #: [id number]

## 2024-07-13 ENCOUNTER — Other Ambulatory Visit: Payer: Self-pay

## 2024-07-22 ENCOUNTER — Ambulatory Visit (HOSPITAL_BASED_OUTPATIENT_CLINIC_OR_DEPARTMENT_OTHER): Admitting: Pulmonary Disease

## 2024-07-28 ENCOUNTER — Other Ambulatory Visit: Payer: Self-pay

## 2024-07-28 DIAGNOSIS — G629 Polyneuropathy, unspecified: Secondary | ICD-10-CM

## 2024-08-05 ENCOUNTER — Ambulatory Visit (HOSPITAL_BASED_OUTPATIENT_CLINIC_OR_DEPARTMENT_OTHER): Admitting: Pulmonary Disease

## 2024-08-22 ENCOUNTER — Ambulatory Visit (HOSPITAL_BASED_OUTPATIENT_CLINIC_OR_DEPARTMENT_OTHER): Admitting: Pulmonary Disease

## 2024-09-06 ENCOUNTER — Ambulatory Visit (HOSPITAL_BASED_OUTPATIENT_CLINIC_OR_DEPARTMENT_OTHER): Admitting: Pulmonary Disease

## 2024-09-07 ENCOUNTER — Ambulatory Visit: Admitting: Cardiology

## 2025-05-23 ENCOUNTER — Ambulatory Visit
